# Patient Record
Sex: Female | Born: 1965
Health system: Southern US, Community
[De-identification: ages and names within clinical notes are randomized; demographics above are authoritative.]

## PROBLEM LIST (undated history)

## (undated) DIAGNOSIS — M1990 Unspecified osteoarthritis, unspecified site: Secondary | ICD-10-CM

## (undated) DIAGNOSIS — G510 Bell's palsy: Secondary | ICD-10-CM

## (undated) DIAGNOSIS — E669 Obesity, unspecified: Secondary | ICD-10-CM

## (undated) DIAGNOSIS — Z87442 Personal history of urinary calculi: Secondary | ICD-10-CM

## (undated) DIAGNOSIS — N189 Chronic kidney disease, unspecified: Secondary | ICD-10-CM

## (undated) DIAGNOSIS — K589 Irritable bowel syndrome without diarrhea: Secondary | ICD-10-CM

## (undated) DIAGNOSIS — D690 Allergic purpura: Secondary | ICD-10-CM

## (undated) DIAGNOSIS — Z8489 Family history of other specified conditions: Secondary | ICD-10-CM

## (undated) DIAGNOSIS — I10 Essential (primary) hypertension: Secondary | ICD-10-CM

## (undated) DIAGNOSIS — Z87440 Personal history of urinary (tract) infections: Secondary | ICD-10-CM

## (undated) DIAGNOSIS — G4730 Sleep apnea, unspecified: Secondary | ICD-10-CM

## (undated) HISTORY — DX: Personal history of urinary calculi: Z87.442

## (undated) HISTORY — DX: Essential (primary) hypertension: I10

## (undated) HISTORY — DX: Chronic kidney disease, unspecified: N18.9

## (undated) HISTORY — DX: Personal history of urinary (tract) infections: Z87.440

## (undated) HISTORY — DX: Obesity, unspecified: E66.9

## (undated) HISTORY — DX: Irritable bowel syndrome, unspecified: K58.9

## (undated) HISTORY — DX: Allergic purpura: D69.0

## (undated) HISTORY — PX: ACHILLES TENDON SURGERY: SHX542

## (undated) HISTORY — DX: Unspecified osteoarthritis, unspecified site: M19.90

## (undated) HISTORY — DX: Bell's palsy: G51.0

## (undated) HISTORY — PX: TONSILECTOMY, ADENOIDECTOMY, BILATERAL MYRINGOTOMY AND TUBES: SHX2538

## (undated) HISTORY — PX: KNEE SURGERY: SHX244

## (undated) HISTORY — PX: TONSILLECTOMY: SUR1361

---

## 1994-10-24 HISTORY — PX: TUBAL LIGATION: SHX77

## 1998-10-24 HISTORY — PX: NECK SURGERY: SHX720

## 1998-12-29 ENCOUNTER — Encounter: Payer: Self-pay | Admitting: Neurosurgery

## 1998-12-31 ENCOUNTER — Inpatient Hospital Stay (HOSPITAL_COMMUNITY): Admission: RE | Admit: 1998-12-31 | Discharge: 1999-01-01 | Payer: Self-pay | Admitting: Neurosurgery

## 1999-05-12 ENCOUNTER — Other Ambulatory Visit: Admission: RE | Admit: 1999-05-12 | Discharge: 1999-05-12 | Payer: Self-pay | Admitting: *Deleted

## 2001-08-28 ENCOUNTER — Other Ambulatory Visit: Admission: RE | Admit: 2001-08-28 | Discharge: 2001-08-28 | Payer: Self-pay | Admitting: Obstetrics and Gynecology

## 2001-10-24 HISTORY — PX: ABDOMINAL HYSTERECTOMY: SHX81

## 2001-11-14 ENCOUNTER — Encounter: Payer: Self-pay | Admitting: Internal Medicine

## 2001-11-14 ENCOUNTER — Emergency Department (HOSPITAL_COMMUNITY): Admission: EM | Admit: 2001-11-14 | Discharge: 2001-11-14 | Payer: Self-pay | Admitting: Internal Medicine

## 2001-12-10 ENCOUNTER — Emergency Department (HOSPITAL_COMMUNITY): Admission: EM | Admit: 2001-12-10 | Discharge: 2001-12-10 | Payer: Self-pay | Admitting: *Deleted

## 2002-03-07 ENCOUNTER — Observation Stay (HOSPITAL_COMMUNITY): Admission: RE | Admit: 2002-03-07 | Discharge: 2002-03-08 | Payer: Self-pay | Admitting: Obstetrics and Gynecology

## 2002-03-07 ENCOUNTER — Encounter (INDEPENDENT_AMBULATORY_CARE_PROVIDER_SITE_OTHER): Payer: Self-pay

## 2002-12-17 ENCOUNTER — Other Ambulatory Visit: Admission: RE | Admit: 2002-12-17 | Discharge: 2002-12-17 | Payer: Self-pay | Admitting: Obstetrics and Gynecology

## 2003-12-30 ENCOUNTER — Ambulatory Visit (HOSPITAL_COMMUNITY): Admission: RE | Admit: 2003-12-30 | Discharge: 2003-12-30 | Payer: Self-pay | Admitting: Family Medicine

## 2004-11-01 ENCOUNTER — Other Ambulatory Visit: Admission: RE | Admit: 2004-11-01 | Discharge: 2004-11-01 | Payer: Self-pay | Admitting: Obstetrics and Gynecology

## 2005-05-05 ENCOUNTER — Ambulatory Visit (HOSPITAL_COMMUNITY): Admission: RE | Admit: 2005-05-05 | Discharge: 2005-05-05 | Payer: Self-pay | Admitting: Family Medicine

## 2005-09-06 ENCOUNTER — Emergency Department (HOSPITAL_COMMUNITY): Admission: EM | Admit: 2005-09-06 | Discharge: 2005-09-06 | Payer: Self-pay | Admitting: Emergency Medicine

## 2006-09-15 ENCOUNTER — Ambulatory Visit (HOSPITAL_COMMUNITY): Admission: RE | Admit: 2006-09-15 | Discharge: 2006-09-15 | Payer: Self-pay | Admitting: Family Medicine

## 2006-12-09 ENCOUNTER — Emergency Department (HOSPITAL_COMMUNITY): Admission: EM | Admit: 2006-12-09 | Discharge: 2006-12-09 | Payer: Self-pay | Admitting: Emergency Medicine

## 2008-01-21 ENCOUNTER — Inpatient Hospital Stay (HOSPITAL_COMMUNITY): Admission: EM | Admit: 2008-01-21 | Discharge: 2008-01-22 | Payer: Self-pay | Admitting: Emergency Medicine

## 2008-01-21 ENCOUNTER — Ambulatory Visit: Payer: Self-pay | Admitting: Cardiology

## 2008-01-29 ENCOUNTER — Encounter: Payer: Self-pay | Admitting: Cardiology

## 2008-01-29 ENCOUNTER — Ambulatory Visit (HOSPITAL_COMMUNITY): Admission: RE | Admit: 2008-01-29 | Discharge: 2008-01-29 | Payer: Self-pay | Admitting: Cardiology

## 2008-02-12 ENCOUNTER — Ambulatory Visit: Payer: Self-pay | Admitting: Cardiology

## 2008-02-27 ENCOUNTER — Ambulatory Visit (HOSPITAL_COMMUNITY): Admission: RE | Admit: 2008-02-27 | Discharge: 2008-02-27 | Payer: Self-pay | Admitting: Cardiology

## 2008-05-08 ENCOUNTER — Emergency Department (HOSPITAL_COMMUNITY): Admission: EM | Admit: 2008-05-08 | Discharge: 2008-05-08 | Payer: Self-pay | Admitting: Emergency Medicine

## 2009-04-21 ENCOUNTER — Encounter: Admission: RE | Admit: 2009-04-21 | Discharge: 2009-04-21 | Payer: Self-pay | Admitting: Neurosurgery

## 2009-06-06 ENCOUNTER — Emergency Department (HOSPITAL_COMMUNITY): Admission: EM | Admit: 2009-06-06 | Discharge: 2009-06-06 | Payer: Self-pay | Admitting: Emergency Medicine

## 2009-08-23 ENCOUNTER — Emergency Department (HOSPITAL_COMMUNITY): Admission: EM | Admit: 2009-08-23 | Discharge: 2009-08-23 | Payer: Self-pay | Admitting: Emergency Medicine

## 2009-12-01 ENCOUNTER — Encounter: Admission: RE | Admit: 2009-12-01 | Discharge: 2009-12-01 | Payer: Self-pay | Admitting: Obstetrics and Gynecology

## 2010-05-25 ENCOUNTER — Ambulatory Visit (HOSPITAL_COMMUNITY): Admission: RE | Admit: 2010-05-25 | Discharge: 2010-05-25 | Payer: Self-pay | Admitting: Physician Assistant

## 2010-06-29 ENCOUNTER — Ambulatory Visit (HOSPITAL_COMMUNITY): Admission: RE | Admit: 2010-06-29 | Discharge: 2010-06-29 | Payer: Self-pay | Admitting: Family Medicine

## 2010-07-01 ENCOUNTER — Ambulatory Visit (HOSPITAL_COMMUNITY): Admission: RE | Admit: 2010-07-01 | Discharge: 2010-07-01 | Payer: Self-pay | Admitting: Family Medicine

## 2010-11-19 ENCOUNTER — Emergency Department (HOSPITAL_COMMUNITY)
Admission: EM | Admit: 2010-11-19 | Discharge: 2010-11-20 | Payer: Self-pay | Source: Home / Self Care | Admitting: Emergency Medicine

## 2010-11-19 LAB — CBC
HCT: 42 % (ref 36.0–46.0)
MCV: 88.6 fL (ref 78.0–100.0)
Platelets: 317 10*3/uL (ref 150–400)
RBC: 4.74 MIL/uL (ref 3.87–5.11)
WBC: 12.4 10*3/uL — ABNORMAL HIGH (ref 4.0–10.5)

## 2010-11-19 LAB — DIFFERENTIAL
Basophils Absolute: 0.1 10*3/uL (ref 0.0–0.1)
Lymphocytes Relative: 28 % (ref 12–46)
Lymphs Abs: 3.5 10*3/uL (ref 0.7–4.0)
Neutrophils Relative %: 62 % (ref 43–77)

## 2010-11-19 LAB — COMPREHENSIVE METABOLIC PANEL
BUN: 12 mg/dL (ref 6–23)
CO2: 26 mEq/L (ref 19–32)
Calcium: 9.2 mg/dL (ref 8.4–10.5)
Creatinine, Ser: 0.71 mg/dL (ref 0.4–1.2)
GFR calc non Af Amer: >60 mL/min (ref >60)
Glucose, Bld: 122 mg/dL — ABNORMAL HIGH (ref 70–99)

## 2010-11-19 LAB — URINALYSIS, ROUTINE W REFLEX MICROSCOPIC
Bilirubin Urine: NEGATIVE
Nitrite: NEGATIVE
Specific Gravity, Urine: 1.014 (ref 1.005–1.030)
pH: 6 (ref 5.0–8.0)

## 2010-11-19 LAB — PROTIME-INR: Prothrombin Time: 13 seconds (ref 11.6–15.2)

## 2010-11-19 LAB — URINE MICROSCOPIC-ADD ON

## 2010-11-26 ENCOUNTER — Observation Stay (HOSPITAL_COMMUNITY)
Admission: EM | Admit: 2010-11-26 | Discharge: 2010-11-28 | DRG: 813 | Disposition: A | Discharge status: Home / Self Care | Payer: Medicare HMO | Attending: Internal Medicine | Admitting: Internal Medicine

## 2010-11-26 ENCOUNTER — Emergency Department (HOSPITAL_COMMUNITY): Payer: Medicare HMO

## 2010-11-26 DIAGNOSIS — Z79899 Other long term (current) drug therapy: Secondary | ICD-10-CM | POA: Insufficient documentation

## 2010-11-26 DIAGNOSIS — R197 Diarrhea, unspecified: Secondary | ICD-10-CM | POA: Insufficient documentation

## 2010-11-26 DIAGNOSIS — Z87440 Personal history of urinary (tract) infections: Secondary | ICD-10-CM

## 2010-11-26 DIAGNOSIS — G2581 Restless legs syndrome: Secondary | ICD-10-CM | POA: Diagnosis present

## 2010-11-26 DIAGNOSIS — K921 Melena: Secondary | ICD-10-CM | POA: Diagnosis present

## 2010-11-26 DIAGNOSIS — D72829 Elevated white blood cell count, unspecified: Secondary | ICD-10-CM | POA: Diagnosis present

## 2010-11-26 DIAGNOSIS — E669 Obesity, unspecified: Secondary | ICD-10-CM | POA: Diagnosis present

## 2010-11-26 DIAGNOSIS — T380X5A Adverse effect of glucocorticoids and synthetic analogues, initial encounter: Secondary | ICD-10-CM | POA: Diagnosis present

## 2010-11-26 DIAGNOSIS — R109 Unspecified abdominal pain: Secondary | ICD-10-CM | POA: Insufficient documentation

## 2010-11-26 DIAGNOSIS — D690 Allergic purpura: Principal | ICD-10-CM | POA: Diagnosis present

## 2010-11-26 DIAGNOSIS — I10 Essential (primary) hypertension: Secondary | ICD-10-CM | POA: Diagnosis present

## 2010-11-26 DIAGNOSIS — Z87442 Personal history of urinary calculi: Secondary | ICD-10-CM

## 2010-11-26 DIAGNOSIS — Z880 Allergy status to penicillin: Secondary | ICD-10-CM

## 2010-11-26 DIAGNOSIS — M1990 Unspecified osteoarthritis, unspecified site: Secondary | ICD-10-CM | POA: Diagnosis present

## 2010-11-26 DIAGNOSIS — F411 Generalized anxiety disorder: Secondary | ICD-10-CM | POA: Diagnosis present

## 2010-11-26 LAB — DIFFERENTIAL
Basophils Absolute: 0 10*3/uL (ref 0.0–0.1)
Basophils Relative: 0 % (ref 0–1)
Lymphocytes Relative: 15 % (ref 12–46)
Neutro Abs: 15.6 10*3/uL — ABNORMAL HIGH (ref 1.7–7.7)
Neutrophils Relative %: 78 % — ABNORMAL HIGH (ref 43–77)

## 2010-11-26 LAB — POCT I-STAT, CHEM 8
Calcium, Ion: 1.13 mmol/L (ref 1.12–1.32)
Creatinine, Ser: 0.8 mg/dL (ref 0.4–1.2)
Glucose, Bld: 107 mg/dL — ABNORMAL HIGH (ref 70–99)
Hemoglobin: 16.3 g/dL — ABNORMAL HIGH (ref 12.0–15.0)
Sodium: 137 mEq/L (ref 135–145)
TCO2: 23 mmol/L (ref 0–100)

## 2010-11-26 LAB — HEPATIC FUNCTION PANEL
AST: 14 U/L (ref 0–37)
Albumin: 3.5 g/dL (ref 3.5–5.2)

## 2010-11-26 LAB — URINALYSIS, ROUTINE W REFLEX MICROSCOPIC
Leukocytes, UA: NEGATIVE
Protein, ur: 100 mg/dL — AB
Specific Gravity, Urine: 1.026 (ref 1.005–1.030)
Urine Glucose, Fasting: >1000 mg/dL — AB
Urobilinogen, UA: 0.2 mg/dL (ref 0.0–1.0)

## 2010-11-26 LAB — URINE MICROSCOPIC-ADD ON

## 2010-11-26 LAB — CBC
HCT: 44.1 % (ref 36.0–46.0)
Hemoglobin: 15.2 g/dL — ABNORMAL HIGH (ref 12.0–15.0)
RBC: 5.06 MIL/uL (ref 3.87–5.11)
WBC: 19.9 10*3/uL — ABNORMAL HIGH (ref 4.0–10.5)

## 2010-11-26 LAB — PROTIME-INR
INR: 0.99 (ref 0.00–1.49)
Prothrombin Time: 13.3 seconds (ref 11.6–15.2)

## 2010-11-26 LAB — APTT: aPTT: 29 seconds (ref 24–37)

## 2010-11-27 LAB — CBC
HCT: 44.7 % (ref 36.0–46.0)
Hemoglobin: 15.2 g/dL — ABNORMAL HIGH (ref 12.0–15.0)
MCH: 29.7 pg (ref 26.0–34.0)
MCHC: 34 g/dL (ref 30.0–36.0)
MCV: 87.5 fL (ref 78.0–100.0)
RDW: 13.9 % (ref 11.5–15.5)

## 2010-11-27 LAB — DIFFERENTIAL
Basophils Absolute: 0 10*3/uL (ref 0.0–0.1)
Eosinophils Relative: 0 % (ref 0–5)
Lymphocytes Relative: 10 % — ABNORMAL LOW (ref 12–46)
Monocytes Absolute: 0.6 10*3/uL (ref 0.1–1.0)
Monocytes Relative: 2 % — ABNORMAL LOW (ref 3–12)
Neutro Abs: 20.9 10*3/uL — ABNORMAL HIGH (ref 1.7–7.7)

## 2010-11-27 LAB — COMPREHENSIVE METABOLIC PANEL
ALT: 24 U/L (ref 0–35)
BUN: 16 mg/dL (ref 6–23)
CO2: 22 mEq/L (ref 19–32)
Calcium: 9.2 mg/dL (ref 8.4–10.5)
Creatinine, Ser: 0.77 mg/dL (ref 0.4–1.2)
GFR calc non Af Amer: >60 mL/min (ref >60)
Glucose, Bld: 153 mg/dL — ABNORMAL HIGH (ref 70–99)
Total Protein: 7.2 g/dL (ref 6.0–8.3)

## 2010-11-27 LAB — C-REACTIVE PROTEIN: CRP: 1.1 mg/dL — ABNORMAL HIGH (ref <0.6)

## 2010-11-27 LAB — GLUCOSE, CAPILLARY
Glucose-Capillary: 166 mg/dL — ABNORMAL HIGH (ref 70–99)
Glucose-Capillary: 97 mg/dL (ref 70–99)
Glucose-Capillary: 205 mg/dL — ABNORMAL HIGH (ref 70–99)

## 2010-11-27 LAB — PHOSPHORUS: Phosphorus: 3.6 mg/dL (ref 2.3–4.6)

## 2010-11-27 LAB — TSH: TSH: 1.738 u[IU]/mL (ref 0.350–4.500)

## 2010-11-28 LAB — URINE CULTURE: Colony Count: 35000

## 2010-11-28 LAB — GLUCOSE, CAPILLARY: Glucose-Capillary: 213 mg/dL — ABNORMAL HIGH (ref 70–99)

## 2010-12-01 LAB — CRYOGLOBULIN

## 2010-12-05 NOTE — H&P (Addendum)
Victoria Williams, Victoria Williams                ACCOUNT NO.:  [id number]  MEDICAL RECORD NO.:  [id number]           PATIENT TYPE:  I  LOCATION:  1305                         FACILITY:  [facility]  PHYSICIAN:  Massie Maroon, MD        DATE OF BIRTH:  27-Aug-1966  DATE OF ADMISSION:  11/26/2010 DATE OF DISCHARGE:                             HISTORY & PHYSICAL   PRIMARY CARE PHYSICIAN:  Corrie Mckusick, MD, Family Practice.  NEPHROLOGIST:  Duke Salvia. Eliott Nine, MD  DERMATOLOGIST:  Leticia Clas, MD, in [place].  CHIEF COMPLAINT:  Rash on my legs and arms.  HISTORY OF PRESENT ILLNESS:  The patient is a 45 year old female who states "I was advised to come to the ER because my dermatologist and my family doctor decided I need to be further evaluated because I have the skin problems."  The patient states that her symptoms started about 4 weeks ago.  The patient states "I started off with cold symptoms with a stuffy nose, then about a week and half later my stomach started aching, I started having cramps, noticed within the next 2 weeks I have started developing the skin rash on my lower extremities.  I did go see Dr. Phillips Odor about a week and half ago and at that point Dr. Phillips Odor started me on steroids and referred me to dermatologist, Dr. Margo Aye, in [place].  I saw Dr. Margo Aye on Tuesday, February 1st, and Dr. Margo Aye did a biopsy on me, kept me on steroids, and sent me home.  At that time, I noticed that I had some swelling in my extremities.  After being on the steroids for about approximately 2 weeks, my swelling in the extremities seems to improve.  The dermatologist also did a biopsy twice and told me that I had diagnosis of Henoch-Schonlein purpura. I gave Z-PAK and mostly for a 5-day course.  On Wednesday, the patient states that she went to see her nephrologist, Dr. Eliott Nine, because she has a history of pyelonephritis and she always has blood and protein in her urine. The patient states that she also  had symptoms of fever, chills, light headache, she had nasal stuffy nose.  She also notes she has some nasal bleeding when she blow her nose, however, that has resolved.  She also states that she has some bright red blood per rectum when she wipes, however, she states she has also been on [facility] for approximately a couple of months.  The patient states "I think my symptoms were improving, however, after speaking with my doctors and they felt it was best for me to come to the hospital because they felt as though that I could be better worked up here at the hospital.  The patient is alert and oriented x3.  Currently, denies any chest pain, any shortness of breath, any dizziness, any urinary burning, any frequency. The patient also denies any heat, cold, intolerance to self.  The patient also reports that she has a history of osteoarthritis and she was having joint swelling and joint pain and that is why she was on MOBIC.  PAST MEDICAL  HISTORY:   1. Hypertension. 2. Kidney stones x6. 3. Wrist injury. 4. Anxiety. 5. Pyelonephritis since age 45. 6. Obesity. 7. Osteoarthritis. 8. Restless legs syndrome. 9. Incontinence. 10.Urinary tract infection.  SURGICAL HISTORY:  Back surgery, C-section, hysterectomy, right knee arthroscopy, tonsillectomy, and tubal ligation.  ALLERGIES: 1. SULFA, KEFLEX, SEPTRA unknown causes. 2. PENICILLIN causes hives. 3. LEVAQUIN causes swelling and shortness of breath.  CURRENT MEDICATIONS: 1. Azithromycin 250 mg, the patient has 1 dose left remaining. 2. Prednisone 20 mg 3 tablets daily. 3. Meloxicam 15 mg 1 tablet evert morning. 4. Vesicare 10 mg 1 tablet q.a.m. 5. Mirapex 0.25 mg at bedtime. 6. HCTZ 25 mg daily.  SOCIAL HISTORY:  The patient lives in [place].  She is married, with 3 children.  She denies any alcohol, drug, or tobacco abuse.  FAMILY HISTORY:  Mother has had 3 hip replacements, stroke.  Father died at age 72 of heart attack.  She  has 1 living sibling.  CODE STATUS:  Full code.  REVIEW OF SYSTEMS:  All systems have been reviewed and are negative, otherwise stated HPI.  PHYSICAL EXAMINATION:  VITAL SIGNS:  Temperature is 98.5, respirations 20, pulse 95, blood pressure 119/79, current weight 233.74 pounds, currently room air at 95%. GENERAL:  Very pleasant, 45 year old, obese female lying in bed, in no acute distress, answering questions appropriately. HEENT:  Head is atraumatic.  PERRLA.  Moist mucous membranes. NECK:  Supple.  No JVD.  No thyromegaly. LUNGS:  Lungs are clear to auscultation bilaterally.  No wheezing or rhonchi. HEART:  Regular rate and rhythm.  No heaves, no thrills noted. ABDOMEN:  Soft, obese, positive bowel sounds.  No guarding.  No tenderness noted.  The patient does have some petechiae noted in the lower quad regions. SKIN:  Warm and dry.  The patient does have some petechiae noted on the bilateral lower extremities and on the left upper extremity. EXTREMITIES:  Movement x4.  No edema noted. NEUROLOGIC/PSYCHIATRIC:  Alert and oriented x3.  Cranial nerves appear to be intact.  Muscle strength 3/5 throughout.  LABORATORY DATA:  Abdomen:  Acute abdomen. 1. No acute cardiopulmonary disease.  No acute intra-abdominal     findings. 2. No significant change from prior.  Urine microscopic is negative.     Urinalysis, moderate amount of blood, protein is 100, nitrites     negative, leukocytes negative.  Hepatic function is unremarkable.     PTT is 29, PT is 13.3, INR is 0.99.  WBC is 19.9, hemoglobin 15.2,     hematocrit 44.1, platelet is 443.  Urine pregnancy is negative.     Sodium is 137, potassium 4.1, chloride is 103, glucose 107, BUN 15,     creatinine 0.8.  ASSESSMENT AND PLAN: 1. A 45 year old female presenting with Henoch-Schonlein purpura     versus hypersensitivity vasculitis along with leukocytosis.  The     patient will be admitted to [facility] [place] [facility].  The patient's      leukocytosis is probably related to her steroid use.  Clinically,     the patient is asymptomatic and vital signs are stable, however, we     will continue to monitor via labs.  It is also known the patient     did have recurrent biopsies by dermatologist for the diagnosis of     Henoch-Schonlein purpura.  Therefore, we would check ESR,, CRP,     cryoglobulinemia, hepatitis panel, RA panel, c-ANCA, p-ANCA.  The     patient will continue  on her current regimen of steroids. The     patient will be hydrated with normal saline at 75 mL an hour. 2. Hematochezia.  We will heme check stools.  The patient's hemoglobin     is currently stable at 15.2.  We will continue to monitor via labs. 3. Hypertension.  The patient is currently stable on current regimen,     therefore we will continue her blood pressure medicines. 4. Restless legs syndrome.  The patient is currently on Mirapex.  We     will continue current regimen. 5. Obesity.  The patient's current weight is 233.7 pounds.     Prophylactically, the patient will be placed on SCDs. 6. The patient is a full code. 7. I spoke with Dr. Pearson Grippe regarding history physical, assessment,     and plan.  Dr. Pearson Grippe has also examined the patient and these     are his above findings.  The above interventions are embarked upon     and we will let the hospital course to determine further action.    ______________________________ Anice Paganini, NP   ______________________________ Massie Maroon, MD    IB/MEDQ  D:  11/27/2010  T:  11/27/2010  Job:  [id number]  Electronically Signed by Anice Paganini  on 12/02/2010 11:18:20 PM Electronically Signed by Pearson Grippe MD on 12/15/2010 01:30:13 PM

## 2010-12-07 NOTE — Discharge Summary (Signed)
Victoria Williams, APPLE                ACCOUNT NO.:  [id number]  MEDICAL RECORD NO.:  [id number]           PATIENT TYPE:  I  LOCATION:  1305                         [facility]AC[facility]  [facility]  PHYSICIAN:  Erick Blinks, MD     DATE OF BIRTH:  [birth year]  DATE OF ADMISSION:  11/26/2010 DATE OF DISCHARGE:  11/28/2010                              DISCHARGE SUMMARY   PRIMARY CARE PHYSICIAN:  Victoria Williams, M.D.  DERMATOLOGIST:  Victoria Williams, M.D.  NEPHROLOGIST:  Victoria Williams, M.D.  RHEUMATOLOGIST:  Victoria Williams, M.D.  DISCHARGE DIAGNOSES: 1. Recently diagnosed Henoch-Schonlein purpura. 2. Skin rash secondary to Henoch-Schonlein purpura. 3. Abdominal pain secondary to Henoch-Schonlein purpura, improved. 4. Mild hematochezia with heme-negative stools and stable hemoglobin,     resolved. 5. Hypertension. 6. Restless legs syndrome. 7. Obesity. 8. Anxiety. 9. History of kidney stones. 10.Steroid-induced leukocytosis.  DISCHARGE MEDICATIONS: 1. Hydrochlorothiazide 25 mg 1 tablet p.o. daily. 2. VESIcare 10 mg 1 tablet p.o. q.a.m. 3. Prednisone 60 mg p.o. daily. 4. Mirapex 0.25 mg 3 tablets p.o. at bedtime.  ADMISSION HISTORY:  This is a 45 year old female who was recently diagnosed with Henoch-Schonlein purpura with skin biopsy performed by her dermatologist.  She was having lower extremity rash which was continuing to get worse.  She was also having abdominal cramping and inability to tolerate anything orally and was referred to the emergency room by her primary care physician for further management.  She also reported some slight hematochezia which was noticed on wiping but that has been intermittent.  She also noticed some swelling in her lower extremities which was associated with the rash.  She has recently been on and off of high-dose steroids which were being tapered off.  She had noted as the steroids were coming down, her symptoms were getting worse. Due to the  constellation of these symptoms, the patient was referred for admission.  HOSPITAL COURSE:  Rash.  The patient has a known diagnosis of Henoch- Schonlein purpura, and her rash is consistent with this.  In fact, all of her symptoms that led to her presentation are consistent with this diagnosis.  A complete workup including ESR, CRP, cryoglobulin, hepatitis panel, RA panel, C-ANCA AND P-ANCA were sent here in the hospital.  The majority of these tests are currently pending.  The patient reports that she has seen Dr. Dierdre Forth in the past from rheumatology.  At this time, her abdominal pain has resolved which is again likely secondary to Henoch-Schonlein purpura.  Her rash has improved as has the swelling in her legs.  She does not have any significant joint pain.  She is able to tolerate p.o. and keep herself hydrated.  At this point, I do not see any further benefit of inpatient hospital stay.  She is recommended to continue on the high-dose steroids as prednisone 60 mg daily until she follows up with Dr. Dierdre Forth later this week.  She was also set to see Dr. Eliott Williams in her office as well for further followup.  The remainder of her medical problems have been stable, and the patient is ready to be discharged  home.  CONDITION AT DISCHARGE:  Improved.  DIAGNOSTIC IMAGING:  Acute abdominal series done on November 26, 2010, shows no active cardiopulmonary disease.  No acute intra-abdominal finding.  No significant change from prior.  CONSULTATIONS:  None.  PROCEDURES:  None.  DISCHARGE INSTRUCTIONS:  The patient should continue on a heart-healthy diet, conduct her activity as tolerated.  She is scheduled to see Dr. Eliott Williams on Thursday.  She will schedule an appointment with Dr. Dierdre Forth for later this week, and she should see Dr. Phillips Odor in the next 1-2 weeks.  Plan was discussed with the patient who was also in agreement.  TIME SPENT:  Time spent on this discharge is 40  minutes.     Erick Blinks, MD     JM/MEDQ  D:  11/28/2010  T:  11/28/2010  Job:  [id number]  cc:   Victoria Williams, M.D. Fax: [phone]  Mertha Finders., M.D. Fax: [phone]  Victoria Salvia Eliott Williams, M.D. Fax: [phone]  Victoria Deeds, MD Fax: [phone]  Electronically Signed by Erick Blinks  on 12/07/2010 09:41:32 PM

## 2010-12-26 ENCOUNTER — Emergency Department (HOSPITAL_COMMUNITY)
Admission: EM | Admit: 2010-12-26 | Discharge: 2010-12-26 | Disposition: A | Discharge status: Home / Self Care | Payer: Medicare HMO | Attending: Emergency Medicine | Admitting: Emergency Medicine

## 2010-12-26 ENCOUNTER — Emergency Department (HOSPITAL_COMMUNITY): Payer: Medicare HMO

## 2010-12-26 DIAGNOSIS — I10 Essential (primary) hypertension: Secondary | ICD-10-CM | POA: Insufficient documentation

## 2010-12-26 DIAGNOSIS — I498 Other specified cardiac arrhythmias: Secondary | ICD-10-CM | POA: Insufficient documentation

## 2010-12-26 DIAGNOSIS — R1013 Epigastric pain: Secondary | ICD-10-CM | POA: Insufficient documentation

## 2010-12-26 DIAGNOSIS — Z79899 Other long term (current) drug therapy: Secondary | ICD-10-CM | POA: Insufficient documentation

## 2010-12-26 LAB — COMPREHENSIVE METABOLIC PANEL
ALT: 26 U/L (ref 0–35)
AST: 22 U/L (ref 0–37)
Albumin: 3.6 g/dL (ref 3.5–5.2)
Alkaline Phosphatase: 61 U/L (ref 39–117)
CO2: 20 mEq/L (ref 19–32)
Chloride: 99 mEq/L (ref 96–112)
GFR calc Af Amer: >60 mL/min (ref >60)
GFR calc non Af Amer: >60 mL/min (ref >60)
Potassium: 4.8 mEq/L (ref 3.5–5.1)
Sodium: 133 mEq/L — ABNORMAL LOW (ref 135–145)
Total Bilirubin: 0.7 mg/dL (ref 0.3–1.2)

## 2010-12-26 LAB — DIFFERENTIAL
Basophils Relative: 0 % (ref 0–1)
Eosinophils Absolute: 0 10*3/uL (ref 0.0–0.7)
Eosinophils Relative: 0 % (ref 0–5)
Monocytes Relative: 3 % (ref 3–12)
Neutrophils Relative %: 87 % — ABNORMAL HIGH (ref 43–77)

## 2010-12-26 LAB — CBC
MCH: 28.8 pg (ref 26.0–34.0)
Platelets: 437 10*3/uL — ABNORMAL HIGH (ref 150–400)
RBC: 5.32 MIL/uL — ABNORMAL HIGH (ref 3.87–5.11)
RDW: 15.1 % (ref 11.5–15.5)

## 2010-12-26 LAB — URINALYSIS, ROUTINE W REFLEX MICROSCOPIC
Bilirubin Urine: NEGATIVE
Glucose, UA: 500 mg/dL — AB
Protein, ur: 100 mg/dL — AB
Urobilinogen, UA: 0.2 mg/dL (ref 0.0–1.0)

## 2010-12-26 LAB — URINE MICROSCOPIC-ADD ON

## 2010-12-26 LAB — POCT CARDIAC MARKERS: Troponin i, poc: <0.05 ng/mL (ref 0.00–0.09)

## 2010-12-26 LAB — GLUCOSE, CAPILLARY: Glucose-Capillary: 235 mg/dL — ABNORMAL HIGH (ref 70–99)

## 2010-12-26 LAB — APTT: aPTT: 29 seconds (ref 24–37)

## 2011-01-18 ENCOUNTER — Encounter: Payer: Self-pay | Admitting: Gastroenterology

## 2011-01-25 NOTE — Letter (Signed)
Summary: New Patient letter  [facility]  [address] [place], [place] [address]   Phone: [phone]  Fax: [phone]       01/18/2011 MRN: [id number]  Victoria Williams [address] [place], [place]  [address]  Dear Victoria Williams,  Welcome to the Gastroenterology Division at [facility].    You are scheduled to see Dr.  Arlyce Dice on 02-10-11 at 8:30am on the 3rd floor at [facility], [address]  We ask that you try to arrive at our office 15 minutes prior to your appointment time to allow for check-in.  We would like you to complete the enclosed self-administered evaluation form prior to your visit and bring it with you on the day of your appointment.  We will review it with you.  Also, please bring a complete list of all your medications or, if you prefer, bring the medication bottles and we will list them.  Please bring your insurance card so that we may make a copy of it.  If your insurance requires a referral to see a specialist, please bring your referral form from your primary care physician.  Co-payments are due at the time of your visit and may be paid by cash, check or credit card.     Your office visit will consist of a consult with your physician (includes a physical exam), any laboratory testing he/she may order, scheduling of any necessary diagnostic testing (e.g. x-ray, ultrasound, CT-scan), and scheduling of a procedure (e.g. Endoscopy, Colonoscopy) if required.  Please allow enough time on your schedule to allow for any/all of these possibilities.    If you cannot keep your appointment, please call [phone] to cancel or reschedule prior to your appointment date.  This allows [place] the opportunity to schedule an appointment for another patient in need of care.  If you do not cancel or reschedule by 5 p.m. the business day prior to your appointment date, you will be charged a $50.00 late cancellation/no-show fee.    Thank you for choosing [facility] for your medical needs.  We appreciate the opportunity to care for you.  Please visit [place] at our website  to learn more about our practice.                     Sincerely,                                                             The Gastroenterology Division

## 2011-02-04 ENCOUNTER — Other Ambulatory Visit: Payer: Self-pay | Admitting: Nephrology

## 2011-02-04 ENCOUNTER — Ambulatory Visit
Admission: RE | Admit: 2011-02-04 | Discharge: 2011-02-04 | Disposition: A | Discharge status: Home / Self Care | Payer: Medicare HMO | Source: Ambulatory Visit | Attending: Nephrology | Admitting: Nephrology

## 2011-02-04 DIAGNOSIS — R52 Pain, unspecified: Secondary | ICD-10-CM

## 2011-02-10 ENCOUNTER — Encounter: Payer: Self-pay | Admitting: Gastroenterology

## 2011-02-10 ENCOUNTER — Ambulatory Visit (INDEPENDENT_AMBULATORY_CARE_PROVIDER_SITE_OTHER): Payer: Medicare HMO | Admitting: Gastroenterology

## 2011-02-10 DIAGNOSIS — D690 Allergic purpura: Secondary | ICD-10-CM

## 2011-02-10 DIAGNOSIS — R109 Unspecified abdominal pain: Secondary | ICD-10-CM

## 2011-02-10 DIAGNOSIS — E119 Type 2 diabetes mellitus without complications: Secondary | ICD-10-CM

## 2011-02-10 DIAGNOSIS — R1013 Epigastric pain: Secondary | ICD-10-CM

## 2011-02-10 NOTE — Assessment & Plan Note (Signed)
Upper abdominal pain with history of melena raises the concern for active peptic ulcer disease. Risk factors include prednisone use. Her persistent leukocytosis suggest an active inflammatory component. Prior CT scan was negative for other intra-abdominal pathology.  Reformations. #1 continue omeprazole. #2 upper endoscopy  Risks, alternatives, and complications of the procedure, including bleeding, perforation, and possible need for surgery, were explained to the patient.  Patient's questions were answered.

## 2011-02-10 NOTE — Progress Notes (Signed)
History of Present Illness:  Victoria Williams is a 45 year old white female with H-S purpura , referred at the request of Dr. Phillips Odor and Eliott Nine for evaluation of abdominal pain. She was diagnosed with H-S purpura approximately 4 months ago, and was placed on steroids. For the last 6 weeks she has been complaining of subxiphoid sharp pain. Pain may at times radiate to the back. It is spontaneous and worsened with eating. It is sometimes accompanied by nausea. Approximately 2 weeks ago she had an episode of frank tarry stools. She was placed on omeprazole. CT scan in early March was unrevealing except for a renal calculus. Lab work has been pertinent for white counts as high as 20,000. Hemoglobin and LFTs have been normal. She is on no gastric irritants including nonsteroidals. She has been taking prednisone for several months.  Colonoscopy approximately 5 years ago apparently was negative.  The patient is a non-insulin-dependent diabetic.        Review of Systems: She has occasional joint pains. She complains primarily of fatigue. Pertinent positive and negative review of systems were noted in the above HPI section. All other review of systems were otherwise negative.    Current Medications, Allergies, Past Medical History, Past Surgical History, Family History and Social History were reviewed in [employer] electronic medical record  Vital signs were reviewed in today's medical record. Physical Exam: General: Well developed , well nourished, no acute distress Head: Normocephalic and atraumatic Eyes:  sclerae anicteric, EOMI Ears: Normal auditory acuity Mouth: No deformity or lesions Lungs: Clear throughout to auscultation Heart: Regular rate and rhythm; no murmurs, rubs or bruits Abdomen: Soft and non distended. No masses, hepatosplenomegaly or hernias noted. Normal Bowel sounds; there is minimal tenderness in the subxiphoid area Rectal:deferred Musculoskeletal: Symmetrical with no gross  deformities  Pulses:  Normal pulses noted Extremities: No clubbing, cyanosis, edema or deformities noted Neurological: Alert oriented x 4, grossly nonfocal Psychological:  Alert and cooperative. Normal mood and affect

## 2011-02-10 NOTE — Patient Instructions (Addendum)
Upper GI Endoscopy Upper GI endoscopy means using a flexible scope to look at the esophagus, stomach and upper small bowel. This is done to make a diagnosis in people with heartburn, abdominal pain, or abnormal bleeding. Sometimes an endoscope is needed to remove foreign bodies or food that become stuck in the esophagus; it can also be used to take biopsy samples. For the best results, do not eat or drink for 8 hours before having your upper endoscopy.  To perform the endoscopy, you will probably be sedated and your throat will be numbed with a special spray. The endoscope is then slowly passed down your throat (this will not interfere with your breathing). An endoscopy exam takes 15-30 minutes to complete and there is no real pain. Patients rarely remember much about the procedure. The results of the test may take several days if a biopsy or other test is taken.  You may have a sore throat after an endoscopy exam. Serious complications are very rare. Stick to liquids and soft foods until your pain is better. You should not drive a car or operate any dangerous equipment for at least 24 hours after being sedated. SEEK IMMEDIATE MEDICAL CARE IF:  You have severe throat pain.   You have shortness of breath.   You have bleeding problems.   You have a fever.   You have difficulty recovering from your sedation.  Document Released: 11/17/2004 Document Re-Released: 01/04/2010 [facility] Patient Information 2011 [place], [place].  Your EGD is scheduled on 03/08/2011 at 4pm Cc.John Golging,MD Cc: Camille Bal, M.D.

## 2011-02-11 ENCOUNTER — Encounter: Payer: Self-pay | Admitting: Gastroenterology

## 2011-02-14 ENCOUNTER — Encounter: Payer: Self-pay | Admitting: Gastroenterology

## 2011-02-27 ENCOUNTER — Emergency Department (HOSPITAL_COMMUNITY)
Admission: EM | Admit: 2011-02-27 | Discharge: 2011-02-27 | Disposition: A | Discharge status: Home / Self Care | Payer: Medicare HMO | Attending: Emergency Medicine | Admitting: Emergency Medicine

## 2011-02-27 DIAGNOSIS — Z8711 Personal history of peptic ulcer disease: Secondary | ICD-10-CM | POA: Insufficient documentation

## 2011-02-27 DIAGNOSIS — Z79899 Other long term (current) drug therapy: Secondary | ICD-10-CM | POA: Insufficient documentation

## 2011-02-27 DIAGNOSIS — I10 Essential (primary) hypertension: Secondary | ICD-10-CM | POA: Insufficient documentation

## 2011-02-27 DIAGNOSIS — R1013 Epigastric pain: Secondary | ICD-10-CM | POA: Insufficient documentation

## 2011-02-27 DIAGNOSIS — N289 Disorder of kidney and ureter, unspecified: Secondary | ICD-10-CM | POA: Insufficient documentation

## 2011-03-08 ENCOUNTER — Encounter: Payer: Self-pay | Admitting: Gastroenterology

## 2011-03-08 ENCOUNTER — Ambulatory Visit (AMBULATORY_SURGERY_CENTER): Payer: Medicare HMO | Admitting: Gastroenterology

## 2011-03-08 DIAGNOSIS — K208 Other esophagitis without bleeding: Secondary | ICD-10-CM

## 2011-03-08 DIAGNOSIS — R079 Chest pain, unspecified: Secondary | ICD-10-CM

## 2011-03-08 LAB — GLUCOSE, CAPILLARY: Glucose-Capillary: 128 mg/dL — ABNORMAL HIGH (ref 70–99)

## 2011-03-08 MED ORDER — DEXLANSOPRAZOLE 60 MG PO CPDR: 60.0000 mg | DELAYED_RELEASE_CAPSULE | Freq: Two times a day (BID) | ORAL | Status: DC

## 2011-03-08 MED ORDER — HYOSCYAMINE SULFATE 0.125 MG SL SUBL: 0.2500 mg | SUBLINGUAL_TABLET | SUBLINGUAL | Status: DC | PRN

## 2011-03-08 MED ORDER — SODIUM CHLORIDE 0.9 % IV SOLN: 500.0000 mL | INTRAVENOUS | Status: DC

## 2011-03-08 NOTE — Procedures (Signed)
Victoria Williams, KOTZ                ACCOUNT NO.:  [id number]   MEDICAL RECORD NO.:  [id number]          PATIENT TYPE:  OUT   LOCATION:  RAD                           FACILITY:  [facility]   PHYSICIAN:  Gerrit Friends. Dietrich Pates, MD, FACCDATE OF BIRTH:  Dec 18, 1965   DATE OF PROCEDURE:  DATE OF DISCHARGE:  01/29/2008                                ECHOCARDIOGRAM   REFERRING PHYSICIAN:  Patrica Duel, MD   CLINICAL DATA:  A 45 year old woman with chest pain.   1. Treadmill exercise performed to a workload of 10.1 METS with a      heart rate of 176, 99% of age-predicted maximum.  Exercise      discontinued due to dyspnea; no chest pain reported.  2. Blood pressure increased from a resting value of 125/75 to 170/70      during exercise, a normal response.  3. Resting EKG:  Normal sinus rhythm; prominent, but nondiagnostic      inferior Q waves - possible prior inferior myocardial infarction;      slightly delayed R wave progression; nonspecific T-wave      abnormality.  4. Stress EKG:  No significant change.  5. Resting echocardiogram:  Normal left atrial size; normal right      ventricular size and function; normal mitral valve; normal left      ventricular size and function; no LVH.  6. Stress echocardiogram:  The 4-chamber view was technically      inadequate; other images suggested increased contractility in all      myocardial segments.   IMPRESSION:  Negative stress echocardiogram revealing adequate exercise  tolerance, no symptoms to suggest myocardial ischemia, no exercise-  induced EKG abnormalities, and no echocardiographic evidence for  ischemia or infarction.  Other findings are as noted.      Gerrit Friends. Dietrich Pates, MD, [facility]  Electronically Signed     RMR/MEDQ  D:  01/30/2008  T:  01/31/2008  Job:  [id number]   cc:   Patrica Duel, M.D.  Fax: [phone]

## 2011-03-08 NOTE — Group Therapy Note (Signed)
Victoria Williams, Victoria Williams                ACCOUNT NO.:  [id number]   MEDICAL RECORD NO.:  [id number]          PATIENT TYPE:  INP   LOCATION:  A216                          FACILITY:  [facility]   PHYSICIAN:  Gardiner Barefoot, MD    DATE OF BIRTH:  1966-06-24   DATE OF PROCEDURE:  DATE OF DISCHARGE:                                 PROGRESS NOTE   SUBJECTIVE:  Patient has not had any further episodes of chest pain or  pressure.   SUBJECTIVE:  VITAL SIGNS:  Temperature is 98, pulse 96, respirations 18,  blood pressure 117/74 and O2 sats 96% on room air.  GENERAL:  The patient is awake, alert and oriented x3, appears in no  acute distress.  CARDIOVASCULAR:  Regular rate and rhythm.  No murmurs, rubs or gallops.  LUNGS:  Clear to auscultation bilaterally.  ABDOMEN:  Soft, nontender, nondistended, positive bowel sounds.  No  hepatosplenomegaly.  EXTREMITIES:  No cyanosis, clubbing or edema.   LABORATORY DATA:  CK-MB 0.6 and troponin I 0.02,  B met notable for a  potassium 3.3 and CBC with no significant abnormalities.   ASSESSMENT/PLAN:  1. Chest pain.  The patient will be seen in consultation by cardiology      and appreciate their input.  The patient is n.p.o. for anticipation      of a stress test. However, if none is done we will advance her      diet.  2. Abnormal chest x-ray.  The patient again has an abnormality in the      right base of her chest x-ray of unclear etiology.  She will need      follow-up of that as she is asymptomatic this time.      Gardiner Barefoot, MD  Electronically Signed     RWC/MEDQ  D:  01/22/2008  T:  01/22/2008  Job:  [id number]

## 2011-03-08 NOTE — H&P (Signed)
[place], [place]                ACCOUNT NO.:  [id number]   MEDICAL RECORD NO.:  [id number]          PATIENT TYPE:  INP   LOCATION:  A216                          FACILITY:  [facility]   PHYSICIAN:  Gardiner Barefoot, MD    DATE OF BIRTH:  1965/12/03   DATE OF ADMISSION:  01/21/2008  DATE OF DISCHARGE:  LH                              HISTORY & PHYSICAL   PRIMARY CARE PHYSICIAN:  Patrica Duel, M.D.   CHIEF COMPLAINTS:  Chest pain.   HISTORY OF PRESENT ILLNESS:  This is a 45 year old female with a history  of hypertension, not on therapy, who presents here with chest pain and  pressure that started at 2:00 p.m. the day of admission.  The patient  went to her primary care physician and then was sent to the emergency  room where her pain was relieved with sublingual nitroglycerin x3.  The  patient also reports she had a similar episode about 2 weeks ago that  resolved on its own and was of much shorter duration.  The patient does  report some shortness of breath associated with it and some nausea but  denies any diaphoresis or leg swelling.  The patient denies any cough,  fever or any other associated symptoms.   PAST MEDICAL HISTORY:  1. Hypertension.  2. Recurrent urinary infections with urinary retention.  3. Status post bladder tacking.  4. History of back surgery.  5. History of C-section.  6. History of hysterectomy.  7. History of knee surgery.  8. History of tonsillectomy.  9. History of tubal ligation   MEDICATIONS:  The patient does not take any medications.  However, she  has been prescribed hydrochlorothiazide, Mirapex and Valtrex.   ALLERGIES:  Include SULFA, KEFLEX, BENADRYL, PENICILLIN.   SOCIAL HISTORY:  Denies any alcohol, tobacco or drugs.   FAMILY HISTORY:  MI in her father at 32 and died of an MI in his 31s.   REVIEW OF SYSTEMS:  Negative except as per the History of Present  Illness.   PHYSICAL EXAMINATION:  VITAL SIGNS:  Temperature is 97.7, pulse 83,  respirations 19, blood pressure 127/70, and O2 saturation 98%.  GENERAL:  The patient is awake, alert and oriented x3 and appears in no  acute distress.  CARDIOVASCULAR:  Regular rate and rhythm with no murmurs, rubs or  gallops.  LUNGS:  Clear to auscultation bilaterally, no crackles appreciated.  ABDOMEN:  Soft, nontender, nondistended.  Positive bowel sounds.  No  hepatosplenomegaly.  EXTREMITIES:  No cyanosis, clubbing, edema.   LABORATORY:  EKG with normal sinus rhythm per report.   BNP is <30. D-dimer 0.22. Sodium 133, potassium 3.5, chloride 100,  bicarb 25, glucose 107, BUN 15, creatinine 0.84. WBC 11.4, hemoglobin  13.8, and platelets are 389.  CK-MB is less than 1, and troponin is less  than 0.05.   Chest x-ray shows airspace disease over the right hemidiaphragm which  could represent infection, atypical edema, or aspiration.   ASSESSMENT/PLAN:  1. Chest pain.  Will rule out for myocardial infarction and have      cardiology see  in the morning for question of a stress test.  Will      continue with sublingual nitroglycerin and morphine as needed for      pain. Will start the patient on aspirin and hold blood pressure      medicines at this time in anticipation of a stress test and will be      n.p.o. after midnight.  2. Abnormal chest x-ray.  Unclear of the process; however, this may      represent a viral infection, although the patient denies any      symptoms of cough or anything.  It could be the atypical edema or      aspiration.  We will need followup x-ray and potentially a CT for      further evaluation at some point in the near future.  Will defer at      this time and follow the patient closely. If she develops any      fever, coughing or other respiratory symptoms, will start her on      therapy for pneumonia.      Gardiner Barefoot, MD  Electronically Signed     RWC/MEDQ  D:  01/21/2008  T:  01/21/2008  Job:  [id number]   cc:   Patrica Duel, M.D.  Fax:  [phone]

## 2011-03-08 NOTE — Discharge Summary (Signed)
[place], KLAR                ACCOUNT NO.:  [id number]   MEDICAL RECORD NO.:  [id number]          PATIENT TYPE:  INP   LOCATION:  A216                          FACILITY:  [facility]   PHYSICIAN:  Osvaldo Shipper, MD     DATE OF BIRTH:  April 15, 1966   DATE OF ADMISSION:  01/21/2008  DATE OF DISCHARGE:  03/31/2009LH                               DISCHARGE SUMMARY   PRIMARY MEDICAL DOCTOR:  Patrica Duel, M.D.   DISCHARGE DIAGNOSES:  1. Chest pain.  Ruled out for acute coronary syndrome, requiring      outpatient stress test.  2. Abnormal chest x-ray requiring followup.  3. History of hypertension.   Please review H&P dictated by Dr. Luciana Axe for details regarding the  patient's presenting illness.   BRIEF HOSPITAL COURSE:  This is a 45 year old Caucasian female who has a  history of hypertension who presented complaining of chest pain and went  to her PMD's office and then to the emergency room, and the pain was  apparently relieved with nitroglycerin.  The patient had early  repolarization changes in her EKG in lead I and aVL.  The patient was  subsequently admitted to the hospital for further evaluation.  The  patient has ruled out for acute coronary syndrome.  She was a little bit  hypokalemic this morning, and that was replaced as well.  She was seen  by Dr. Dietrich Pates, who has recommended outpatient stress test,  which will  be arranged this coming Thursday.  The patient says that she still has  nonspecific chest pain but not as bad as it was.  Dr. Dietrich Pates feels  that this could have been GI related so he recommends PPI, which we will  be happy to prescribe.   For her abnormal chest x-ray, I will write her a prescription for a PA  and lateral chest film in 4 weeks, and ,if the abnormality still  persists, she may need to have a CAT scan.   DISCHARGE MEDICATIONS:  Prilosec 20 mg daily for 4 weeks.  Valtrex  orally, hydrochlorothiazide orally, Mirapex orally; doses are unknown.   FOLLOWUP:  Followup stress test on this coming Thursday, with Dr.  Dietrich Pates in 2 weeks, with PMD in 3 to 4 weeks, and chest x-ray in 4  weeks.   DIET:  Heart-healthy diet.   PHYSICAL ACTIVITY:  No restrictions.   The patient told that if her symptoms get worse, if she notices new  symptoms, shortness of breath, she needs to seek attention immediately.   Total discharge time:      Osvaldo Shipper, MD  Electronically Signed     GK/MEDQ  D:  01/22/2008  T:  01/22/2008  Job:  [id number]   cc:   Patrica Duel, M.D.  Fax: [phone]   Gerrit Friends. Dietrich Pates, MD, [facility]  [address]  [place], [place] [address]

## 2011-03-08 NOTE — Consult Note (Signed)
[place], CHAMPAGNE                ACCOUNT NO.:  [id number]   MEDICAL RECORD NO.:  [id number]          PATIENT TYPE:  INP   LOCATION:  A216                          FACILITY:  [facility]   PHYSICIAN:  Victoria Friends. Dietrich Pates, MD, FACCDATE OF BIRTH:  09-May-1966   DATE OF CONSULTATION:  01/22/2008  DATE OF DISCHARGE:                                 CONSULTATION   REASON FOR CONSULTATION:  Chest pain.   HISTORY OF PRESENT ILLNESS:  [place] is a 45 year old female  patient with no previously known history of coronary disease, but with a  history of hypertension that is currently untreated with medication who  presented to her primary care physician's office yesterday with  complaints of chest pain.  She has had chest heaviness off and on the  last two weeks.  It usually comes on at rest.  It lasts for about an  hour typically.  She does note shortness of breath associated with it as  well as some mild nausea.  She denies any history of diaphoresis,  syncope or near syncope associated with it.  Yesterday the pain occurred  around 1:00 p.m.  This time that was constant and lasted longer than an  hour.  She described as an 8/10 on a 1/10 scale.  She eventually went to  the primary care physician's office where she was given nitroglycerin.  This seemed to help her symptoms and she was referred to the emergency  room.  She received three more nitroglycerin in the ER and this resolved  her chest pain.  She currently complains of some soreness in her chest.  She says her neck feels heavy.  Of note, she did have some palpitations  with her chest discomfort.   PAST MEDICAL HISTORY:  1. Hypertension.      a.     The patient took herself off medication some time ago.  2. Questionable history of hyperlipidemia.  3. History of renal insufficiency.      a.     She is followed by Dr. Eliott [place] in [place].      b.     She has chronic peripheral edema from this and is on HCTZ       daily to control her  edema.      c.     She notes a history of microscopic hematuria.      d.     She also notes a history of pyelonephritis occurring before       her renal issues started.  4. She has a history of urinary incontinence.  5. Nephrolithiasis.  6. Status post total abdominal hysterectomy.  7. Status post C-section.  8. Status post bilateral tubal ligation.  9. Status post C-spine surgery.  10.Status post right knee arthroscopy.  11.Status post tonsillectomy.  12.Restless leg syndrome.   MEDICATIONS PRIOR TO ADMISSION:  HCTZ 12.5 mg daily.   ALLERGIES:  BENADRYL, PENICILLIN, KEFLEX, SULFA AND SEPTRA.   SOCIAL HISTORY:  The patient lives in [place] with her husband.  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  She has  three children.  She  denies tobacco, alcohol abuse or drug abuse.   FAMILY HISTORY:  Significant for coronary artery disease.  Her father  died at age 76 of a myocardial infarction.  He did have a myocardial  infarction first at age 9.  He had bypass surgery.  He also had a  history of abdominal aortic aneurysm.  Her mother is still alive with  leukemia at age 69.  Her brother has a history of throat cancer and does  have an irregular heartbeat.   REVIEW OF SYSTEMS:  Please see HPI.  She denies any fevers, chills, sore  throat, rash, orthopnea, PND, cough, wheezing, dysuria, hematuria.  She  has had some recent constipation as well as diarrhea as well as bright  blood per rectum and dark stools.  She has seen gastroenterology in  [place].  She is to have a colonoscopy in late April 2009.  She  denies dysphagia or odynophagia.  She denies skin changes.  Rest of the  review of systems are negative.   PHYSICAL EXAMINATION:  GENERAL APPEARANCE:  She is a well-nourished,  well-developed female.  VITAL SIGNS:  Blood pressure 106/52, pulse 73, respirations 20,  temperature 97.4, oxygen saturation 97% on room air, weight 104.6 kg.  HEENT:  Normal.  NECK:  Without  JVD.  LYMPH:  Without lymphadenopathy.  ENDOCRINE:  Without thyromegaly.  CARDIAC:  Normal S1, S2.  Regular rate and rhythm without murmur.  LUNGS:  Clear to auscultation bilaterally.  ABDOMEN:  Soft, nontender with normoactive bowel sounds.  No  organomegaly.  EXTREMITIES:  Without clubbing, cyanosis or edema.  MUSCULOSKELETAL:  Without joint deformity.  NEUROLOGIC:  She is alert and oriented x3.  Cranial nerves II-XII are  grossly intact.  VASCULAR:  No carotid artery bruits noted bilaterally.  Dorsalis pedis  and posterior tibialis pulses are 2+ bilaterally.   LABORATORY DATA:  Chest x-ray reveals airspace disease over the right  hemidiaphragm - question infection verses atypical edema versus  aspiration.  Follow-up films recommended.  EKG unavailable for review at  this time.  Laboratories:  White count 9900, hemoglobin 13.2, hematocrit  38.1, platelet count 343,000.  Sodium 135, potassium 3.3, BUN 13,  creatinine 0.74, glucose 115.  D-dimer 0.22.  BNP less than 30.  Point  of care markers negative x1.  CK #1 55, MB 0.6, troponin-I 0.02.  CK #2  60, CK-MB 0.6, troponin-I 0.02.   IMPRESSION:  1. Chest pain.  2. History of hypertension  3. History of renal insufficiency.      a.     Followed by nephrology in [place].  4. Recent history of bright red blood per rectum.      a.     Colonoscopy pending late April 2009.  5. Family history of coronary artery disease.  6. Hypokalemia.  7. Abnormal chest x-ray.   PLAN:  The patient presents with atypical chest pain symptoms.  Her  symptoms were relieved by nitroglycerin.  She has ruled out for  myocardial infarction thus far by enzymes.  Her EKG was apparently  normal per records in the ER.  We are trying to obtain that EKG  presently.  We will also try to obtain the EKG from [place]  office as well as a follow-up EKG today.  The patient's symptoms may be  explained by a gastrointestinal etiology.  We will add empiric  proton  pump inhibitor therapy.  It may be possible that she could proceed with  an outpatient stress echocardiogram.  We will await further review of  her EKGs first and further recommendations will follow.  Follow-up of  her abnormal chest x-ray will be per her primary service.  Her potassium  has been repleted by her primary service this morning.   Thank you very much for the consultation.  We will be glad to follow the  patient throughout the remainder of this admission.      Tereso Newcomer, PA-C      Victoria Friends. Dietrich Pates, MD, [facility]  Electronically Signed    SW/MEDQ  D:  01/22/2008  T:  01/22/2008  Job:  [id number]   cc:   Gardiner Barefoot, MD   Patrica Duel, M.D.  Fax: [phone]

## 2011-03-08 NOTE — Letter (Signed)
February 12, 2008    Patrica Duel, M.D.  [address]  [place], [place] [address]   RE:  [place], [place]  MRN:  [id number]  /  DOB:  [birth year]   Dear Loraine Leriche:   Ms. Rieger returns to the office after recent hospital admission for  chest discomfort.  Myocardial infarction was ruled out.  She had some  prominent Q waves on her EKG, but these did not apparently reflect  myocardial scarring, as left ventricular systolic function was normal on  echocardiography.  She returned as an outpatient for a stress  echocardiogram which was negative.  She did experience some recurrent  chest discomfort the day of her stress test, after exercise, and 2 days  thereafter.  Since then, she has felt fine.  She describes this as chest  heaviness without dyspnea, diaphoresis, nor nausea that passes  spontaneously.  She denies any anxiety or depression.  She has had no  history of GI disease but is scheduled for a colonoscopy within the next  few weeks for hematochezia.  She has joined [employer] and is  trying to lose weight.  Of note, there was a potassium level of 3.5 on  admission to hospital.  She also had an abnormal chest x-ray with  atelectasis or infiltrate at the right base, and a repeat study is  suggested.  She has taken diuretics in the past but has not required  them recently.   PHYSICAL EXAMINATION:  GENERAL:  Pleasant overweight woman in no acute  distress.  VITAL SIGNS:  The weight is 231 with a BMI of 40.  Blood pressure  105/80, heart rate 70 and regular, respirations 14 and unlabored.  NECK:  No jugular venous distention.  LUNGS:  Clear.  CARDIAC:  Normal first and second heart sounds.  ABDOMEN:  Soft and nontender.  EXTREMITIES:  No edema.   IMPRESSION:  Ms. Somera is doing generally well.  We discussed the  importance of continued efforts to lose weight.  I doubt that she has  any active cardiovascular issues.  She will inform her  gastroenterologist of her  admission for chest discomfort.  I suggested  antacid should symptoms recur.  We will obtain a followup chemistry  profile and chest x-ray.  If results are good, please let me know at any  time that I can assist in the care of this nice woman.    Sincerely,      Gerrit Friends. Dietrich Pates, MD, [facility]  Electronically Signed    RMR/MedQ  DD: 02/12/2008  DT: 02/12/2008  Job #: [id number]

## 2011-03-09 ENCOUNTER — Telehealth: Payer: Self-pay

## 2011-03-09 ENCOUNTER — Other Ambulatory Visit: Payer: Self-pay | Admitting: Gastroenterology

## 2011-03-09 NOTE — Telephone Encounter (Signed)

## 2011-03-09 NOTE — Telephone Encounter (Signed)
Pt scheduled for HIDA scan at [facility] 03/31/11 arrival time 7:45am, scan appt for 8am. Pt to be NPO after midnight. Appt scheduled with Dr. Arlyce Dice for 04/18/11@10 :15am. Pt aware of appt dates and times.

## 2011-03-11 NOTE — Op Note (Signed)
[facility]  Patient:    [place], [place] Visit Number: [id number] MRN: [id number]          Service Type: DSU Location: [id number] Attending Physician:  Frederich Balding Dictated by:   Juluis Mire, M.D. Admit Date:  03/07/2002                             Operative Report  PREOPERATIVE DIAGNOSIS:       Abnormal uterine bleeding.  POSTOPERATIVE DIAGNOSES:      1. Abnormal uterine bleeding.                               2. Pelvic adhesions.  OPERATIVE PROCEDURES:         1. Laparoscopy.                               2. Lysis of adhesions.                               3. Subsequent laparoscopically assisted vaginal                                  hysterectomy.  SURGEON:                      Juluis Mire, M.D.  ASSISTANT:                    Marcelle Overlie, M.D.  ANESTHESIA:                   General endotracheal.  ESTIMATED BLOOD LOSS:         300-400 cc.  PACKS AND DRAINS:             None.  INTRAOPERATIVE BLOOD REPLACED:               None.  COMPLICATIONS:                None.  INDICATIONS:                  Noted in the history and physical.  DESCRIPTION OF PROCEDURE:     The patient was taken to the OR and placed in the supine position.  After a satisfactory level of general endotracheal anesthesia was obtained, the patient was placed in the dorsal supine position using the Allen stirrups.  The abdomen, perineum and vagina were prepped out with Betadine.  The bladder was emptied with in-and-out catheterization.  A Hulka tenaculum was put in place and secured.  The patient was then draped out as a sterile field.  A subumbilical incision was made with a knife.  A Veress needle was introduced to the abdominal cavity.  The abdomen was insufflated with approximately 4 L of carbon dioxide.  The operating laparoscope was introduced.  There was no evidence of injury to adjacent organs.  A 5 mm trocar was put in place in the  suprapubic area under direct visualization. The uterus was of normal size and shape.  She had a previous bilateral tubal ligation noted.  The little bands were still noted on the tubes.  The ovaries were  otherwise unremarkable.  She had marked adhesions to the anterior part of the uterus involving the bladder flap area, probably from the prior cesarean section.  The appendix was noted to be normal.  The upper abdomen, including the liver and tip of the gallbladder, were completely normal.  At this point in time, we used the Plasmakinetic tripolar.  We first went to the right side of the uterus.  The right round ligament was cauterized and incised.  The right tube and mesosalpinx were cauterized and incised.  The right utero-ovarian pedicle was cauterized and incised.  We could easily follow the course of the ureter.  This was well down below our area of surgery.  We then went to the left adnexa.  Similarly, the left round ligament was cauterized and incised.  The left tube and mesosalpinx were cauterized and incised.  The left utero-ovarian pedicle was cauterized and incised.  we then went to the bladder flap area.  She had a dense band of tissue connecting to the anterior uterus.  We started to the right.  We could see the reflection of the peritoneum.  This was not near the bladder.  We again used the tripolar and took this down using cautery and incision.  Eventually, the dense band o the very front of the uterus was taken down.  With this, we had fairly good mobilization of the uterus.  The decision was to go vaginally.  At this point in time, the abdomen was desufflated of its carbon dioxide.  The laparoscope was removed.  The patients legs were repositioned.  A weighted speculum was placed in the vaginal vault.  The cervix was grasped with a Christella Hartigan tenaculum.  The cul-de-sac was entered sharply.  Both uterosacral ligaments were clamped, cut and suture ligated with 0 [place].  The  reflection of the vaginal mucosa anteriorly was incised using the Bovie.  The paracervical tissue was clamped, cut and suture ligated with 0 Vicryl.  We continued the sharp dissection of the bladder.  The parametrial tissue was clamped, cut and suture ligated with 0 Vicryl.  We were unable to really identify the vesicouterine space.  We continued sharp dissection.  Actually, we probably went a little deep into the uterus.  Eventually, we continued separating the parametrium from the sides of the uterus using the clamp, cut and tie technique with suture ligatures of 0 Vicryl.  We then flipped the uterus and were able to break through the vesicouterine reflection of the peritoneum.  The remaining pedicles were clamped and cut and the uterus was passed off the operative field.  The held pedicles were secured with a free tie of 0 Vicryl.  At this point in time, the vaginal mucosa was reapproximated in the midline with interrupted figure-of-eight of 0 Vicryl.  The uterosacral ligament were brought together similarly and ligated together.  At this point in time, the vaginal mucosa was approximated.  We had good hemostasis.  A Foley was placed to straight drain with retrieval of an adequate amount of clear urine.  A sponge on a sponge stick was placed in the vaginal vault.  The weighted speculum was then removed.  The patients legs were repositioned. The abdomen was reinsufflated with carbon dioxide.  The laparoscope was reintroduced.  Visualization revealed some oozing from the left uterosacral ligament.  This was brought under control with the tripolar.  The utero-ovarian pedicles were stable.  No active bleeding was noted at this point in time.  We thoroughly irrigated  the pelvis and irrigation was removed. The abdomen was desufflated of its carbon dioxide.  All trocars were removed. The subumbilical fascia was closed with interrupted subcuticular of 4-0 Vicryl.  The suprapubic incision was  closed with interrupted sutures of 3-0 Vicryl.  The sponge on the sponge stick was removed from the vaginal vault.  The legs were taken down.  The patient, once extubated, was transferred to the recovery room in good condition.  Sponge, needle and instrument counts were reported as correct by the circulating nurse x2.  Urine output remained clear and adequate. Dictated by:   Juluis Mire, M.D. Attending Physician:  Frederich Balding DD:  03/07/02 TD:  03/07/02 Job: [id number]

## 2011-03-11 NOTE — Discharge Summary (Signed)
[facility]  Patient:    Victoria Williams, Victoria Williams Visit Number: [id number] MRN: [id number]          Service Type: DSU Location: 9300 [id number] Attending Physician:  Frederich Balding Dictated by:   Juluis Mire, M.D. Admit Date:  03/07/2002 Discharge Date: 03/08/2002                             Discharge Summary  ADMISSION DIAGNOSIS:          Abnormal uterine bleeding, possible adenomyosis.  DISCHARGE DIAGNOSIS:          Abnormal uterine bleeding, possible adenomyosis. Plus pelvic adhesions, pathology pending.  PROCEDURE:                    Diagnostic laparoscopy with lysis of adhesions and laparoscopically-assisted vaginal hysterectomy.  HISTORY OF PRESENT ILLNESS:   For complete history and physical, please see dictated note.  HOSPITAL COURSE:              The patient underwent above noted surgery.  She did have extensive pelvic adhesions from the bladder to the anterior abdominal wall.  These were taken down through the laparoscope.  She had a successful laparoscopically-assisted vaginal hysterectomy.  Ovaries were unremarkable and left in place.  No other pelvic pathology was noted.  Postoperatively, she did well.  On the first postoperative day hemoglobin was 10.3.  She was tolerating her diet, ambulating without difficulty, and actually was passing flatus and having normal bladder function.  She had no active vaginal bleeding.  All incisions were intact.  She was discharged home at her request that day.  COMPLICATIONS:                None were encountered during her stay in the hospital.  The patient was discharged home in stable condition.  DISPOSITION:                  The patient is discharged home on Tylox as needed for pain, iron sulfate supplementation.  She is to avoid vaginal entrance, heavy lifting, or driving a car.  She is to watch for signs of infection, nausea and vomiting, increasing abdominal pain, or active  vaginal bleeding.  She will follow up in the office in one week. Dictated by:   Juluis Mire, M.D. Attending Physician:  Frederich Balding DD:  03/08/02 TD:  03/11/02 Job: [id number]

## 2011-03-11 NOTE — H&P (Signed)
[facility]  Patient:    Victoria Williams, Victoria Williams Visit Number: [id number] MRN: [id number]          Service Type: DSU Location: 9300 [id number] Attending Physician:  Frederich Balding Dictated by:   Juluis Mire, M.D. Admit Date:  03/07/2002                           History and Physical  REASON FOR ADMISSION:         The patient is a 45 year old gravida 3 para 3 married white female who presents for laparoscopically-assisted vaginal hysterectomy.  HISTORY OF PRESENT ILLNESS:   The patient has had problems with increasing menstrual flow.  She had anywhere from two to seven days of flow.  She describes most days of being heavy with clots and increasing pain.  She is using over-the-counter management for the discomfort without response. Subsequent ultrasound evaluation was performed in our office with a saline infusion ultrasound.  Sonohysterogram was completely unremarkable.  There was no evidence of polyps or fibroids.  In view of the patients continued heavy flow and discomfort associated with her cycles, various options have been discussed.  She was placed on birth control pills without any significant response.  Due to continued bleeding pattern the patient now presents for laparoscopically-assisted vaginal hysterectomy.  Alternatives such as other hormonal management or endometrial ablation have been discussed.  It is of note the patient has had a prior bilateral tubal ligation.  ALLERGIES:                    PENICILLIN, SULFA, and BENADRYL.  MEDICATIONS:                  Over-the-counter nonsteroidal anti-inflammatories.  PAST MEDICAL HISTORY:         Usual childhood diseases without significant sequelae.  She does have a history of pyelonephritis as well as a history of kidney stones.  History of cervical dysplasia treated with laser conization in 1989.  Follow-up Pap smears have been normal.  PAST SURGICAL HISTORY:        She had a previous  cesarean section and bilateral tubal ligation in 1996, a previous tonsillectomy.  She has also had previous knee surgery and back surgery.  OBSTETRICAL HISTORY:          She has had one vaginal delivery in 1987, subsequent cesarean section for twins in 1996.  FAMILY HISTORY:               Mother has a history of a prior hysterectomy done for cervical dysplasia.  Father with history of skin cancer.  Her father did have a history of myocardial infarction.  Mother also with a history of kidney stones.  SOCIAL HISTORY:               No tobacco or alcohol use.  REVIEW OF SYSTEMS:            Noncontributory.  PHYSICAL EXAMINATION:  VITAL SIGNS:                  The patient is afebrile with stable vital signs.  HEENT:                        The patient normocephalic.  Pupils equal, round, and reactive to light and accommodation.  Extraocular movements were intact. Sclerae and conjunctivae were clear.  Oropharynx clear.  NECK:                         Without thyromegaly.  BREASTS:                      No discrete masses.  LUNGS:                        Clear.  CARDIOVASCULAR:               Regular rhythm and rate without murmurs or gallops.  ABDOMEN:                      Benign.  No masses, organomegaly, or tenderness.  PELVIC:                       Normal external genitalia, vaginal mucosa is clear.  Cervix unremarkable.  Uterus normal size, shape, and contour.  Adnexa free of masses or tenderness.  EXTREMITIES:                  Trace edema.  NEUROLOGIC:                   Grossly within normal limits.  IMPRESSION:                   Abnormal uterine bleeding uncontrolled with conservative management, possible uterine adenomyosis.  PLAN:                         The patient to undergo laparoscopically-assisted vaginal hysterectomy.  The risks of surgery have been discussed including the risk of anesthesia; the risk of infection; the risk of hemorrhage that could require a  transfusion with the risk of AIDS or hepatitis; the risk of injury to adjacent organs including bladder, bowel, ureters that could require further exploratory surgery; the risk of deep vein thrombosis and pulmonary embolus.  The patient professed and understanding of the indications and risks. Dictated by:   Juluis Mire, M.D. Attending Physician:  Frederich Balding DD:  03/07/02 TD:  03/07/02 Job: [id number]

## 2011-03-14 ENCOUNTER — Encounter: Payer: Self-pay | Admitting: Gastroenterology

## 2011-03-31 ENCOUNTER — Encounter (HOSPITAL_COMMUNITY): Payer: Self-pay

## 2011-03-31 ENCOUNTER — Encounter (HOSPITAL_COMMUNITY)
Admission: RE | Admit: 2011-03-31 | Discharge: 2011-03-31 | Disposition: A | Discharge status: Home / Self Care | Payer: Medicare HMO | Source: Ambulatory Visit | Attending: Gastroenterology | Admitting: Gastroenterology

## 2011-03-31 DIAGNOSIS — R109 Unspecified abdominal pain: Secondary | ICD-10-CM | POA: Insufficient documentation

## 2011-03-31 MED ORDER — TECHNETIUM TC 99M MEBROFENIN IV KIT
5.5000 | PACK | Freq: Once | INTRAVENOUS | Status: AC | PRN
  Administered 2011-03-31: 6 via INTRAVENOUS

## 2011-03-31 MED ORDER — SINCALIDE 5 MCG IJ SOLR: 0.0200 ug/kg | Freq: Once | INTRAMUSCULAR | Status: DC

## 2011-04-01 ENCOUNTER — Telehealth: Payer: Self-pay

## 2011-04-01 NOTE — Telephone Encounter (Signed)
Message copied by Michele Mcalpine on Fri Apr 01, 2011 11:44 AM ------      Message from: Melvia Heaps MD D      Created: Thu Mar 31, 2011 10:41 AM       Needs f/u OV next few weeks

## 2011-04-01 NOTE — Telephone Encounter (Signed)
Pt scheduled to see Dr. Arlyce Dice 04/18/11@10 :15am. Letter sent to pt. With appt date and time.

## 2011-04-18 ENCOUNTER — Ambulatory Visit (INDEPENDENT_AMBULATORY_CARE_PROVIDER_SITE_OTHER): Payer: Medicare HMO | Admitting: Gastroenterology

## 2011-04-18 ENCOUNTER — Encounter: Payer: Self-pay | Admitting: Gastroenterology

## 2011-04-18 VITALS — BP 124/62 | HR 92

## 2011-04-18 DIAGNOSIS — R1013 Epigastric pain: Secondary | ICD-10-CM

## 2011-04-18 NOTE — Assessment & Plan Note (Signed)
Pain most likely related to GERD and erosive esophagitis-resolved  Recommendations #1 complete eight-week course of dexilant and try to wean medicine

## 2011-04-18 NOTE — Patient Instructions (Signed)
Call back as needed 

## 2011-04-18 NOTE — Progress Notes (Signed)
History of Present Illness:  Victoria Williams has returned following upper endoscopy for evaluation of upper abdominal pain. Erosive esophagitis was seen. On a regimen of dexilant symptoms have entirely resolved. She currently has no GI complaints. HIDA scan demonstrated a decrease in gallbladder ejection fraction but CCK injection did not induce abdominal pain.   Review of Systems: Pertinent positive and negative review of systems were noted in the above HPI section. All other review of systems were otherwise negative.    Current Medications, Allergies, Past Medical History, Past Surgical History, Family History and Social History were reviewed in [employer] electronic medical record  Vital signs were reviewed in today's medical record. Physical Exam: General: Well developed , well nourished, no acute distress

## 2011-06-30 ENCOUNTER — Ambulatory Visit (HOSPITAL_COMMUNITY)
Admission: RE | Admit: 2011-06-30 | Discharge: 2011-06-30 | Disposition: A | Discharge status: Home / Self Care | Payer: Managed Care, Other (non HMO) | Source: Ambulatory Visit | Attending: Neurosurgery | Admitting: Neurosurgery

## 2011-06-30 DIAGNOSIS — M549 Dorsalgia, unspecified: Secondary | ICD-10-CM | POA: Insufficient documentation

## 2011-06-30 DIAGNOSIS — M79609 Pain in unspecified limb: Secondary | ICD-10-CM

## 2011-07-18 LAB — DIFFERENTIAL
Basophils Absolute: 0.1
Basophils Absolute: 0.1
Basophils Relative: 1
Basophils Relative: 1
Eosinophils Absolute: 0.2
Eosinophils Absolute: 0.2
Eosinophils Relative: 2
Eosinophils Relative: 2
Lymphocytes Relative: 29
Lymphocytes Relative: 34
Lymphs Abs: 3.3
Lymphs Abs: 3.3
Monocytes Absolute: 0.9
Monocytes Absolute: 1
Monocytes Relative: 10
Monocytes Relative: 8
Neutro Abs: 5.3
Neutro Abs: 6.9
Neutrophils Relative %: 54
Neutrophils Relative %: 61

## 2011-07-18 LAB — BASIC METABOLIC PANEL
BUN: 13
BUN: 15
CO2: 25
CO2: 28
Calcium: 9
Calcium: 9
Chloride: 100
Chloride: 101
Creatinine, Ser: 0.74
Creatinine, Ser: 0.84
GFR calc Af Amer: >60
GFR calc Af Amer: >60
GFR calc non Af Amer: >60
GFR calc non Af Amer: >60
Glucose, Bld: 107 — ABNORMAL HIGH
Glucose, Bld: 115 — ABNORMAL HIGH
Potassium: 3.3 — ABNORMAL LOW
Potassium: 3.5
Sodium: 133 — ABNORMAL LOW
Sodium: 135

## 2011-07-18 LAB — CBC
HCT: 38.1
HCT: 39.5
Hemoglobin: 13.2
Hemoglobin: 13.8
MCHC: 34.6
MCHC: 34.8
MCV: 85.4
MCV: 86
Platelets: 343
Platelets: 389
RBC: 4.43
RBC: 4.62
RDW: 13.4
RDW: 13.9
WBC: 11.4 — ABNORMAL HIGH
WBC: 9.9

## 2011-07-18 LAB — POCT CARDIAC MARKERS
CKMB, poc: <1 — ABNORMAL LOW
Myoglobin, poc: 36.5
Operator id: 217071
Troponin i, poc: <0.05

## 2011-07-18 LAB — LIPID PANEL
Cholesterol: 178
HDL: 25 — ABNORMAL LOW
LDL Cholesterol: 119 — ABNORMAL HIGH
Total CHOL/HDL Ratio: 7.1
Triglycerides: 171 — ABNORMAL HIGH
VLDL: 34

## 2011-07-18 LAB — CARDIAC PANEL(CRET KIN+CKTOT+MB+TROPI)
CK, MB: 0.6
CK, MB: 0.6
Relative Index: INVALID
Relative Index: INVALID
Total CK: 55
Total CK: 60
Troponin I: 0.02
Troponin I: 0.02

## 2011-07-18 LAB — D-DIMER, QUANTITATIVE: D-Dimer, Quant: 0.22

## 2011-07-18 LAB — B-NATRIURETIC PEPTIDE (CONVERTED LAB): Pro B Natriuretic peptide (BNP): <30

## 2011-07-22 LAB — WOUND CULTURE

## 2011-07-27 ENCOUNTER — Emergency Department (HOSPITAL_COMMUNITY)
Admission: EM | Admit: 2011-07-27 | Discharge: 2011-07-27 | Disposition: A | Discharge status: Home / Self Care | Payer: Managed Care, Other (non HMO) | Attending: Emergency Medicine | Admitting: Emergency Medicine

## 2011-07-27 DIAGNOSIS — R079 Chest pain, unspecified: Secondary | ICD-10-CM | POA: Insufficient documentation

## 2011-07-27 DIAGNOSIS — I10 Essential (primary) hypertension: Secondary | ICD-10-CM | POA: Insufficient documentation

## 2011-07-27 DIAGNOSIS — K209 Esophagitis, unspecified without bleeding: Secondary | ICD-10-CM | POA: Insufficient documentation

## 2011-07-27 DIAGNOSIS — Z79899 Other long term (current) drug therapy: Secondary | ICD-10-CM | POA: Insufficient documentation

## 2011-11-16 ENCOUNTER — Other Ambulatory Visit: Payer: Self-pay | Admitting: Obstetrics and Gynecology

## 2011-11-16 DIAGNOSIS — R928 Other abnormal and inconclusive findings on diagnostic imaging of breast: Secondary | ICD-10-CM

## 2011-11-23 ENCOUNTER — Ambulatory Visit
Admission: RE | Admit: 2011-11-23 | Discharge: 2011-11-23 | Disposition: A | Discharge status: Home / Self Care | Payer: Managed Care, Other (non HMO) | Source: Ambulatory Visit | Attending: Obstetrics and Gynecology | Admitting: Obstetrics and Gynecology

## 2011-11-23 DIAGNOSIS — R928 Other abnormal and inconclusive findings on diagnostic imaging of breast: Secondary | ICD-10-CM

## 2012-01-07 ENCOUNTER — Emergency Department (HOSPITAL_COMMUNITY)
Admission: EM | Admit: 2012-01-07 | Discharge: 2012-01-07 | Disposition: A | Discharge status: Home / Self Care | Payer: Managed Care, Other (non HMO) | Attending: Emergency Medicine | Admitting: Emergency Medicine

## 2012-01-07 ENCOUNTER — Emergency Department (HOSPITAL_COMMUNITY): Payer: Managed Care, Other (non HMO)

## 2012-01-07 ENCOUNTER — Encounter (HOSPITAL_COMMUNITY): Payer: Self-pay

## 2012-01-07 DIAGNOSIS — M546 Pain in thoracic spine: Secondary | ICD-10-CM | POA: Insufficient documentation

## 2012-01-07 DIAGNOSIS — S0990XA Unspecified injury of head, initial encounter: Secondary | ICD-10-CM | POA: Insufficient documentation

## 2012-01-07 DIAGNOSIS — E119 Type 2 diabetes mellitus without complications: Secondary | ICD-10-CM | POA: Insufficient documentation

## 2012-01-07 DIAGNOSIS — Z79899 Other long term (current) drug therapy: Secondary | ICD-10-CM | POA: Insufficient documentation

## 2012-01-07 DIAGNOSIS — M542 Cervicalgia: Secondary | ICD-10-CM | POA: Insufficient documentation

## 2012-01-07 DIAGNOSIS — N189 Chronic kidney disease, unspecified: Secondary | ICD-10-CM | POA: Insufficient documentation

## 2012-01-07 DIAGNOSIS — I129 Hypertensive chronic kidney disease with stage 1 through stage 4 chronic kidney disease, or unspecified chronic kidney disease: Secondary | ICD-10-CM | POA: Insufficient documentation

## 2012-01-07 DIAGNOSIS — K589 Irritable bowel syndrome without diarrhea: Secondary | ICD-10-CM | POA: Insufficient documentation

## 2012-01-07 DIAGNOSIS — R51 Headache: Secondary | ICD-10-CM | POA: Insufficient documentation

## 2012-01-07 DIAGNOSIS — M549 Dorsalgia, unspecified: Secondary | ICD-10-CM | POA: Insufficient documentation

## 2012-01-07 DIAGNOSIS — T148XXA Other injury of unspecified body region, initial encounter: Secondary | ICD-10-CM

## 2012-01-07 DIAGNOSIS — W19XXXA Unspecified fall, initial encounter: Secondary | ICD-10-CM

## 2012-01-07 DIAGNOSIS — W108XXA Fall (on) (from) other stairs and steps, initial encounter: Secondary | ICD-10-CM | POA: Insufficient documentation

## 2012-01-07 DIAGNOSIS — M129 Arthropathy, unspecified: Secondary | ICD-10-CM | POA: Insufficient documentation

## 2012-01-07 MED ORDER — METHOCARBAMOL 500 MG PO TABS: 1000.0000 mg | ORAL_TABLET | Freq: Four times a day (QID) | ORAL | Status: AC | PRN

## 2012-01-07 MED ORDER — HYDROCODONE-ACETAMINOPHEN 5-325 MG PO TABS: ORAL_TABLET | ORAL | Status: AC

## 2012-01-07 NOTE — ED Notes (Signed)
Pt was going up steps and fell backward striking her head and back. No loc

## 2012-01-07 NOTE — ED Notes (Signed)
Pt a/ox4. Resp even and unlabored. NAD at this time. D/C instructions reviewed with pt. Pt verbalized understanding. Pt to lobby via w/c with husband to transport home.

## 2012-01-07 NOTE — ED Provider Notes (Signed)
History     CSN: [id number]  Arrival date & time 01/07/12  1455   First MD Initiated Contact with Patient 01/07/12 1518      Chief Complaint  Patient presents with  . Fall    HPI Pt was seen at 1525.  Per pt, c/o sudden onset and resolution of one episode of slip and fall backwards down several steps PTA.  Pt states she hit the back of her head, neck, and back on the ground.  Denies prodromal symptoms before fall, no LOC, no AMS, no focal motor weakness, no tingling/numbness in extremities, no abd pain, no CP/SOB.    Past Medical History  Diagnosis Date  . Henoch-Schonlein purpura   . Hypertension   . IBS (irritable bowel syndrome)   . History of kidney stones   . Obesity   . Arthritis   . Diabetes mellitus   . History of UTI   . Chronic kidney disease   . Esophagitis 2012    EGD    Past Surgical History  Procedure Date  . Abdominal hysterectomy   . Tubal ligation   . Cesarean section   . Knee surgery   . Tonsilectomy, adenoidectomy, bilateral myringotomy and tubes   . Neck surgery     Family History  Problem Relation Age of Onset  . Colon cancer Neg Hx   . Leukemia Mother   . Diabetes Mother   . Esophageal cancer Brother   . Diabetes Maternal Aunt   . Diabetes Maternal Uncle   . Prostate cancer Maternal Grandmother   . Diabetes Maternal Grandfather     History  Substance Use Topics  . Smoking status: Never Smoker   . Smokeless tobacco: Never Used  . Alcohol Use: 0.6 oz/week    1 Glasses of wine per week     2 a month     Review of Systems ROS: Statement: All systems negative except as marked or noted in the HPI; Constitutional: Negative for fever and chills. ; ; Eyes: Negative for eye pain, redness and discharge. ; ; ENMT: Negative for ear pain, hoarseness, nasal congestion, sinus pressure and sore throat. ; ; Cardiovascular: Negative for chest pain, palpitations, diaphoresis, dyspnea and peripheral edema. ; ; Respiratory: Negative for cough, wheezing  and stridor. ; ; Gastrointestinal: Negative for nausea, vomiting, diarrhea, abdominal pain, blood in stool, hematemesis, jaundice and rectal bleeding. . ; ; Genitourinary: Negative for dysuria, flank pain and hematuria. ; ; Musculoskeletal: +back pain and neck pain. Negative for swelling and trauma.; ; Skin: Negative for pruritus, rash, abrasions, blisters, bruising and skin lesion.; ; Neuro: Negative for headache, lightheadedness and neck stiffness. Negative for weakness, altered level of consciousness , altered mental status, extremity weakness, paresthesias, involuntary movement, seizure and syncope.     Allergies  Septra; Sulfa antibiotics; Ciprocin-fluocin-procin; Keflex; Levaquin; and Penicillins  Home Medications   Current Outpatient Rx  Name Route Sig Dispense Refill  . ACETAMINOPHEN 325 MG PO TABS Oral Take 325 mg by mouth. As needed     . LAXATIVE PO Oral Take by mouth. As needed     . CYCLOBENZAPRINE HCL 10 MG PO TABS Oral Take 10 mg by mouth 2 (two) times daily as needed.      [name] DEXILANT 60 MG PO CPDR  Two times a day before biggest meals     . HYDROCHLOROTHIAZIDE 25 MG PO TABS Oral Take 25 mg by mouth daily.      [name] HYDROCODONE-ACETAMINOPHEN 10-325 MG PO TABS      .  HYOSCYAMINE SULFATE 0.125 MG SL SUBL  As needed     . LINAGLIPTIN 5 MG PO TABS Oral Take by mouth 1 dose over 24 hours.      [name] LISINOPRIL 5 MG PO TABS Oral Take 5 mg by mouth daily.     [name] PRAMIPEXOLE DIHYDROCHLORIDE 0.25 MG PO TABS Oral Take 0.25 mg by mouth. Three at bedtime    . SOLIFENACIN SUCCINATE 10 MG PO TABS Oral Take 5 mg by mouth daily.      . TRIAMTERENE-HCTZ 75-50 MG PO TABS        BP 124/69  Pulse 73  Temp(Src) 98.4 F (36.9 C) (Oral)  Resp 20  Ht 5\' 3"  (1.6 m)  Wt 236 lb (107.049 kg)  BMI 41.81 kg/m2  SpO2 96%  Physical Exam 1530: Physical examination: Vital signs and O2 SAT: Reviewed; Constitutional: Well developed, Well nourished, Well hydrated, In no acute distress; Head and Face:  Normocephalic, Atraumatic; Eyes: EOMI, PERRL, No scleral icterus; ENMT: Mouth and pharynx normal, Mucous membranes moist; Neck: Immobilized in C-collar, Trachea midline; Spine: Immobilized on spineboard, No midline CS, TS, LS tenderness, +TTP bilat cervical, thoracic, lumbar paraspinal muscles..; Cardiovascular: Regular rate and rhythm, No murmur, rub, or gallop; Respiratory: Breath sounds clear & equal bilaterally, No rales, rhonchi, wheezes, or rub, Normal respiratory effort/excursion; Chest: Nontender, No deformity, Movement normal, No crepitus, No abrasions or ecchymosis.; Abdomen: Soft, Nontender, Nondistended, Normal bowel sounds, No abrasions or ecchymosis.; Genitourinary: No CVA tenderness; Extremities: No deformity, Full range of motion with exception of cast on left lower leg, Neurovascularly intact, Pulses normal, No tenderness, No edema, Pelvis stable; Neuro: AA&Ox3, Normal speech, GCS 15.  Major CN grossly intact.  No gross focal motor or sensory deficits in extremities.; Skin: Color normal, Warm, Dry   ED Course  Procedures    MDM  MDM Reviewed: nursing note and vitals Interpretation: x-ray and CT scan    Dg Chest 2 View 01/07/2012  *RADIOLOGY REPORT*  Clinical Data: Fall, back pain  CHEST - 2 VIEW  Comparison: Chest radiograph 02/27/2008  Findings: Stable enlarged heart silhouette.  No effusion, infiltrate, pneumothorax. Degenerative osteophytosis of the thoracic spine.  Anterior cervical fusion.  IMPRESSION: No acute cardiopulmonary process.  Original Report Authenticated By: Genevive Bi, M.D.   Dg Lumbar Spine Complete 01/07/2012  *RADIOLOGY REPORT*  Clinical Data: Back pain, fall  LUMBAR SPINE - COMPLETE 4+ VIEW  Comparison: Lumbar spine 07/04/2011  Findings: There is normal alignment of the lumbar vertebral bodies. There is mild endplate spurring of the vertebral bodies.  No subluxation.  No loss of to body height or disc height.  No pars fracture.  IMPRESSION: No traumatic  injury to the lumbar spine.  Mild disc osteophytic disease.  Original Report Authenticated By: Genevive Bi, M.D.   Ct Head Wo Contrast 01/07/2012  *RADIOLOGY REPORT*  Clinical Data:  Posterior neck and head pain after falling down stairs.  CT HEAD WITHOUT CONTRAST CT CERVICAL SPINE WITHOUT CONTRAST  Technique:  Multidetector CT imaging of the head and cervical spine was performed following the standard protocol without intravenous contrast.  Multiplanar CT image reconstructions of the cervical spine were also generated.  Comparison:  Cervical spine radiographs 02/19/2010.  CT HEAD  Findings: No acute cortical infarct, hemorrhage, mass lesion is present.  The ventricles are normal size.  No significant extra- axial fluid collection is present.  The paranasal sinuses and mastoid air cells are clear.  IMPRESSION: Negative CT of the head.  CT CERVICAL SPINE  Findings: The cervical spine is imaged from the skull base through the midbody of T2.  The vertebral body heights and alignment are maintained.  The patient is status post anterior fusion at C6-7. The fusion is mature.  Hardware is intact.  No acute fracture or traumatic subluxation is present.  IMPRESSION:  1.  No acute fracture or traumatic subluxation. 2.  Stable postsurgical changes at the C6-7.  Original Report Authenticated By: Jamesetta Orleans. MATTERN, M.D.   Ct Cervical Spine Wo Contrast 01/07/2012  *RADIOLOGY REPORT*  Clinical Data:  Posterior neck and head pain after falling down stairs.  CT HEAD WITHOUT CONTRAST CT CERVICAL SPINE WITHOUT CONTRAST  Technique:  Multidetector CT imaging of the head and cervical spine was performed following the standard protocol without intravenous contrast.  Multiplanar CT image reconstructions of the cervical spine were also generated.  Comparison:  Cervical spine radiographs 02/19/2010.  CT HEAD  Findings: No acute cortical infarct, hemorrhage, mass lesion is present.  The ventricles are normal size.  No significant  extra- axial fluid collection is present.  The paranasal sinuses and mastoid air cells are clear.  IMPRESSION: Negative CT of the head.  CT CERVICAL SPINE  Findings: The cervical spine is imaged from the skull base through the midbody of T2.  The vertebral body heights and alignment are maintained.  The patient is status post anterior fusion at C6-7. The fusion is mature.  Hardware is intact.  No acute fracture or traumatic subluxation is present.  IMPRESSION:  1.  No acute fracture or traumatic subluxation. 2.  Stable postsurgical changes at the C6-7.  Original Report Authenticated By: Jamesetta Orleans. MATTERN, M.D.    4:53 PM:  Denies pain in LLE, cast appears intact.  Wants to go home now.  Dx testing d/w pt.  Questions answered.  Verb understanding, agreeable to d/c home with outpt f/u.           Laray Anger, DO 01/08/12 1540

## 2012-01-07 NOTE — Discharge Instructions (Signed)
 RESOURCE GUIDE  Dental Problems  Patients with Medicaid: [facility] [phone] [address]                                           [phone] [address] Phone:  [phone]                                                  Phone:  [phone]  If unable to pay or uninsured, contact:  [facility] or [facility] to become qualified for the adult dental clinic.  Chronic Pain Problems Contact [facility]  [phone] Patients need to be referred by their primary care doctor.  Insufficient Money for Medicine Contact [employer]:  call "211" or [facility] [phone])313-0281.  No Primary Care Doctor Call [facility]  5066492073 Other agencies that provide inexpensive medical care    [facility]  [phone]    [facility]  4342330186    [facility]  ([phone]    [facility]  [phone]    [facility]  [phone]    [facility]  [phone]  Psychological Services [facility]  [phone] [facility]  [phone] [facility]   [phone] (emergency services [phone])  Substance Abuse Resources [facility]  ([phone] 652-0174 [facility] [phone])556-1351 [facility] 479-197-7422 Floydene Flock [phone] Residential & Outpatient Substance Abuse Program  (226)640-4466  Abuse/Neglect [facility] Child Abuse Hotline (551)624-1088 [facility] Child Abuse Hotline 509-687-6730 (After Hours)  Emergency Shelter [facility] (631) 846-9983  Maternity Homes [facility] [place] [facility] (416) 037-3553 [facility] 209-183-4990  MRSA Hotline #:   613-360-0516    [facility] Resources  [facility]     [employer]                          [facility] Dept. [address] [place]                       [address]      [address]  Blondell Reveal Phone:  [phone]                                   Phone:  [phone]                 Phone:  [phone]  [facility] Phone:  ([phone]  [facility] Child Abuse Hotline 619-028-8005 430-708-9425 (After Hours)   Take the prescriptions as directed.  Apply moist heat or ice to the area(s) of discomfort, for 15 minutes at a time, several times per day for the next few days.  Do not fall asleep on a heating or ice pack.  Call your regular medical doctor on Monday to schedule a follow up appointment this week.  Return to the Emergency Department immediately if worsening.

## 2012-04-03 ENCOUNTER — Encounter (HOSPITAL_COMMUNITY): Payer: Self-pay | Admitting: *Deleted

## 2012-04-03 DIAGNOSIS — N201 Calculus of ureter: Secondary | ICD-10-CM | POA: Insufficient documentation

## 2012-04-03 DIAGNOSIS — E119 Type 2 diabetes mellitus without complications: Secondary | ICD-10-CM | POA: Insufficient documentation

## 2012-04-03 DIAGNOSIS — I10 Essential (primary) hypertension: Secondary | ICD-10-CM | POA: Insufficient documentation

## 2012-04-03 DIAGNOSIS — R109 Unspecified abdominal pain: Secondary | ICD-10-CM | POA: Insufficient documentation

## 2012-04-03 NOTE — ED Notes (Signed)
Pain rt flank diarrhea, n/v  Hx of kidney stones.

## 2012-04-04 ENCOUNTER — Emergency Department (HOSPITAL_COMMUNITY)
Admission: EM | Admit: 2012-04-04 | Discharge: 2012-04-04 | Disposition: A | Discharge status: Home / Self Care | Payer: Managed Care, Other (non HMO) | Attending: Emergency Medicine | Admitting: Emergency Medicine

## 2012-04-04 ENCOUNTER — Emergency Department (HOSPITAL_COMMUNITY): Payer: Managed Care, Other (non HMO)

## 2012-04-04 DIAGNOSIS — N200 Calculus of kidney: Secondary | ICD-10-CM

## 2012-04-04 LAB — URINALYSIS, ROUTINE W REFLEX MICROSCOPIC
Glucose, UA: 500 mg/dL — AB
Ketones, ur: NEGATIVE mg/dL
Leukocytes, UA: NEGATIVE
pH: 6 (ref 5.0–8.0)

## 2012-04-04 LAB — URINE MICROSCOPIC-ADD ON

## 2012-04-04 LAB — BASIC METABOLIC PANEL
CO2: 22 mEq/L (ref 19–32)
Chloride: 104 mEq/L (ref 96–112)
Glucose, Bld: 168 mg/dL — ABNORMAL HIGH (ref 70–99)
Sodium: 138 mEq/L (ref 135–145)

## 2012-04-04 MED ORDER — KETOROLAC TROMETHAMINE 30 MG/ML IJ SOLN
30.0000 mg | Freq: Once | INTRAMUSCULAR | Status: AC
  Administered 2012-04-04: 30 mg via INTRAVENOUS
  Filled 2012-04-04: qty 1

## 2012-04-04 MED ORDER — PROMETHAZINE HCL 25 MG PO TABS: 12.5000 mg | ORAL_TABLET | Freq: Four times a day (QID) | ORAL | Status: DC | PRN

## 2012-04-04 MED ORDER — HYDROMORPHONE HCL PF 1 MG/ML IJ SOLN
0.5000 mg | Freq: Once | INTRAMUSCULAR | Status: AC
  Administered 2012-04-04: 0.5 mg via INTRAVENOUS
  Filled 2012-04-04: qty 1

## 2012-04-04 MED ORDER — SODIUM CHLORIDE 0.9 % IV SOLN: Freq: Once | INTRAVENOUS | Status: DC

## 2012-04-04 MED ORDER — SODIUM CHLORIDE 0.9 % IV SOLN
Freq: Once | INTRAVENOUS | Status: AC
  Administered 2012-04-04: 03:00:00 via INTRAVENOUS

## 2012-04-04 MED ORDER — HYDROMORPHONE HCL PF 1 MG/ML IJ SOLN
1.0000 mg | Freq: Once | INTRAMUSCULAR | Status: AC
  Administered 2012-04-04: 1 mg via INTRAVENOUS
  Filled 2012-04-04: qty 1

## 2012-04-04 MED ORDER — SODIUM CHLORIDE 0.9 % IV BOLUS (SEPSIS)
1000.0000 mL | Freq: Once | INTRAVENOUS | Status: AC
  Administered 2012-04-04: 1000 mL via INTRAVENOUS

## 2012-04-04 MED ORDER — HYDROCODONE-ACETAMINOPHEN 5-325 MG PO TABS: 1.0000 | ORAL_TABLET | ORAL | Status: AC | PRN

## 2012-04-04 MED ORDER — ONDANSETRON HCL 4 MG/2ML IJ SOLN
4.0000 mg | Freq: Once | INTRAMUSCULAR | Status: AC
  Administered 2012-04-04: 4 mg via INTRAVENOUS
  Filled 2012-04-04: qty 2

## 2012-04-04 NOTE — Discharge Instructions (Signed)
You have a 3 mm stone ready to go into the bladder. You will pass this on your own. Drink lots of fluids. Use the pain and nausea medicine as needed. Follow up with your doctor.   Kidney Stones Kidney stones (ureteral lithiasis) are solid masses that form inside your kidneys. The intense pain is caused by the stone moving through the kidney, ureter, bladder, and urethra (urinary tract). When the stone moves, the ureter starts to spasm around the stone. The stone is usually passed in the urine.  HOME CARE  Drink enough fluids to keep your pee (urine) clear or pale yellow. This helps to get the stone out.   Strain all pee through the provided strainer. Do not pee without peeing through the strainer, not even once. If you pee the stone out, catch it. The stone may be as small as a grain of salt. Take this to your doctor.   Only take medicine as told by your doctor.   Follow up with your doctor as told.   Get follow-up X-rays as told by your doctor.  GET HELP RIGHT AWAY IF:   Your pain does not get better with medicine.   You have a fever.   Your pain increases and gets worse over 18 hours.   You have new belly (abdominal) pain.   You feel faint or pass out.  MAKE SURE YOU:   Understand these instructions.   Will watch your condition.   Will get help right away if you are not doing well or get worse.  Document Released: 03/28/2008 Document Revised: 09/29/2011 Document Reviewed: 08/07/2009 [facility] Patient Information 2012 [place], [place].

## 2012-04-04 NOTE — ED Notes (Signed)
Patient brought back to room 10 at 0045 during downtime.

## 2012-04-05 NOTE — ED Provider Notes (Signed)
History     CSN: [id number]  Arrival date & time 04/03/12  2317   First MD Initiated Contact with Patient 04/04/12 0149      Chief Complaint  Patient presents with  . Flank Pain    (Consider location/radiation/quality/duration/timing/severity/associated sxs/prior treatment) HPI Victoria Williams is a 46 y.o. female  With a h/o kidney stones who presents to the Emergency Department complaining of right flank pain with radiation to the right lower abdomen that began yesterday and became much worse tonight. Pain is sharp and similar to previous kidney stones. It has been associated with nausea and vomiting. She denies fever, chills, chest pain.  PCP Dr. Phillips Odor Past Medical History  Diagnosis Date  . Henoch-Schonlein purpura   . Hypertension   . IBS (irritable bowel syndrome)   . History of kidney stones   . Obesity   . Arthritis   . Diabetes mellitus   . History of UTI   . Chronic kidney disease   . Esophagitis 2012    EGD    Past Surgical History  Procedure Date  . Abdominal hysterectomy   . Tubal ligation   . Cesarean section   . Knee surgery   . Tonsilectomy, adenoidectomy, bilateral myringotomy and tubes   . Neck surgery   . Tonsillectomy   . Achilles tendon surgery     Family History  Problem Relation Age of Onset  . Colon cancer Neg Hx   . Leukemia Mother   . Diabetes Mother   . Esophageal cancer Brother   . Diabetes Maternal Aunt   . Diabetes Maternal Uncle   . Prostate cancer Maternal Grandmother   . Diabetes Maternal Grandfather     History  Substance Use Topics  . Smoking status: Never Smoker   . Smokeless tobacco: Never Used  . Alcohol Use: 0.6 oz/week    1 Glasses of wine per week     2 a month     OB History    Grav Para Term Preterm Abortions TAB SAB Ect Mult Living                  Review of Systems  Constitutional: Negative for fever.       10 Systems reviewed and are negative for acute change except as noted in the HPI.  HENT:  Negative for congestion.   Eyes: Negative for discharge and redness.  Respiratory: Negative for cough and shortness of breath.   Cardiovascular: Negative for chest pain.  Gastrointestinal: Positive for nausea, vomiting and abdominal pain. Negative for diarrhea.  Genitourinary: Positive for flank pain.  Musculoskeletal: Negative for back pain.  Skin: Negative for rash.  Neurological: Negative for syncope, numbness and headaches.  Psychiatric/Behavioral:       No behavior change.    Allergies  Septra; Sulfa antibiotics; Cephalexin; Ciprocin-fluocin-procin; Levofloxacin; and Penicillins  Home Medications   Current Outpatient Rx  Name Route Sig Dispense Refill  . HYDROCHLOROTHIAZIDE 25 MG PO TABS Oral Take 25 mg by mouth daily.      [name] PRAMIPEXOLE DIHYDROCHLORIDE 0.25 MG PO TABS Oral Take 0.25 mg by mouth. Three at bedtime    . SOLIFENACIN SUCCINATE 10 MG PO TABS Oral Take 5 mg by mouth daily.      . ACETAMINOPHEN 325 MG PO TABS Oral Take 325 mg by mouth. As needed     . LAXATIVE PO Oral Take by mouth. As needed     . DEXILANT 60 MG PO CPDR  Two times a  day before biggest meals    . DEXLANSOPRAZOLE 60 MG PO CPDR Oral Take 1 capsule (60 mg total) by mouth 2 (two) times daily before a meal. 60 capsule 2  . HYDROCODONE-ACETAMINOPHEN 5-325 MG PO TABS Oral Take 1 tablet by mouth every 4 (four) hours as needed for pain. 15 tablet 0  . PROMETHAZINE HCL 25 MG PO TABS Oral Take 0.5 tablets (12.5 mg total) by mouth every 6 (six) hours as needed for nausea. 10 tablet 0    BP 126/70  Pulse 78  Temp 97.5 F (36.4 C) (Oral)  Resp 18  Ht 5' 3.5" (1.613 m)  Wt 240 lb (108.863 kg)  BMI 41.85 kg/m2  SpO2 98%  Physical Exam  Nursing note and vitals reviewed. Constitutional: She is oriented to person, place, and time. She appears well-developed and well-nourished.       Awake, alert, nontoxic appearance.  HENT:  Head: Normocephalic and atraumatic.  Eyes: Pupils are equal, round, and reactive to  light. Right eye exhibits no discharge. Left eye exhibits no discharge.  Neck: Neck supple.  Cardiovascular: Normal rate and regular rhythm.   Pulmonary/Chest: Effort normal and breath sounds normal. She exhibits no tenderness.  Abdominal: Soft. Bowel sounds are normal. There is no tenderness. There is no rebound.  Genitourinary:       Right cva tenderness to percussion  Musculoskeletal: She exhibits no tenderness.       Baseline ROM, no obvious new focal weakness.  Neurological: She is alert and oriented to person, place, and time.       Mental status and motor strength appears baseline for patient and situation.  Skin: Skin is warm and dry. No rash noted.  Psychiatric: She has a normal mood and affect.    ED Course  Procedures (including critical care time)  Results for orders placed during the hospital encounter of 04/04/12  URINALYSIS, ROUTINE W REFLEX MICROSCOPIC      Component Value Range   Color, Urine YELLOW  YELLOW   APPearance CLEAR  CLEAR   Specific Gravity, Urine 1.025  1.005 - 1.030   pH 6.0  5.0 - 8.0   Glucose, UA 500 (*) NEGATIVE mg/dL   Hgb urine dipstick LARGE (*) NEGATIVE   Bilirubin Urine NEGATIVE  NEGATIVE   Ketones, ur NEGATIVE  NEGATIVE mg/dL   Protein, ur TRACE (*) NEGATIVE mg/dL   Urobilinogen, UA 0.2  0.0 - 1.0 mg/dL   Nitrite NEGATIVE  NEGATIVE   Leukocytes, UA NEGATIVE  NEGATIVE  BASIC METABOLIC PANEL      Component Value Range   Sodium 138  135 - 145 mEq/L   Potassium 3.6  3.5 - 5.1 mEq/L   Chloride 104  96 - 112 mEq/L   CO2 22  19 - 32 mEq/L   Glucose, Bld 168 (*) 70 - 99 mg/dL   BUN 15  6 - 23 mg/dL   Creatinine, Ser 1.61  0.50 - 1.10 mg/dL   Calcium 9.5  8.4 - 09.6 mg/dL   GFR calc non Af Amer >90  >90 mL/min   GFR calc Af Amer >90  >90 mL/min  URINE MICROSCOPIC-ADD ON      Component Value Range   Squamous Epithelial / LPF RARE  RARE   WBC, UA 0-2  <3 WBC/hpf   RBC / HPF TOO NUMEROUS TO COUNT  <3 RBC/hpf   Bacteria, UA RARE  RARE    Urine-Other MUCOUS PRESENT     Ct Abdomen Pelvis Wo Contrast  04/04/2012  *RADIOLOGY REPORT*  Clinical Data: Right flank pain  CT ABDOMEN AND PELVIS WITHOUT CONTRAST  Technique:  Multidetector CT imaging of the abdomen and pelvis was performed following the standard protocol without intravenous contrast.  Comparison: 02/04/2011  Findings: Heart size upper normal to mildly enlarged.  Mild linear opacity at the bases may reflect scarring or atelectasis.  Organ abnormality/lesion detection is limited in the absence of intravenous contrast. Within this limitation, unremarkable liver, biliary system, spleen, pancreas, adrenal glands, left kidney.  Duplicated collecting system on the right. Ureters fuse proximal to the level of a 3 mm right UVJ stone. There is proximal mild hydroureteronephrosis.  No bowel obstruction.  No CT evidence for colitis.  Appendix not identified.  No right lower quadrant inflammation.  Tiny periumbilical fat containing hernia.  Decompressed bladder.  Absent uterus.  No adnexal mass.  There is scattered atherosclerotic calcification of the aorta and its branches. No aneurysmal dilatation.  Multilevel degenerative changes of the imaged spine. No acute or aggressive appearing osseous lesion.  IMPRESSION: Duplicated right collecting system with hydroureteronephrosis to the level of a 3 mm right UVJ stone.  Original Report Authenticated By: Waneta Martins, M.D.     1. Kidney stone       MDM  Patient with h/o kidney stones here with right flank radiating to right lower abdomen. Given IVF, antiinflammatory, analgesic and antiemetic with relief of the flank pain. CT abdomen and pelvis with a 3 mm right UVJ stone. Dx testing d/w pt.  Questions answered.  Verb understanding, agreeable to d/c home with outpt f/u. Pt feels improved after observation and/or treatment in ED.Pt stable in ED with no significant deterioration in condition.The patient appears reasonably screened and/or stabilized  for discharge and I doubt any other medical condition or other [facility] requiring further screening, evaluation, or treatment in the ED at this time prior to discharge.  MDM Reviewed: nursing note and vitals Interpretation: CT scan and labs            [facility]. Colon Branch, MD 04/05/12 1241

## 2012-04-26 ENCOUNTER — Encounter (HOSPITAL_COMMUNITY): Payer: Self-pay | Admitting: *Deleted

## 2012-04-26 ENCOUNTER — Emergency Department (HOSPITAL_COMMUNITY)
Admission: EM | Admit: 2012-04-26 | Discharge: 2012-04-26 | Disposition: A | Discharge status: Home / Self Care | Payer: Managed Care, Other (non HMO) | Attending: Emergency Medicine | Admitting: Emergency Medicine

## 2012-04-26 DIAGNOSIS — E119 Type 2 diabetes mellitus without complications: Secondary | ICD-10-CM | POA: Insufficient documentation

## 2012-04-26 DIAGNOSIS — R197 Diarrhea, unspecified: Secondary | ICD-10-CM | POA: Insufficient documentation

## 2012-04-26 DIAGNOSIS — M129 Arthropathy, unspecified: Secondary | ICD-10-CM | POA: Insufficient documentation

## 2012-04-26 DIAGNOSIS — N189 Chronic kidney disease, unspecified: Secondary | ICD-10-CM | POA: Insufficient documentation

## 2012-04-26 DIAGNOSIS — I129 Hypertensive chronic kidney disease with stage 1 through stage 4 chronic kidney disease, or unspecified chronic kidney disease: Secondary | ICD-10-CM | POA: Insufficient documentation

## 2012-04-26 DIAGNOSIS — R079 Chest pain, unspecified: Secondary | ICD-10-CM | POA: Insufficient documentation

## 2012-04-26 DIAGNOSIS — R51 Headache: Secondary | ICD-10-CM

## 2012-04-26 LAB — CBC WITH DIFFERENTIAL/PLATELET
Basophils Absolute: 0 10*3/uL (ref 0.0–0.1)
Basophils Relative: 0 % (ref 0–1)
Lymphocytes Relative: 26 % (ref 12–46)
MCHC: 33 g/dL (ref 30.0–36.0)
Monocytes Absolute: 0.8 10*3/uL (ref 0.1–1.0)
Neutro Abs: 7.7 10*3/uL (ref 1.7–7.7)
Neutrophils Relative %: 66 % (ref 43–77)
Platelets: 351 10*3/uL (ref 150–400)
RDW: 13.8 % (ref 11.5–15.5)
WBC: 11.7 10*3/uL — ABNORMAL HIGH (ref 4.0–10.5)

## 2012-04-26 LAB — URINALYSIS, ROUTINE W REFLEX MICROSCOPIC
Ketones, ur: NEGATIVE mg/dL
Leukocytes, UA: NEGATIVE
Nitrite: NEGATIVE
pH: 6 (ref 5.0–8.0)

## 2012-04-26 LAB — BASIC METABOLIC PANEL
Chloride: 103 mEq/L (ref 96–112)
Creatinine, Ser: 0.64 mg/dL (ref 0.50–1.10)
GFR calc Af Amer: >90 mL/min (ref >90)
Potassium: 3.5 mEq/L (ref 3.5–5.1)
Sodium: 138 mEq/L (ref 135–145)

## 2012-04-26 LAB — URINE MICROSCOPIC-ADD ON

## 2012-04-26 LAB — TROPONIN I: Troponin I: <0.3 ng/mL (ref <0.30)

## 2012-04-26 MED ORDER — KETOROLAC TROMETHAMINE 30 MG/ML IJ SOLN
30.0000 mg | Freq: Once | INTRAMUSCULAR | Status: AC
  Administered 2012-04-26: 30 mg via INTRAVENOUS
  Filled 2012-04-26: qty 1

## 2012-04-26 MED ORDER — SODIUM CHLORIDE 0.9 % IV BOLUS (SEPSIS): 1000.0000 mL | Freq: Once | INTRAVENOUS | Status: DC

## 2012-04-26 MED ORDER — DIPHENHYDRAMINE HCL 50 MG/ML IJ SOLN
25.0000 mg | Freq: Once | INTRAMUSCULAR | Status: AC
  Administered 2012-04-26: 25 mg via INTRAVENOUS
  Filled 2012-04-26: qty 1

## 2012-04-26 MED ORDER — METOCLOPRAMIDE HCL 10 MG PO TABS: 10.0000 mg | ORAL_TABLET | Freq: Four times a day (QID) | ORAL | Status: DC | PRN

## 2012-04-26 MED ORDER — LOPERAMIDE HCL 2 MG PO CAPS
4.0000 mg | ORAL_CAPSULE | Freq: Once | ORAL | Status: AC
  Administered 2012-04-26: 4 mg via ORAL
  Filled 2012-04-26: qty 2

## 2012-04-26 MED ORDER — METOCLOPRAMIDE HCL 5 MG/ML IJ SOLN
10.0000 mg | Freq: Once | INTRAMUSCULAR | Status: AC
  Administered 2012-04-26: 10 mg via INTRAVENOUS
  Filled 2012-04-26: qty 2

## 2012-04-26 NOTE — ED Notes (Signed)
Discharge instructions reviewed.

## 2012-04-26 NOTE — ED Provider Notes (Signed)
History     CSN: [id number]  Arrival date & time 04/26/12  1655   First MD Initiated Contact with Patient 04/26/12 1723      Chief Complaint  Patient presents with  . Headache  . Numbness    (Consider location/radiation/quality/duration/timing/severity/associated sxs/prior treatment) Patient is a 46 y.o. female presenting with headaches. The history is provided by the patient.  Headache   She started getting sick yesterday afternoon with headache, chest pain, nausea, diarrhea, numbness in her left arm, and aching in her left arm. Pain was severe and rated at 10/10, but has improved today and is only 6/10. His pain waxes and wanes. Her arm feels worse when she swings her arm but nothing seems to make it any better. She did take a dose of aspirin last night because she was worried she was having a heart attack. She denies dyspnea, fever, chills, sweats. She has not taken anything to help her diarrhea. She has had 4 watery bowel movements a day and still feels like she is going to have more diarrhea.  Past Medical History  Diagnosis Date  . Henoch-Schonlein purpura   . Hypertension   . IBS (irritable bowel syndrome)   . History of kidney stones   . Obesity   . Arthritis   . Diabetes mellitus   . History of UTI   . Chronic kidney disease   . Esophagitis 2012    EGD    Past Surgical History  Procedure Date  . Abdominal hysterectomy   . Tubal ligation   . Cesarean section   . Knee surgery   . Tonsilectomy, adenoidectomy, bilateral myringotomy and tubes   . Neck surgery   . Tonsillectomy   . Achilles tendon surgery     Family History  Problem Relation Age of Onset  . Colon cancer Neg Hx   . Leukemia Mother   . Diabetes Mother   . Esophageal cancer Brother   . Diabetes Maternal Aunt   . Diabetes Maternal Uncle   . Prostate cancer Maternal Grandmother   . Diabetes Maternal Grandfather     History  Substance Use Topics  . Smoking status: Never Smoker   . Smokeless  tobacco: Never Used  . Alcohol Use: 0.6 oz/week    1 Glasses of wine per week     2 a month     OB History    Grav Para Term Preterm Abortions TAB SAB Ect Mult Living                  Review of Systems  Neurological: Positive for headaches.  All other systems reviewed and are negative.    Allergies  Septra; Sulfa antibiotics; Cephalexin; Ciprocin-fluocin-procin; Levofloxacin; and Penicillins  Home Medications   Current Outpatient Rx  Name Route Sig Dispense Refill  . ACETAMINOPHEN 325 MG PO TABS Oral Take 325 mg by mouth. As needed     . LAXATIVE PO Oral Take by mouth. As needed     . DEXILANT 60 MG PO CPDR  Two times a day before biggest meals    . DEXLANSOPRAZOLE 60 MG PO CPDR Oral Take 1 capsule (60 mg total) by mouth 2 (two) times daily before a meal. 60 capsule 2  . HYDROCHLOROTHIAZIDE 25 MG PO TABS Oral Take 25 mg by mouth daily.      [name] PRAMIPEXOLE DIHYDROCHLORIDE 0.25 MG PO TABS Oral Take 0.25 mg by mouth. Three at bedtime    . PROMETHAZINE HCL 25 MG PO TABS  Oral Take 0.5 tablets (12.5 mg total) by mouth every 6 (six) hours as needed for nausea. 10 tablet 0  . SOLIFENACIN SUCCINATE 10 MG PO TABS Oral Take 5 mg by mouth daily.        BP 147/82  Pulse 88  Temp 98.3 F (36.8 C) (Oral)  Resp 20  Ht 5\' 4"  (1.626 m)  Wt 142 lb (64.411 kg)  BMI 24.37 kg/m2  SpO2 99%  Physical Exam  Nursing note and vitals reviewed.  46 year old female who is resting comfortably, in no acute distress. Vital signs are significant for mild hypertension with blood pressure 147/82. Oxygen saturation is 99% which is normal. Head is normocephalic and atraumatic. PERRLA, EOMI. Oropharynx is clear. Neck is nontender and supple. Neck has mild tenderness which he relates to having had previous neck surgery. Back is nontender. Lungs are clear without rales, wheezes, rhonchi. Heart has regular rate and rhythm without murmur. His no chest wall tenderness. Abdomen is soft, flat, nontender without  masses or hepatosplenomegaly. Trace edema, no cyanosis. Full range of motion is present. Pulses are strong and capillary refill is prompt. Skin is warm and dry without rash. Neurologic: Mental status is normal, cranial nerves are intact. Careful motor and sensory exam is completely normal. Deep tendon reflexes are symmetric.  ED Course  Procedures (including critical care time)  Results for orders placed during the hospital encounter of 04/26/12  CBC WITH DIFFERENTIAL      Component Value Range   WBC 11.7 (*) 4.0 - 10.5 K/uL   RBC 4.85  3.87 - 5.11 MIL/uL   Hemoglobin 13.8  12.0 - 15.0 g/dL   HCT 16.1  09.6 - 04.5 %   MCV 86.2  78.0 - 100.0 fL   MCH 28.5  26.0 - 34.0 pg   MCHC 33.0  30.0 - 36.0 g/dL   RDW 40.9  81.1 - 91.4 %   Platelets 351  150 - 400 K/uL   Neutrophils Relative 66  43 - 77 %   Neutro Abs 7.7  1.7 - 7.7 K/uL   Lymphocytes Relative 26  12 - 46 %   Lymphs Abs 3.1  0.7 - 4.0 K/uL   Monocytes Relative 6  3 - 12 %   Monocytes Absolute 0.8  0.1 - 1.0 K/uL   Eosinophils Relative 1  0 - 5 %   Eosinophils Absolute 0.2  0.0 - 0.7 K/uL   Basophils Relative 0  0 - 1 %   Basophils Absolute 0.0  0.0 - 0.1 K/uL  BASIC METABOLIC PANEL      Component Value Range   Sodium 138  135 - 145 mEq/L   Potassium 3.5  3.5 - 5.1 mEq/L   Chloride 103  96 - 112 mEq/L   CO2 24  19 - 32 mEq/L   Glucose, Bld 136 (*) 70 - 99 mg/dL   BUN 13  6 - 23 mg/dL   Creatinine, Ser 7.82  0.50 - 1.10 mg/dL   Calcium 9.9  8.4 - 95.6 mg/dL   GFR calc non Af Amer >90  >90 mL/min   GFR calc Af Amer >90  >90 mL/min  URINALYSIS, ROUTINE W REFLEX MICROSCOPIC      Component Value Range   Color, Urine YELLOW  YELLOW   APPearance CLEAR  CLEAR   Specific Gravity, Urine 1.025  1.005 - 1.030   pH 6.0  5.0 - 8.0   Glucose, UA 100 (*) NEGATIVE mg/dL   Hgb urine dipstick  MODERATE (*) NEGATIVE   Bilirubin Urine NEGATIVE  NEGATIVE   Ketones, ur NEGATIVE  NEGATIVE mg/dL   Protein, ur NEGATIVE  NEGATIVE mg/dL    Urobilinogen, UA 0.2  0.0 - 1.0 mg/dL   Nitrite NEGATIVE  NEGATIVE   Leukocytes, UA NEGATIVE  NEGATIVE  TROPONIN I      Component Value Range   Troponin I <0.30  <0.30 ng/mL  URINE MICROSCOPIC-ADD ON      Component Value Range   WBC, UA 3-6  <3 WBC/hpf   RBC / HPF 7-10  <3 RBC/hpf   Ct Abdomen Pelvis Wo Contrast  04/04/2012  *RADIOLOGY REPORT*  Clinical Data: Right flank pain  CT ABDOMEN AND PELVIS WITHOUT CONTRAST  Technique:  Multidetector CT imaging of the abdomen and pelvis was performed following the standard protocol without intravenous contrast.  Comparison: 02/04/2011  Findings: Heart size upper normal to mildly enlarged.  Mild linear opacity at the bases may reflect scarring or atelectasis.  Organ abnormality/lesion detection is limited in the absence of intravenous contrast. Within this limitation, unremarkable liver, biliary system, spleen, pancreas, adrenal glands, left kidney.  Duplicated collecting system on the right. Ureters fuse proximal to the level of a 3 mm right UVJ stone. There is proximal mild hydroureteronephrosis.  No bowel obstruction.  No CT evidence for colitis.  Appendix not identified.  No right lower quadrant inflammation.  Tiny periumbilical fat containing hernia.  Decompressed bladder.  Absent uterus.  No adnexal mass.  There is scattered atherosclerotic calcification of the aorta and its branches. No aneurysmal dilatation.  Multilevel degenerative changes of the imaged spine. No acute or aggressive appearing osseous lesion.  IMPRESSION: Duplicated right collecting system with hydroureteronephrosis to the level of a 3 mm right UVJ stone.  Original Report Authenticated By: Waneta Martins, M.D.     ECG shows normal sinus rhythm with a rate of 95, no ectopy. Normal axis. Normal P wave. Normal QRS. Normal intervals. Normal ST and T waves. Impression: normal ECG. When compared with ECG of 07/27/2011, no significant changes are seen.   1. Diarrhea   2. Headache     3. Chest pain       MDM  Multiple complaints which started yesterday and a pattern is most consistent with a viral syndrome. I see no evidence of cardiac pathology at this point but cardiac markers will be checked. She will be given IV hydration, oral loperamide to treat diarrhea, IV metoclopramide, Benadryl, ketorolac to treat headache and muscular symptoms and she will be reevaluated following this.  She feels much better after IV fluids, Benadryl, metoclopramide, and ketorolac. Symptoms seem most likely due to a viral syndrome and should be treated symptomatically. She's a vice use Imodium AD as needed for diarrhea, acetaminophen or ibuprofen as needed for pain or fever, and is given a prescription for metoclopramide.   Dione Booze, MD 04/26/12 2018

## 2012-04-26 NOTE — ED Notes (Signed)
Pt states that she started having a headache at 2pm yesterday, left arm started hurting between 5-6 pm yesterday, nausea and diarrhea started after that, then pt began to experience left arm pain and chest pain, pt states that she did take one aspirin 81mg  yesterday, today left arm still tingling, still continues to have diarrhea, denies any headache or chest pain today. Denies any problems with her legs,

## 2012-05-14 ENCOUNTER — Other Ambulatory Visit: Payer: Self-pay | Admitting: Obstetrics and Gynecology

## 2012-05-14 DIAGNOSIS — N63 Unspecified lump in unspecified breast: Secondary | ICD-10-CM

## 2012-05-17 ENCOUNTER — Ambulatory Visit
Admission: RE | Admit: 2012-05-17 | Discharge: 2012-05-17 | Disposition: A | Discharge status: Home / Self Care | Payer: Managed Care, Other (non HMO) | Source: Ambulatory Visit | Attending: Obstetrics and Gynecology | Admitting: Obstetrics and Gynecology

## 2012-05-17 DIAGNOSIS — N63 Unspecified lump in unspecified breast: Secondary | ICD-10-CM

## 2012-11-05 ENCOUNTER — Other Ambulatory Visit: Payer: Self-pay | Admitting: Obstetrics and Gynecology

## 2012-11-05 DIAGNOSIS — N63 Unspecified lump in unspecified breast: Secondary | ICD-10-CM

## 2012-11-09 IMAGING — CR DG ABDOMEN ACUTE W/ 1V CHEST
4 series · 4 of 4 positions shown · non-contrast
Comparison: CT abdomen and pelvis 05/25/2010.  Chest x-ray
02/27/2008.

CLINICAL DATA: Abdomen pain for 21 days with diarrhea.

ACUTE ABDOMEN SERIES (ABDOMEN 2 VIEW & CHEST 1 VIEW)

[w chest pa]
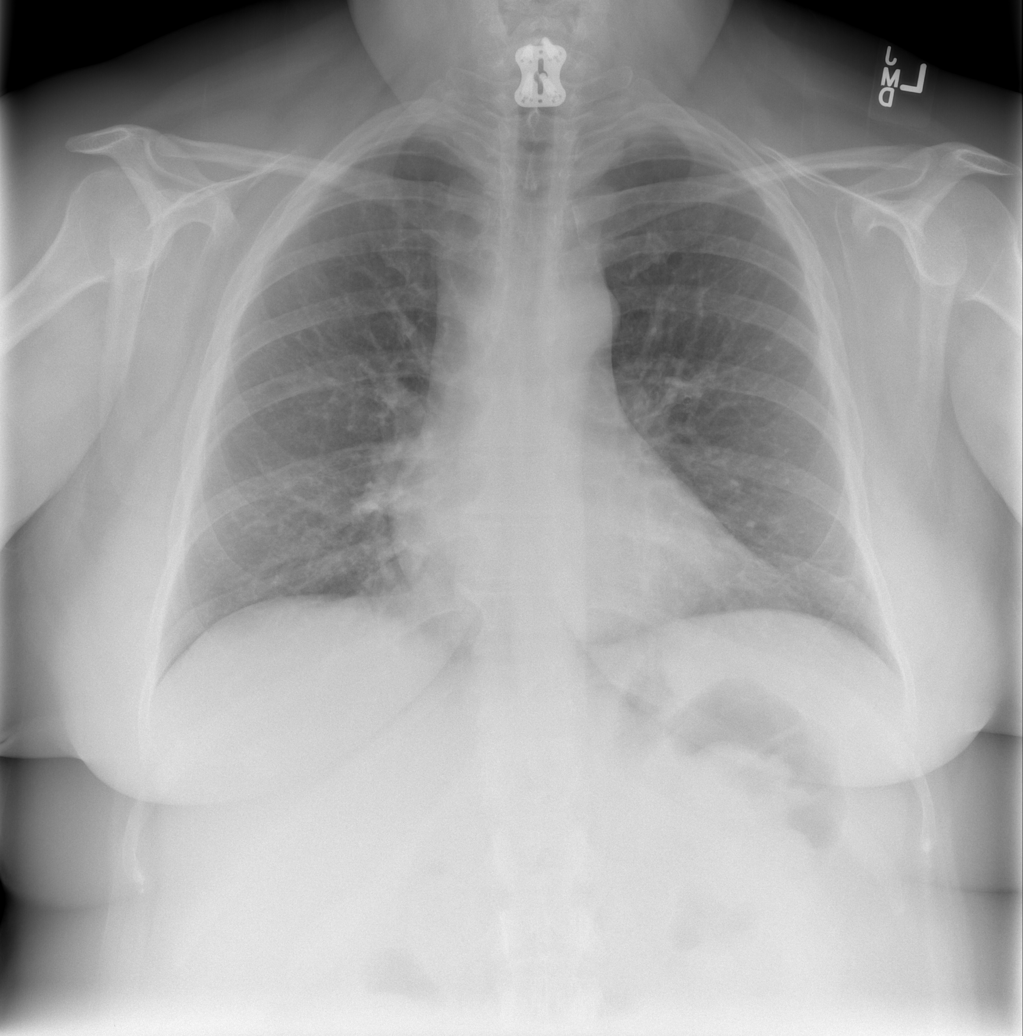

[w abdomen upright *]
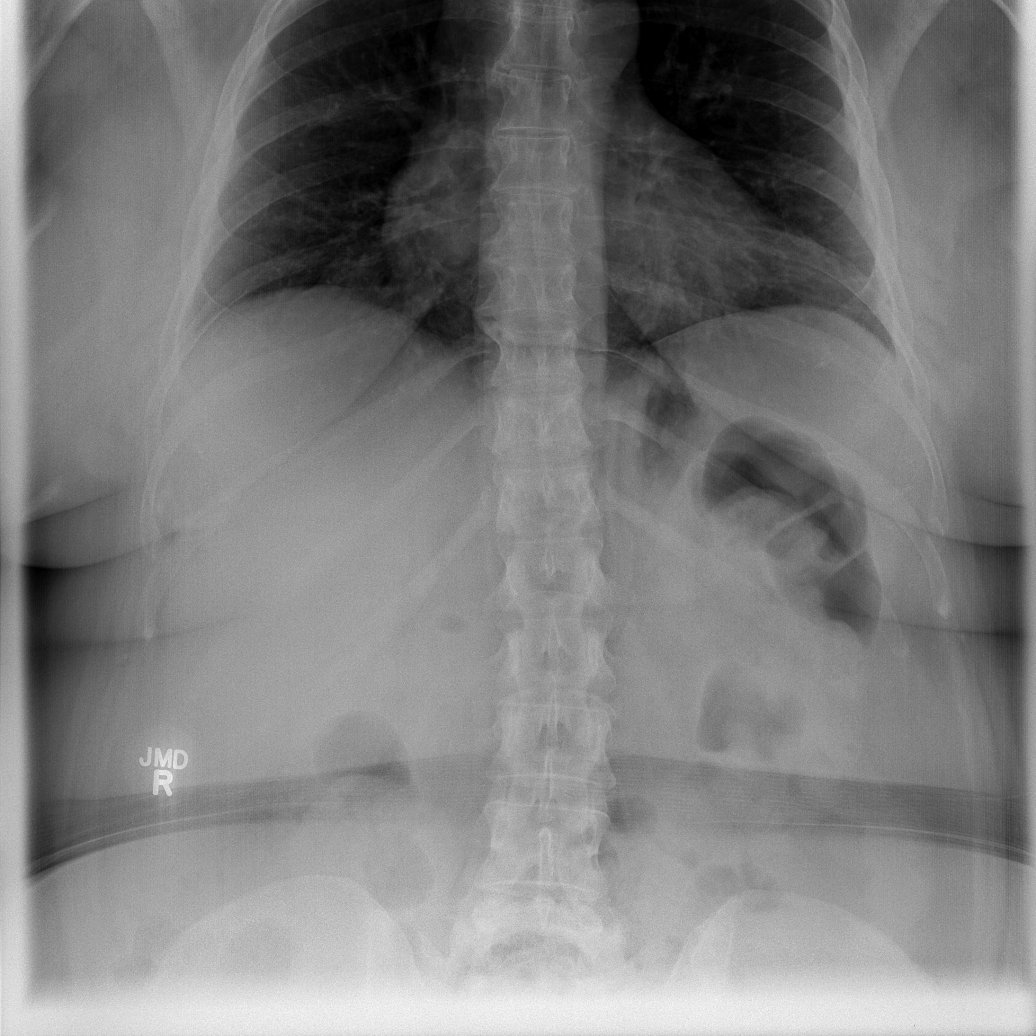

[t abdomen supine (1 of 2)]
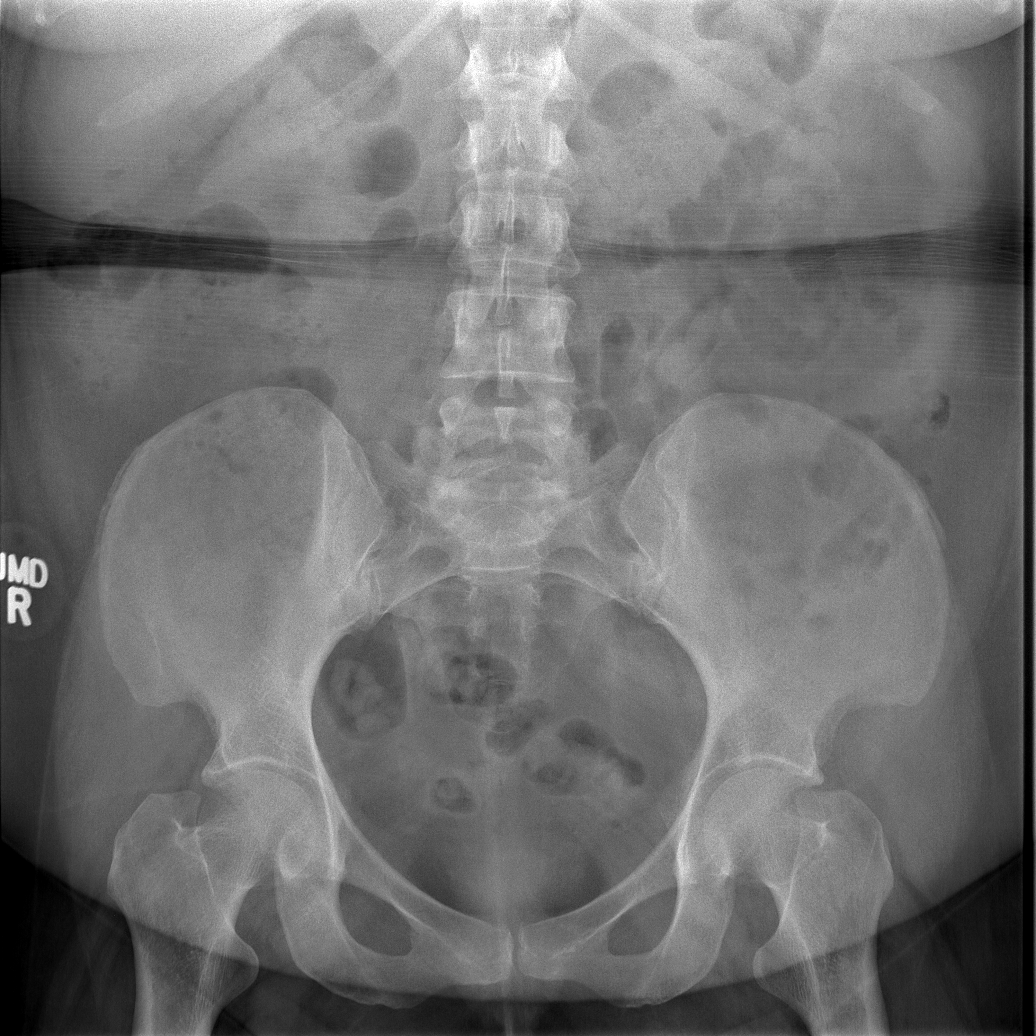

[t abdomen supine (2 of 2)]
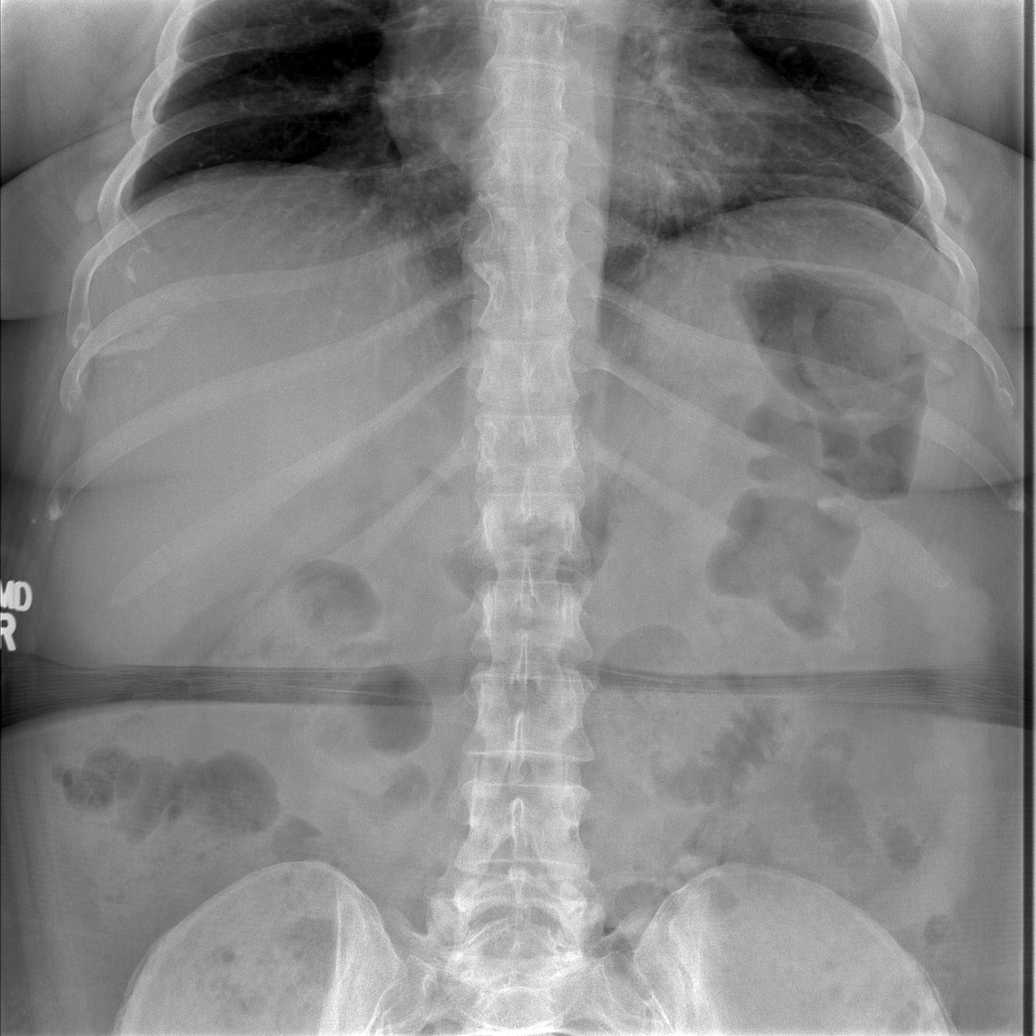

[4 of 4 positions shown; findings below may reference images not displayed]

FINDINGS: Normal cardiac and mediastinal silhouette.  Clear lung
fields.  No bony abnormality.  Previous cervical fusion.

Normal intestinal gas pattern.  No obstruction or free air.  No
visible abnormal calcifications.  Normal osseous structures.
IMPRESSION: No active cardiopulmonary disease.  No acute intra-abdominal
findings.  No significant change from priors.

## 2012-11-27 ENCOUNTER — Ambulatory Visit
Admission: RE | Admit: 2012-11-27 | Discharge: 2012-11-27 | Disposition: A | Discharge status: Home / Self Care | Payer: BC Managed Care – PPO | Source: Ambulatory Visit | Attending: Obstetrics and Gynecology | Admitting: Obstetrics and Gynecology

## 2012-11-27 DIAGNOSIS — N63 Unspecified lump in unspecified breast: Secondary | ICD-10-CM

## 2013-05-02 ENCOUNTER — Emergency Department (HOSPITAL_COMMUNITY)
Admission: EM | Admit: 2013-05-02 | Discharge: 2013-05-02 | Disposition: A | Discharge status: Home / Self Care | Payer: BC Managed Care – PPO | Attending: Emergency Medicine | Admitting: Emergency Medicine

## 2013-05-02 ENCOUNTER — Other Ambulatory Visit: Payer: Self-pay

## 2013-05-02 ENCOUNTER — Encounter (HOSPITAL_COMMUNITY): Payer: Self-pay | Admitting: Emergency Medicine

## 2013-05-02 ENCOUNTER — Emergency Department (HOSPITAL_COMMUNITY): Payer: BC Managed Care – PPO

## 2013-05-02 DIAGNOSIS — K219 Gastro-esophageal reflux disease without esophagitis: Secondary | ICD-10-CM | POA: Insufficient documentation

## 2013-05-02 DIAGNOSIS — R110 Nausea: Secondary | ICD-10-CM | POA: Insufficient documentation

## 2013-05-02 DIAGNOSIS — N189 Chronic kidney disease, unspecified: Secondary | ICD-10-CM | POA: Insufficient documentation

## 2013-05-02 DIAGNOSIS — R109 Unspecified abdominal pain: Secondary | ICD-10-CM

## 2013-05-02 DIAGNOSIS — E669 Obesity, unspecified: Secondary | ICD-10-CM | POA: Insufficient documentation

## 2013-05-02 DIAGNOSIS — Z87442 Personal history of urinary calculi: Secondary | ICD-10-CM | POA: Insufficient documentation

## 2013-05-02 DIAGNOSIS — I129 Hypertensive chronic kidney disease with stage 1 through stage 4 chronic kidney disease, or unspecified chronic kidney disease: Secondary | ICD-10-CM | POA: Insufficient documentation

## 2013-05-02 DIAGNOSIS — R1013 Epigastric pain: Secondary | ICD-10-CM | POA: Insufficient documentation

## 2013-05-02 DIAGNOSIS — Z9851 Tubal ligation status: Secondary | ICD-10-CM | POA: Insufficient documentation

## 2013-05-02 DIAGNOSIS — Z862 Personal history of diseases of the blood and blood-forming organs and certain disorders involving the immune mechanism: Secondary | ICD-10-CM | POA: Insufficient documentation

## 2013-05-02 DIAGNOSIS — Z9889 Other specified postprocedural states: Secondary | ICD-10-CM | POA: Insufficient documentation

## 2013-05-02 DIAGNOSIS — R079 Chest pain, unspecified: Secondary | ICD-10-CM | POA: Insufficient documentation

## 2013-05-02 DIAGNOSIS — Z90710 Acquired absence of both cervix and uterus: Secondary | ICD-10-CM | POA: Insufficient documentation

## 2013-05-02 DIAGNOSIS — Z8739 Personal history of other diseases of the musculoskeletal system and connective tissue: Secondary | ICD-10-CM | POA: Insufficient documentation

## 2013-05-02 DIAGNOSIS — Z8719 Personal history of other diseases of the digestive system: Secondary | ICD-10-CM | POA: Insufficient documentation

## 2013-05-02 DIAGNOSIS — Z87440 Personal history of urinary (tract) infections: Secondary | ICD-10-CM | POA: Insufficient documentation

## 2013-05-02 DIAGNOSIS — Z880 Allergy status to penicillin: Secondary | ICD-10-CM | POA: Insufficient documentation

## 2013-05-02 DIAGNOSIS — E119 Type 2 diabetes mellitus without complications: Secondary | ICD-10-CM | POA: Insufficient documentation

## 2013-05-02 LAB — CBC WITH DIFFERENTIAL/PLATELET
Basophils Absolute: 0 10*3/uL (ref 0.0–0.1)
Eosinophils Relative: 6 % — ABNORMAL HIGH (ref 0–5)
Lymphocytes Relative: 34 % (ref 12–46)
MCV: 88 fL (ref 78.0–100.0)
Neutrophils Relative %: 53 % (ref 43–77)
Platelets: 357 10*3/uL (ref 150–400)
RDW: 13.5 % (ref 11.5–15.5)
WBC: 12.3 10*3/uL — ABNORMAL HIGH (ref 4.0–10.5)

## 2013-05-02 LAB — COMPREHENSIVE METABOLIC PANEL
ALT: 14 U/L (ref 0–35)
AST: 14 U/L (ref 0–37)
CO2: 28 mEq/L (ref 19–32)
Calcium: 9.4 mg/dL (ref 8.4–10.5)
GFR calc non Af Amer: >90 mL/min (ref >90)
Sodium: 139 mEq/L (ref 135–145)
Total Protein: 7.4 g/dL (ref 6.0–8.3)

## 2013-05-02 LAB — URINALYSIS W MICROSCOPIC + REFLEX CULTURE
Glucose, UA: NEGATIVE mg/dL
Specific Gravity, Urine: 1.025 (ref 1.005–1.030)
Urobilinogen, UA: 0.2 mg/dL (ref 0.0–1.0)
pH: 6.5 (ref 5.0–8.0)

## 2013-05-02 MED ORDER — ONDANSETRON 8 MG PO TBDP
8.0000 mg | ORAL_TABLET | Freq: Once | ORAL | Status: AC
  Administered 2013-05-02: 8 mg via ORAL
  Filled 2013-05-02: qty 1

## 2013-05-02 MED ORDER — FAMOTIDINE 20 MG PO TABS
40.0000 mg | ORAL_TABLET | Freq: Once | ORAL | Status: AC
  Administered 2013-05-02: 40 mg via ORAL
  Filled 2013-05-02: qty 2

## 2013-05-02 MED ORDER — GI COCKTAIL ~~LOC~~
30.0000 mL | Freq: Once | ORAL | Status: AC
  Administered 2013-05-02: 30 mL via ORAL
  Filled 2013-05-02: qty 30

## 2013-05-02 MED ORDER — ONDANSETRON HCL 4 MG PO TABS: 4.0000 mg | ORAL_TABLET | Freq: Three times a day (TID) | ORAL | Status: DC | PRN

## 2013-05-02 NOTE — ED Provider Notes (Signed)
History    CSN: [id number] Arrival date & time 05/02/13  1816  First MD Initiated Contact with Patient 05/02/13 1949     Chief Complaint  Patient presents with  . Chest Pain  . Abdominal Pain  . Gastrophageal Reflux    HPI Pt was seen at 2005.   Per pt, c/o gradual onset and persistence of constant upper abd "pain" for the past 2 to 3 days.  Has been associated with nausea.  Describes the abd pain as "burning" and "like my reflux pain," which radiates up into her chest.  States the pain began after "I've been eating a lot of spearmint candies" this past week, which is not per her usual. States she stopped taking her PPI "a while ago" because she was "feeling better." Denies vomiting/diarrhea, no fevers, no back pain, no rash, no CP/palpitations, no SOB/cough, no black or blood in stools.       Past Medical History  Diagnosis Date  . Henoch-Schonlein purpura   . Hypertension   . IBS (irritable bowel syndrome)   . History of kidney stones   . Obesity   . Arthritis   . Diabetes mellitus   . History of UTI   . Chronic kidney disease   . Esophagitis 2012    EGD   Past Surgical History  Procedure Laterality Date  . Abdominal hysterectomy    . Tubal ligation    . Cesarean section    . Knee surgery    . Tonsilectomy, adenoidectomy, bilateral myringotomy and tubes    . Neck surgery    . Tonsillectomy    . Achilles tendon surgery     Family History  Problem Relation Age of Onset  . Colon cancer Neg Hx   . Leukemia Mother   . Diabetes Mother   . Esophageal cancer Brother   . Diabetes Maternal Aunt   . Diabetes Maternal Uncle   . Prostate cancer Maternal Grandmother   . Diabetes Maternal Grandfather    History  Substance Use Topics  . Smoking status: Never Smoker   . Smokeless tobacco: Never Used  . Alcohol Use: 0.6 oz/week    1 Glasses of wine per week     Comment: 2 a month     Review of Systems ROS: Statement: All systems negative except as marked or noted in  the HPI; Constitutional: Negative for fever and chills. ; ; Eyes: Negative for eye pain, redness and discharge. ; ; ENMT: Negative for ear pain, hoarseness, nasal congestion, sinus pressure and sore throat. ; ; Cardiovascular: +CP. Negative for palpitations, diaphoresis, dyspnea and peripheral edema. ; ; Respiratory: Negative for cough, wheezing and stridor. ; ; Gastrointestinal: +abd pain, nausea. Negative for vomiting, diarrhea, blood in stool, hematemesis, jaundice and rectal bleeding. . ; ; Genitourinary: Negative for dysuria, flank pain and hematuria. ; ; Musculoskeletal: Negative for back pain and neck pain. Negative for swelling and trauma.; ; Skin: Negative for pruritus, rash, abrasions, blisters, bruising and skin lesion.; ; Neuro: Negative for headache, lightheadedness and neck stiffness. Negative for weakness, altered level of consciousness , altered mental status, extremity weakness, paresthesias, involuntary movement, seizure and syncope.       Allergies  Septra; Sulfa antibiotics; Cephalexin; Ciprocin-fluocin-procin; Bee venom; Levofloxacin; and Penicillins  Home Medications  No current outpatient prescriptions on file. BP 117/58  Pulse 77  Temp(Src) 97.4 F (36.3 C) (Oral)  Resp 18  SpO2 96% Physical Exam 2010: Physical examination:  Nursing notes reviewed; Vital signs and  O2 SAT reviewed;  Constitutional: Well developed, Well nourished, Well hydrated, In no acute distress; Head:  Normocephalic, atraumatic; Eyes: EOMI, PERRL, No scleral icterus; ENMT: Mouth and pharynx normal, Mucous membranes moist; Neck: Supple, Full range of motion, No lymphadenopathy; Cardiovascular: Regular rate and rhythm, No murmur, rub, or gallop; Respiratory: Breath sounds clear & equal bilaterally, No rales, rhonchi, wheezes.  Speaking full sentences with ease, Normal respiratory effort/excursion; Chest: Nontender, Movement normal; Abdomen: Soft, +mid-epigastric area tender to palp. No rebound or guarding.  Nondistended, Normal bowel sounds; Genitourinary: No CVA tenderness; Extremities: Pulses normal, No tenderness, No edema, No calf edema or asymmetry.; Neuro: AA&Ox3, Major CN grossly intact.  Speech clear. No gross focal motor or sensory deficits in extremities.; Skin: Color normal, Warm, Dry.   ED Course  Procedures     MDM  MDM Reviewed: previous chart, nursing note and vitals Reviewed previous: labs Interpretation: labs, ECG and x-ray    Date: 05/02/2013  Rate: 81  Rhythm: normal sinus rhythm  QRS Axis: normal  Intervals: normal  ST/T Wave abnormalities: normal  Conduction Disutrbances:none  Narrative Interpretation:   Old EKG Reviewed: none available.  Results for orders placed during the hospital encounter of 05/02/13  URINALYSIS W MICROSCOPIC + REFLEX CULTURE      Result Value Range   Color, Urine YELLOW  YELLOW   APPearance CLEAR  CLEAR   Specific Gravity, Urine 1.025  1.005 - 1.030   pH 6.5  5.0 - 8.0   Glucose, UA NEGATIVE  NEGATIVE mg/dL   Hgb urine dipstick MODERATE (*) NEGATIVE   Bilirubin Urine NEGATIVE  NEGATIVE   Ketones, ur NEGATIVE  NEGATIVE mg/dL   Protein, ur 30 (*) NEGATIVE mg/dL   Urobilinogen, UA 0.2  0.0 - 1.0 mg/dL   Nitrite NEGATIVE  NEGATIVE   Leukocytes, UA NEGATIVE  NEGATIVE   WBC, UA 0-2  <3 WBC/hpf   RBC / HPF 11-20  <3 RBC/hpf   Bacteria, UA FEW (*) RARE   Squamous Epithelial / LPF FEW (*) RARE  CBC WITH DIFFERENTIAL      Result Value Range   WBC 12.3 (*) 4.0 - 10.5 K/uL   RBC 4.90  3.87 - 5.11 MIL/uL   Hemoglobin 14.2  12.0 - 15.0 g/dL   HCT 16.1  09.6 - 04.5 %   MCV 88.0  78.0 - 100.0 fL   MCH 29.0  26.0 - 34.0 pg   MCHC 32.9  30.0 - 36.0 g/dL   RDW 40.9  81.1 - 91.4 %   Platelets 357  150 - 400 K/uL   Neutrophils Relative % 53  43 - 77 %   Neutro Abs 6.5  1.7 - 7.7 K/uL   Lymphocytes Relative 34  12 - 46 %   Lymphs Abs 4.2 (*) 0.7 - 4.0 K/uL   Monocytes Relative 7  3 - 12 %   Monocytes Absolute 0.8  0.1 - 1.0 K/uL    Eosinophils Relative 6 (*) 0 - 5 %   Eosinophils Absolute 0.8 (*) 0.0 - 0.7 K/uL   Basophils Relative 0  0 - 1 %   Basophils Absolute 0.0  0.0 - 0.1 K/uL  COMPREHENSIVE METABOLIC PANEL      Result Value Range   Sodium 139  135 - 145 mEq/L   Potassium 3.7  3.5 - 5.1 mEq/L   Chloride 104  96 - 112 mEq/L   CO2 28  19 - 32 mEq/L   Glucose, Bld 118 (*) 70 - 99  mg/dL   BUN 12  6 - 23 mg/dL   Creatinine, Ser 4.09  0.50 - 1.10 mg/dL   Calcium 9.4  8.4 - 81.1 mg/dL   Total Protein 7.4  6.0 - 8.3 g/dL   Albumin 3.5  3.5 - 5.2 g/dL   AST 14  0 - 37 U/L   ALT 14  0 - 35 U/L   Alkaline Phosphatase 101  39 - 117 U/L   Total Bilirubin 0.1 (*) 0.3 - 1.2 mg/dL   GFR calc non Af Amer >90  >90 mL/min   GFR calc Af Amer >90  >90 mL/min  LIPASE, BLOOD      Result Value Range   Lipase 25  11 - 59 U/L  TROPONIN I      Result Value Range   Troponin I <0.30  <0.30 ng/mL   Dg Abd Acute W/chest 05/02/2013   *RADIOLOGY REPORT*  Clinical Data: Chest and abdominal pain.  ACUTE ABDOMEN SERIES (ABDOMEN 2 VIEW & CHEST 1 VIEW)  Comparison: 01/07/2012 chest radiograph and 04/04/2012 CT  Findings: The cardiomediastinal silhouette is unremarkable. Mild peribronchial thickening is again noted. There is no evidence of airspace disease, pleural effusion, mass or pneumothorax.  The bowel gas pattern is unremarkable. There is no evidence of bowel obstruction or pneumoperitoneum. No suspicious calcifications are identified. No acute bony abnormalities are noted.  IMPRESSION: No evidence of acute abnormality.   Original Report Authenticated By: Harmon Pier, M.D.     2245:  Feels better after meds and wants to go home now. Doubt PE as cause for symptoms with low risk Wells. Doubt ACS as cause for symptoms with normal troponin and EKG after 2 days of constant symptoms. Dx and testing d/w pt.  Questions answered.  Verb understanding, agreeable to d/c home with outpt f/u.     Laray Anger, DO 05/03/13 1536

## 2013-05-02 NOTE — ED Notes (Signed)
Pt c/o chest pain from acid reflux with burning and nausea.

## 2013-05-02 NOTE — ED Notes (Signed)
Discharge instructions given and reviewed with patient.  Prescription given for Zofran; effects and use explained.  Patient verbalized understanding to follow up with her PMD and GI MD.  Patient ambulatory; discharged home in good condition.

## 2013-09-30 ENCOUNTER — Encounter: Payer: Self-pay | Admitting: Nurse Practitioner

## 2013-09-30 ENCOUNTER — Ambulatory Visit (INDEPENDENT_AMBULATORY_CARE_PROVIDER_SITE_OTHER): Payer: BC Managed Care – PPO | Admitting: Nurse Practitioner

## 2013-09-30 VITALS — BP 112/78 | HR 83 | Ht 63.0 in | Wt 233.0 lb

## 2013-09-30 DIAGNOSIS — R140 Abdominal distension (gaseous): Secondary | ICD-10-CM | POA: Insufficient documentation

## 2013-09-30 DIAGNOSIS — R141 Gas pain: Secondary | ICD-10-CM

## 2013-09-30 DIAGNOSIS — R109 Unspecified abdominal pain: Secondary | ICD-10-CM

## 2013-09-30 DIAGNOSIS — R634 Abnormal weight loss: Secondary | ICD-10-CM | POA: Insufficient documentation

## 2013-09-30 MED ORDER — OMEPRAZOLE 40 MG PO CPDR
40.0000 mg | DELAYED_RELEASE_CAPSULE | Freq: Every day | ORAL | Status: DC
Start: 2013-09-30 — End: 2014-01-26

## 2013-09-30 NOTE — Progress Notes (Signed)
     History of Present Illness:   Patient is a 47 year old female known to Dr. Arlyce Dice from a previous upper endoscopy May 2012, done for evaluation of chest pain. Findings included mild esophagitis.  Patient is here today for evaluation of a one-month history of abdominal cramps, bloating and gas. Symptoms are unrelated to meals. She has no associated bowel habit changes. No blood in stool. She does describe some associated nausea. These episodes occur several times a week, lasts for a variable amount of time. Belching helps. Patient saw primary care physician last week for described symptoms. She was given a course of Cipro and referred here . Labs from that visit dated 09/25/13 include a unremarkable CBC. Her thyroid-stimulating hormone was mildly elevated at 5.1. Hemoglobin A1c mildly elevated 6.4%. Lliver function studies were normal. Renal function was normal.   Current Medications, Allergies, Past Medical History, Past Surgical History, Family History and Social History were reviewed in [employer] record.  Studies:  HIDA June 2012  IMPRESSION:  There is demonstration of patency of the common bile duct and the cystic duct. There is no evidence of cholecystitis.  During CCK infusion the gallbladder contracted 17.6%. This is abnormally low. 30% or greater is normal range.  During CCK infusion the patient reported no recreation of symptoms. The patient reported experiencing no symptoms   Physical Exam: General: Pleasant, well developed , white female in no acute distress Head: Normocephalic and atraumatic Eyes:  sclerae anicteric, conjunctiva pink  Ears: Normal auditory acuity Lungs: Clear throughout to auscultation Heart: Regular rate and rhythm Abdomen: Soft, non distended, mild left lower quadrant and left mid abdominal tenderness . No masses, no hepatomegaly. Normal bowel sounds Musculoskeletal: Symmetrical with no gross deformities  Extremities: No edema    Neurological: Alert oriented x 4, grossly nonfocal Psychological:  Alert and cooperative. Normal mood and affect    Assessment and Recommendations:  38. 47 year old female with a two-month history of intermittent abdominal cramps, mainly left mid abdomen and associated with nausea and bloating but no bowel changes. Symptoms not related to food though belching helps. With exception of mild hypothyroidism, recent labs unremarkable. She does describe some features of early satiety and a 10 pound weight loss over the last several weeks. Etiology of symptoms not clear. Interestingly she did improve following a recent course of Cipro. At this point will try a daily PPI and have her return to clinic in approximately 3 weeks. Depending on clinical course she may need further workup. Of note, her gallbladder emptying study was abnormal (17%) on HIDA 2 years ago.  2. Hx of Henoch-Schonlein purpura treated with steroids.  Doubt vasculitis as cause for #1.

## 2013-09-30 NOTE — Patient Instructions (Signed)
We have sent the following medications to your pharmacy for you to pick up at your convenience:  Omeprazole  Please follow up with Willette Cluster in about 3 weeks.

## 2013-10-01 NOTE — Progress Notes (Signed)
Reviewed and agree with management. Davontay Watlington D. Bailley Guilford, M.D., FACG  

## 2013-10-31 ENCOUNTER — Other Ambulatory Visit: Payer: Self-pay

## 2013-10-31 DIAGNOSIS — Z1231 Encounter for screening mammogram for malignant neoplasm of breast: Secondary | ICD-10-CM

## 2013-11-19 ENCOUNTER — Ambulatory Visit
Admission: RE | Admit: 2013-11-19 | Discharge: 2013-11-19 | Disposition: A | Discharge status: Home / Self Care | Payer: BC Managed Care – PPO | Source: Ambulatory Visit

## 2013-11-19 DIAGNOSIS — Z1231 Encounter for screening mammogram for malignant neoplasm of breast: Secondary | ICD-10-CM

## 2014-01-26 ENCOUNTER — Encounter (HOSPITAL_COMMUNITY): Payer: Self-pay | Admitting: Emergency Medicine

## 2014-01-26 ENCOUNTER — Emergency Department (HOSPITAL_COMMUNITY): Payer: BC Managed Care – PPO

## 2014-01-26 ENCOUNTER — Observation Stay (HOSPITAL_COMMUNITY)
Admission: EM | Admit: 2014-01-26 | Discharge: 2014-01-27 | Disposition: A | Discharge status: Home / Self Care | Payer: BC Managed Care – PPO | Attending: Family Medicine | Admitting: Family Medicine

## 2014-01-26 DIAGNOSIS — I129 Hypertensive chronic kidney disease with stage 1 through stage 4 chronic kidney disease, or unspecified chronic kidney disease: Secondary | ICD-10-CM | POA: Insufficient documentation

## 2014-01-26 DIAGNOSIS — D690 Allergic purpura: Secondary | ICD-10-CM

## 2014-01-26 DIAGNOSIS — Z981 Arthrodesis status: Secondary | ICD-10-CM | POA: Insufficient documentation

## 2014-01-26 DIAGNOSIS — R634 Abnormal weight loss: Secondary | ICD-10-CM

## 2014-01-26 DIAGNOSIS — R519 Headache, unspecified: Secondary | ICD-10-CM

## 2014-01-26 DIAGNOSIS — K589 Irritable bowel syndrome without diarrhea: Secondary | ICD-10-CM | POA: Insufficient documentation

## 2014-01-26 DIAGNOSIS — N189 Chronic kidney disease, unspecified: Secondary | ICD-10-CM | POA: Insufficient documentation

## 2014-01-26 DIAGNOSIS — R140 Abdominal distension (gaseous): Secondary | ICD-10-CM

## 2014-01-26 DIAGNOSIS — R29810 Facial weakness: Secondary | ICD-10-CM

## 2014-01-26 DIAGNOSIS — R51 Headache: Secondary | ICD-10-CM

## 2014-01-26 DIAGNOSIS — G510 Bell's palsy: Principal | ICD-10-CM

## 2014-01-26 DIAGNOSIS — M129 Arthropathy, unspecified: Secondary | ICD-10-CM | POA: Insufficient documentation

## 2014-01-26 DIAGNOSIS — E669 Obesity, unspecified: Secondary | ICD-10-CM | POA: Insufficient documentation

## 2014-01-26 DIAGNOSIS — R200 Anesthesia of skin: Secondary | ICD-10-CM

## 2014-01-26 DIAGNOSIS — R1013 Epigastric pain: Secondary | ICD-10-CM

## 2014-01-26 DIAGNOSIS — E119 Type 2 diabetes mellitus without complications: Secondary | ICD-10-CM | POA: Insufficient documentation

## 2014-01-26 DIAGNOSIS — R109 Unspecified abdominal pain: Secondary | ICD-10-CM

## 2014-01-26 LAB — I-STAT CHEM 8, ED
BUN: 7 mg/dL (ref 6–23)
CALCIUM ION: 1.17 mmol/L (ref 1.12–1.23)
CREATININE: 0.6 mg/dL (ref 0.50–1.10)
Chloride: 105 mEq/L (ref 96–112)
GLUCOSE: 127 mg/dL — AB (ref 70–99)
HEMATOCRIT: 43 % (ref 36.0–46.0)
HEMOGLOBIN: 14.6 g/dL (ref 12.0–15.0)
POTASSIUM: 3.6 meq/L — AB (ref 3.7–5.3)
Sodium: 142 mEq/L (ref 137–147)
TCO2: 22 mmol/L (ref 0–100)

## 2014-01-26 LAB — COMPREHENSIVE METABOLIC PANEL
ALT: 10 U/L (ref 0–35)
AST: 13 U/L (ref 0–37)
Albumin: 3.3 g/dL — ABNORMAL LOW (ref 3.5–5.2)
Alkaline Phosphatase: 84 U/L (ref 39–117)
BILIRUBIN TOTAL: 0.3 mg/dL (ref 0.3–1.2)
BUN: 9 mg/dL (ref 6–23)
CALCIUM: 8.8 mg/dL (ref 8.4–10.5)
CHLORIDE: 103 meq/L (ref 96–112)
CO2: 23 meq/L (ref 19–32)
Creatinine, Ser: 0.56 mg/dL (ref 0.50–1.10)
GFR calc non Af Amer: >90 mL/min (ref >90)
Glucose, Bld: 133 mg/dL — ABNORMAL HIGH (ref 70–99)
Potassium: 3.9 mEq/L (ref 3.7–5.3)
Sodium: 139 mEq/L (ref 137–147)
Total Protein: 7.1 g/dL (ref 6.0–8.3)

## 2014-01-26 LAB — RAPID URINE DRUG SCREEN, HOSP PERFORMED
AMPHETAMINES: NOT DETECTED
Barbiturates: NOT DETECTED
Benzodiazepines: NOT DETECTED
Cocaine: NOT DETECTED
Opiates: NOT DETECTED
Tetrahydrocannabinol: NOT DETECTED

## 2014-01-26 LAB — DIFFERENTIAL
BASOS ABS: 0 10*3/uL (ref 0.0–0.1)
BASOS PCT: 0 % (ref 0–1)
EOS PCT: 1 % (ref 0–5)
Eosinophils Absolute: 0 10*3/uL (ref 0.0–0.7)
Lymphocytes Relative: 29 % (ref 12–46)
Lymphs Abs: 2.3 10*3/uL (ref 0.7–4.0)
MONO ABS: 0.6 10*3/uL (ref 0.1–1.0)
MONOS PCT: 7 % (ref 3–12)
NEUTROS ABS: 5.1 10*3/uL (ref 1.7–7.7)
Neutrophils Relative %: 64 % (ref 43–77)

## 2014-01-26 LAB — URINALYSIS, ROUTINE W REFLEX MICROSCOPIC
Bilirubin Urine: NEGATIVE
Glucose, UA: NEGATIVE mg/dL
Ketones, ur: NEGATIVE mg/dL
LEUKOCYTES UA: NEGATIVE
Nitrite: NEGATIVE
PROTEIN: NEGATIVE mg/dL
SPECIFIC GRAVITY, URINE: 1.01 (ref 1.005–1.030)
UROBILINOGEN UA: 0.2 mg/dL (ref 0.0–1.0)
pH: 7 (ref 5.0–8.0)

## 2014-01-26 LAB — ETHANOL: Alcohol, Ethyl (B): <11 mg/dL (ref 0–11)

## 2014-01-26 LAB — PROTIME-INR
INR: 1.04 (ref 0.00–1.49)
Prothrombin Time: 13.4 seconds (ref 11.6–15.2)

## 2014-01-26 LAB — I-STAT TROPONIN, ED: Troponin i, poc: 0 ng/mL (ref 0.00–0.08)

## 2014-01-26 LAB — CBC
HEMATOCRIT: 40.7 % (ref 36.0–46.0)
HEMOGLOBIN: 13.4 g/dL (ref 12.0–15.0)
MCH: 28.6 pg (ref 26.0–34.0)
MCHC: 32.9 g/dL (ref 30.0–36.0)
MCV: 86.8 fL (ref 78.0–100.0)
Platelets: 325 10*3/uL (ref 150–400)
RBC: 4.69 MIL/uL (ref 3.87–5.11)
RDW: 13.3 % (ref 11.5–15.5)
WBC: 8.1 10*3/uL (ref 4.0–10.5)

## 2014-01-26 LAB — URINE MICROSCOPIC-ADD ON

## 2014-01-26 LAB — GLUCOSE, CAPILLARY
GLUCOSE-CAPILLARY: 139 mg/dL — AB (ref 70–99)
Glucose-Capillary: 201 mg/dL — ABNORMAL HIGH (ref 70–99)

## 2014-01-26 LAB — APTT: aPTT: 28 seconds (ref 24–37)

## 2014-01-26 MED ORDER — ACETAMINOPHEN 325 MG PO TABS
650.0000 mg | ORAL_TABLET | Freq: Four times a day (QID) | ORAL | Status: DC | PRN
  Administered 2014-01-26: 650 mg via ORAL
  Filled 2014-01-26: qty 2

## 2014-01-26 MED ORDER — PROMETHAZINE HCL 25 MG/ML IJ SOLN
25.0000 mg | Freq: Once | INTRAMUSCULAR | Status: AC
  Administered 2014-01-26: 25 mg via INTRAVENOUS
  Filled 2014-01-26: qty 1

## 2014-01-26 MED ORDER — PREDNISONE 20 MG PO TABS
60.0000 mg | ORAL_TABLET | Freq: Every day | ORAL | Status: DC
  Administered 2014-01-26 – 2014-01-27 (×2): 60 mg via ORAL
  Filled 2014-01-26 (×2): qty 3

## 2014-01-26 MED ORDER — VALACYCLOVIR HCL 500 MG PO TABS
1000.0000 mg | ORAL_TABLET | Freq: Three times a day (TID) | ORAL | Status: DC
  Administered 2014-01-26 – 2014-01-27 (×2): 1000 mg via ORAL
  Filled 2014-01-26 (×9): qty 2

## 2014-01-26 MED ORDER — ONDANSETRON HCL 4 MG/2ML IJ SOLN: 4.0000 mg | Freq: Four times a day (QID) | INTRAMUSCULAR | Status: DC | PRN

## 2014-01-26 MED ORDER — ACETAMINOPHEN 650 MG RE SUPP: 650.0000 mg | Freq: Four times a day (QID) | RECTAL | Status: DC | PRN

## 2014-01-26 MED ORDER — ONDANSETRON HCL 4 MG PO TABS: 4.0000 mg | ORAL_TABLET | Freq: Four times a day (QID) | ORAL | Status: DC | PRN

## 2014-01-26 MED ORDER — INSULIN ASPART 100 UNIT/ML ~~LOC~~ SOLN
0.0000 [IU] | Freq: Three times a day (TID) | SUBCUTANEOUS | Status: DC
Start: 2014-01-26 — End: 2014-01-27
  Administered 2014-01-27: 2 [IU] via SUBCUTANEOUS

## 2014-01-26 MED ORDER — SODIUM CHLORIDE 0.9 % IV SOLN: 250.0000 mL | INTRAVENOUS | Status: DC | PRN

## 2014-01-26 MED ORDER — PROCHLORPERAZINE EDISYLATE 5 MG/ML IJ SOLN: 10.0000 mg | Freq: Once | INTRAMUSCULAR | Status: DC

## 2014-01-26 MED ORDER — ASPIRIN 325 MG PO TABS
325.0000 mg | ORAL_TABLET | Freq: Every day | ORAL | Status: DC
  Administered 2014-01-26 – 2014-01-27 (×2): 325 mg via ORAL
  Filled 2014-01-26 (×2): qty 1

## 2014-01-26 MED ORDER — SODIUM CHLORIDE 0.9 % IJ SOLN: 3.0000 mL | Freq: Two times a day (BID) | INTRAMUSCULAR | Status: DC

## 2014-01-26 MED ORDER — SODIUM CHLORIDE 0.9 % IJ SOLN
3.0000 mL | Freq: Two times a day (BID) | INTRAMUSCULAR | Status: DC
  Administered 2014-01-26 – 2014-01-27 (×2): 3 mL via INTRAVENOUS

## 2014-01-26 MED ORDER — ASPIRIN 300 MG RE SUPP
300.0000 mg | Freq: Every day | RECTAL | Status: DC
  Filled 2014-01-26 (×4): qty 1

## 2014-01-26 MED ORDER — SODIUM CHLORIDE 0.9 % IJ SOLN: 3.0000 mL | INTRAMUSCULAR | Status: DC | PRN

## 2014-01-26 NOTE — H&P (Deleted)
History and Physical  Victoria LegacyMia M Williams RUE:[id number] DOB: 06/17/1966 DOA: 01/26/2014  Referring physician: Dr. Rubin PayorPickering in ED PCP: Colette RibasGOLDING, JOHN CABOT, MD   Chief Complaint: left sided facial droop  HPI:  48 year old woman presented to the emergency department today with left-sided facial weakness.  Patient reports feeling nauseous yesterday but focal marrow deficits. This morning, when she awoke, she noted left-sided facial weakness in the mirror. She has had difficulty speaking because of tongue weakness. She feels numbness on the left side of her face. No other neurologic deficits, no arm or leg weakness. No difficulty ambulating. She does report having had a headache one week ago which resolved spontaneously.  In the emergency department afebrile, vital signs are stable. Noted to have left-sided facial weakness. No hypoxia. CBC and basic metabolic panel were unremarkable. Glucose mildly elevated. Urinalysis, urine drug screen, serum alcohol levels unremarkable. CT of the head no acute abnormality. EKG showed normal sinus rhythm with no acute changes.  Review of Systems:    Past Medical History  Diagnosis Date  . Henoch-Schonlein purpura   . Hypertension   . IBS (irritable bowel syndrome)   . History of kidney stones   . Obesity   . Arthritis   . History of UTI   . Chronic kidney disease   . Esophagitis 2012    EGD  . Diabetes mellitus     only when on steriods    Past Surgical History  Procedure Laterality Date  . Abdominal hysterectomy    . Tubal ligation    . Cesarean section    . Knee surgery    . Tonsilectomy, adenoidectomy, bilateral myringotomy and tubes    . Neck surgery    . Tonsillectomy    . Achilles tendon surgery      Social History:  reports that she has never smoked. She has never used smokeless tobacco. She reports that she drinks about 0.6 ounces of alcohol per week. She reports that she does not use illicit drugs.  Allergies  Allergen Reactions  .  Septra [Bactrim] Nausea And Vomiting  . Sulfa Antibiotics Nausea And Vomiting  . Cephalexin Hives  . Ciprocin-Fluocin-Procin [Fluocinolone Acetonide] Hives  . Bee Venom Swelling    REACTION: Severe swelling  . Levofloxacin Hives  . Penicillins     Family History  Problem Relation Age of Onset  . Colon cancer Neg Hx   . Leukemia Mother   . Diabetes Mother   . Esophageal cancer Brother   . Diabetes Maternal Aunt   . Diabetes Maternal Uncle   . Prostate cancer Maternal Grandmother   . Diabetes Maternal Grandfather      Prior to Admission medications   Medication Sig Start Date End Date Taking? Authorizing Provider  acetaminophen (TYLENOL) 500 MG tablet Take 1,000 mg by mouth 2 (two) times daily as needed for headache.   Yes Historical Provider, MD  ibuprofen (ADVIL,MOTRIN) 200 MG tablet Take 600 mg by mouth 2 (two) times daily as needed for moderate pain.   Yes Historical Provider, MD   Physical Exam: Filed Vitals:   01/26/14 0810 01/26/14 0900 01/26/14 0902 01/26/14 1011  BP: 133/56  134/70 142/82  Pulse: 73  75 78  Temp:  98.1 F (36.7 C) 98.1 F (36.7 C)   TempSrc:   Oral   Resp: 15  14 18   Height:      Weight:      SpO2: 98%  98% 97%   General: Examined in the emergency department. Appears  calm and comfortable. Obvious left-sided facial weakness. Eyes: PERRL, normal lids, ptosis left lid. ENT: grossly normal hearing, lips & tongue Neck: no LAD, masses or thyromegaly Cardiovascular: RRR, no m/r/g. No LE edema. Respiratory: CTA bilaterally, no w/r/r. Normal respiratory effort. Abdomen: soft, ntnd Skin: no rash or induration seen  Musculoskeletal: grossly normal tone BUE/BLE. Strength 5/5 bilateral upper and lower extremities. Psychiatric: grossly normal mood and affect, speech fluent and appropriate Neurologic: Left-sided facial weakness including forehead with some ability to wrinkle the forehead, able to close the eye, loss of left nasolabial fold. No  dysdiadochokinesis of the upper extremities.  Wt Readings from Last 3 Encounters:  01/26/14 106.595 kg (235 lb)  09/30/13 105.688 kg (233 lb)  04/26/12 64.411 kg (142 lb)    Labs on Admission:  Basic Metabolic Panel:  Recent Labs Lab 01/26/14 0845 01/26/14 0924  NA 139 142  K 3.9 3.6*  CL 103 105  CO2 23  --   GLUCOSE 133* 127*  BUN 9 7  CREATININE 0.56 0.60  CALCIUM 8.8  --     Liver Function Tests:  Recent Labs Lab 01/26/14 0845  AST 13  ALT 10  ALKPHOS 84  BILITOT 0.3  PROT 7.1  ALBUMIN 3.3*    CBC:  Recent Labs Lab 01/26/14 0845 01/26/14 0924  WBC 8.1  --   NEUTROABS 5.1  --   HGB 13.4 14.6  HCT 40.7 43.0  MCV 86.8  --   PLT 325  --      Recent Labs  01/26/14 0922  TROPIPOC 0.00     Radiological Exams on Admission: Ct Head Wo Contrast  01/26/2014   CLINICAL DATA:  Left-sided weakness  EXAM: CT HEAD WITHOUT CONTRAST  TECHNIQUE: Contiguous axial images were obtained from the base of the skull through the vertex without intravenous contrast.  COMPARISON:  01/07/2012  FINDINGS: The bony calvarium is intact. The ventricles are of normal size and configuration. No findings to suggest acute hemorrhage, acute infarction or space-occupying mass lesion are noted.  IMPRESSION: No acute abnormality noted.   Electronically Signed   By: Alcide Clever M.D.   On: 01/26/2014 08:31     Principal Problem:   Facial weakness Active Problems:   Facial droop   Assessment/Plan 1. Right facial weakness. Exam and history highly suspicious for Bell's palsy. Stroke in differential but felt to be less likely. 2. History of steroid-induced hyperglycemia. 3. H/o HSP.   Treat for Bell's palsy with prednisone and valacyclovir.  Observation, MRI of the brain to rule out stroke, if negative no further evaluation. Start empiric aspirin, check lipid panel in the morning. Not a candidate for TPA secondary to delayed presentation, low likelihood of stroke.  Monitor blood  sugars on steroids.  Anticipate discharge 4/6.  Discussed above, laboratory and imaging results, treatment plan with patient, daughter, husband.  Code Status: full code  DVT prophylaxis: SCDs Family Communication:  Disposition Plan/Anticipated LOS: obs, 24 hours  Time spent: 55 minutes  Brendia Sacks, MD  [facility] Pager [phone] 01/26/2014, 10:49 AM

## 2014-01-26 NOTE — H&P (Signed)
History and Physical  Victoria Williams JXB:[id number] DOB: Dec 07, 1965 DOA: 01/26/2014  Referring physician: Dr. Rubin Payor in ED PCP: Colette Ribas, MD   Chief Complaint: left sided facial droop  HPI:  48 year old woman presented to the emergency department today with left-sided facial weakness.  Patient reports feeling nauseous yesterday but focal marrow deficits. This morning, when she awoke, she noted left-sided facial weakness in the mirror. She has had difficulty speaking because of tongue weakness. She feels numbness on the left side of her face. No other neurologic deficits, no arm or leg weakness. No difficulty ambulating. She does report having had a headache one week ago which resolved spontaneously.  In the emergency department afebrile, vital signs are stable. Noted to have left-sided facial weakness. No hypoxia. CBC and basic metabolic panel were unremarkable. Glucose mildly elevated. Urinalysis, urine drug screen, serum alcohol levels unremarkable. CT of the head no acute abnormality. EKG showed normal sinus rhythm with no acute changes.  Review of Systems:  No fever, visual changes, rash, new muscle aches, shortness of breath, chest pain today, abdominal pain, diarrhea, dysuria, bleeding.[name]  Positive for sore throat.  Past Medical History  Diagnosis Date  . Henoch-Schonlein purpura   . Hypertension   . IBS (irritable bowel syndrome)   . History of kidney stones   . Obesity   . Arthritis   . History of UTI   . Chronic kidney disease   . Esophagitis 2012    EGD  . Diabetes mellitus     only when on steriods    Past Surgical History  Procedure Laterality Date  . Abdominal hysterectomy    . Tubal ligation    . Cesarean section    . Knee surgery    . Tonsilectomy, adenoidectomy, bilateral myringotomy and tubes    . Neck surgery      cervical fusion 6/7  . Tonsillectomy    . Achilles tendon surgery      Social History:  reports that she has never smoked. She  has never used smokeless tobacco. She reports that she drinks about 0.6 ounces of alcohol per week. She reports that she does not use illicit drugs.  Allergies  Allergen Reactions  . Septra [Bactrim] Nausea And Vomiting  . Sulfa Antibiotics Nausea And Vomiting  . Cephalexin Hives  . Ciprocin-Fluocin-Procin [Fluocinolone Acetonide] Hives  . Bee Venom Swelling    REACTION: Severe swelling  . Levofloxacin Hives  . Penicillins     Family History  Problem Relation Age of Onset  . Colon cancer Neg Hx   . Leukemia Mother   . Diabetes Mother   . Esophageal cancer Brother   . Diabetes Maternal Aunt   . Diabetes Maternal Uncle   . Prostate cancer Maternal Grandmother   . Diabetes Maternal Grandfather      Prior to Admission medications   Medication Sig Start Date End Date Taking? Authorizing Provider  acetaminophen (TYLENOL) 500 MG tablet Take 1,000 mg by mouth 2 (two) times daily as needed for headache.   Yes Historical Provider, MD  ibuprofen (ADVIL,MOTRIN) 200 MG tablet Take 600 mg by mouth 2 (two) times daily as needed for moderate pain.   Yes Historical Provider, MD   Physical Exam: Filed Vitals:   01/26/14 0902 01/26/14 1011 01/26/14 1134 01/26/14 1211  BP: 134/70 142/82 128/61 143/84  Pulse: 75 78 77 75  Temp: 98.1 F (36.7 C)   99.3 F (37.4 C)  TempSrc: Oral   Oral  Resp: 14 18  16 17  Height:    5\' 4"  (1.626 m)  Weight:    106.5 kg (234 lb 12.6 oz)  SpO2: 98% 97% 96% 95%   General: Examined in the emergency department. Appears calm and comfortable. Obvious left-sided facial weakness. Eyes: PERRL, normal lids, ptosis left lid. ENT: grossly normal hearing, lips & tongue Neck: no LAD, masses or thyromegaly Cardiovascular: RRR, no m/r/g. No LE edema. Respiratory: CTA bilaterally, no w/r/r. Normal respiratory effort. Abdomen: soft, ntnd Skin: no rash or induration seen  Musculoskeletal: grossly normal tone BUE/BLE. Strength 5/5 bilateral upper and lower  extremities. Psychiatric: grossly normal mood and affect, speech fluent and appropriate Neurologic: Left-sided facial weakness including forehead with some ability to wrinkle the forehead, able to close the eye, loss of left nasolabial fold. No dysdiadochokinesis of the upper extremities.  Wt Readings from Last 3 Encounters:  01/26/14 106.5 kg (234 lb 12.6 oz)  09/30/13 105.688 kg (233 lb)  04/26/12 64.411 kg (142 lb)    Labs on Admission:  Basic Metabolic Panel:  Recent Labs Lab 01/26/14 0845 01/26/14 0924  NA 139 142  K 3.9 3.6*  CL 103 105  CO2 23  --   GLUCOSE 133* 127*  BUN 9 7  CREATININE 0.56 0.60  CALCIUM 8.8  --     Liver Function Tests:  Recent Labs Lab 01/26/14 0845  AST 13  ALT 10  ALKPHOS 84  BILITOT 0.3  PROT 7.1  ALBUMIN 3.3*    CBC:  Recent Labs Lab 01/26/14 0845 01/26/14 0924  WBC 8.1  --   NEUTROABS 5.1  --   HGB 13.4 14.6  HCT 40.7 43.0  MCV 86.8  --   PLT 325  --      Recent Labs  01/26/14 0922  TROPIPOC 0.00     Radiological Exams on Admission: Ct Head Wo Contrast  01/26/2014   CLINICAL DATA:  Left-sided weakness  EXAM: CT HEAD WITHOUT CONTRAST  TECHNIQUE: Contiguous axial images were obtained from the base of the skull through the vertex without intravenous contrast.  COMPARISON:  01/07/2012  FINDINGS: The bony calvarium is intact. The ventricles are of normal size and configuration. No findings to suggest acute hemorrhage, acute infarction or space-occupying mass lesion are noted.  IMPRESSION: No acute abnormality noted.   Electronically Signed   By: Alcide CleverMark  Lukens M.D.   On: 01/26/2014 08:31     Principal Problem:   Facial weakness Active Problems:   Facial droop   Assessment/Plan 1. Right facial weakness. Exam and history highly suspicious for Bell's palsy. Stroke in differential but felt to be less likely. 2. History of steroid-induced hyperglycemia. 3. H/o HSP.   Treat for Bell's palsy with prednisone and  valacyclovir.  Observation, MRI of the brain to rule out stroke, if negative no further evaluation. Start empiric aspirin, check lipid panel in the morning. Not a candidate for TPA secondary to delayed presentation, low likelihood of stroke.  Monitor blood sugars on steroids.  Anticipate discharge 4/6.  Discussed above, laboratory and imaging results, treatment plan with patient, daughter, husband.  Code Status: full code  DVT prophylaxis: SCDs Family Communication:  Disposition Plan/Anticipated LOS: obs, 24 hours  Time spent: 55 minutes  Brendia Sacksaniel Goodrich, MD  [facility] Pager [phone] 01/26/2014, 2:33 PM

## 2014-01-26 NOTE — ED Provider Notes (Signed)
CSN: [id number]     Arrival date & time 01/26/14  0732 History   First MD Initiated Contact with Patient 01/26/14 0736    This chart was scribed for [facility] R. Rubin Payor, MD by Marica Otter, ED Scribe. This patient was seen in room APA10/APA10 and the patient's care was started at 11:14 AM.  PCP: Colette Ribas, MD  Chief Complaint  Patient presents with  . Facial Droop   HPI  HPI Comments: Victoria Williams is a 48 y.o. female, with a history of HTN and DM, brought in by ambulance, who presents to the Emergency Department complaining of constant left sided facial droop with associated tingling behind the left eye and weakness of the left arm, onset this morning. Pt further states that she is unable to smile. Pt also complains of associated intermittent HA and chest pain onset yesterday. Pt clarifies that she has been experiencing intermittent HA for the past 2 weeks. Pt denies numbness of the upper extremities.    Past Medical History  Diagnosis Date  . Henoch-Schonlein purpura   . Hypertension   . IBS (irritable bowel syndrome)   . History of kidney stones   . Obesity   . Arthritis   . History of UTI   . Chronic kidney disease   . Esophagitis 2012    EGD  . Diabetes mellitus     only when on steriods   Past Surgical History  Procedure Laterality Date  . Abdominal hysterectomy    . Tubal ligation    . Cesarean section    . Knee surgery    . Tonsilectomy, adenoidectomy, bilateral myringotomy and tubes    . Neck surgery      cervical fusion 6/7  . Tonsillectomy    . Achilles tendon surgery     Family History  Problem Relation Age of Onset  . Colon cancer Neg Hx   . Leukemia Mother   . Diabetes Mother   . Esophageal cancer Brother   . Diabetes Maternal Aunt   . Diabetes Maternal Uncle   . Prostate cancer Maternal Grandmother   . Diabetes Maternal Grandfather    History  Substance Use Topics  . Smoking status: Never Smoker   . Smokeless tobacco: Never Used  .  Alcohol Use: 0.6 oz/week    1 Glasses of wine per week     Comment: 2 a month    OB History   Grav Para Term Preterm Abortions TAB SAB Ect Mult Living                 Review of Systems  HENT: Negative for drooling.   Neurological: Positive for weakness (left arm), numbness (left sided facial droop) and headaches. Negative for speech difficulty.       Tingling behind left eye      Allergies  Septra; Sulfa antibiotics; Cephalexin; Ciprocin-fluocin-procin; Bee venom; Levofloxacin; and Penicillins  Home Medications   Current Outpatient Rx  Name  Route  Sig  Dispense  Refill  . acetaminophen (TYLENOL) 500 MG tablet   Oral   Take 1,000 mg by mouth 2 (two) times daily as needed for headache.         . ibuprofen (ADVIL,MOTRIN) 200 MG tablet   Oral   Take 600 mg by mouth 2 (two) times daily as needed for moderate pain.          Triage Vitals: BP 155/78  Pulse 75  Temp(Src) 98.3 F (36.8 C) (Oral)  Resp 20  Ht 5\' 4"  (1.626 m)  Wt 235 lb (106.595 kg)  BMI 40.32 kg/m2  SpO2 100% Physical Exam  Eyes: EOM are normal.  Pulmonary/Chest:  Lungs clear  Abdominal: There is no tenderness.  Neurological:  Left sided facial droop, forehead involvement, normal ROM of tongue, paresis to left side of face films 1,2,and 3 of the 5th cranial nerve. Good shoulder shrug bilaterally. Sensations intact of the radial medial and ulnar distribution, bilateral hands. Good straight leg. Finger, nose intact bilaterally.     ED Course  Procedures (including critical care time)      COORDINATION OF CARE:  7:44 AM-Discussed treatment plan which includes CT scan, EKG, labs and possible MRI with pt at bedside and pt agreed to plan.   Labs Review Labs Reviewed  COMPREHENSIVE METABOLIC PANEL - Abnormal; Notable for the following:    Glucose, Bld 133 (*)    Albumin 3.3 (*)    All other components within normal limits  URINALYSIS, ROUTINE W REFLEX MICROSCOPIC - Abnormal; Notable for the  following:    Hgb urine dipstick SMALL (*)    All other components within normal limits  I-STAT CHEM 8, ED - Abnormal; Notable for the following:    Potassium 3.6 (*)    Glucose, Bld 127 (*)    All other components within normal limits  ETHANOL  PROTIME-INR  APTT  CBC  DIFFERENTIAL  URINE RAPID DRUG SCREEN (HOSP PERFORMED)  URINE MICROSCOPIC-ADD ON  I-STAT TROPOININ, ED   Imaging Review Ct Head Wo Contrast  01/26/2014   CLINICAL DATA:  Left-sided weakness  EXAM: CT HEAD WITHOUT CONTRAST  TECHNIQUE: Contiguous axial images were obtained from the base of the skull through the vertex without intravenous contrast.  COMPARISON:  01/07/2012  FINDINGS: The bony calvarium is intact. The ventricles are of normal size and configuration. No findings to suggest acute hemorrhage, acute infarction or space-occupying mass lesion are noted.  IMPRESSION: No acute abnormality noted.   Electronically Signed   By: Alcide CleverMark  Lukens M.D.   On: 01/26/2014 08:31     EKG Interpretation   Date/Time:  Sunday January 26 2014 08:05:15 EDT Ventricular Rate:  76 PR Interval:  122 QRS Duration: 92 QT Interval:  396 QTC Calculation: 445 R Axis:   46 Text Interpretation:  Normal sinus rhythm Normal ECG When compared with  ECG of 02-May-2013 18:30, No significant change was found Confirmed by  Rubin PayorPICKERING  MD, Matthias Bogus [phone]([id number]) on 01/26/2014 11:13:55 AM      MDM   Final diagnoses:  Facial droop  Numbness  Headache    Patient has a left-sided facial tube is forehead involvement. Also complaining of left-sided paresthesias and weakness. Neurologic exam does show some paresthesias in the face, however the exam appears normal on the upper and lower extremities. CT reassuring. Last normal was last night. Since she is having the other symptoms besides just facial droop will admit for observation and MRI.  I personally performed the services described in this documentation, which was scribed in my presence. The recorded  information has been reviewed and is accurate.     Juliet RudeNathan R. Rubin PayorPickering, MD 01/26/14 1115

## 2014-01-26 NOTE — ED Notes (Signed)
EMS reports pt has c/o headache x 2 weeks.  States went to bed last night at 10pm and felt normal.  Woke up this morning with left sided facial droop and weakness to left arm.  EMS reports grip strength equal but pt reports weakness.  CBG 108.

## 2014-01-27 ENCOUNTER — Observation Stay (HOSPITAL_COMMUNITY): Payer: BC Managed Care – PPO

## 2014-01-27 DIAGNOSIS — G510 Bell's palsy: Secondary | ICD-10-CM | POA: Diagnosis present

## 2014-01-27 LAB — GLUCOSE, CAPILLARY
GLUCOSE-CAPILLARY: 102 mg/dL — AB (ref 70–99)
GLUCOSE-CAPILLARY: 190 mg/dL — AB (ref 70–99)

## 2014-01-27 LAB — HEMOGLOBIN A1C
HEMOGLOBIN A1C: 6.8 % — AB (ref <5.7)
Mean Plasma Glucose: 148 mg/dL — ABNORMAL HIGH (ref <117)

## 2014-01-27 LAB — LIPID PANEL
CHOLESTEROL: 246 mg/dL — AB (ref 0–200)
HDL: 43 mg/dL (ref >39)
LDL Cholesterol: 179 mg/dL — ABNORMAL HIGH (ref 0–99)
TRIGLYCERIDES: 119 mg/dL (ref <150)
Total CHOL/HDL Ratio: 5.7 RATIO
VLDL: 24 mg/dL (ref 0–40)

## 2014-01-27 MED ORDER — PREDNISONE 10 MG PO TABS: ORAL_TABLET | ORAL | Status: DC

## 2014-01-27 MED ORDER — NAPHAZOLINE HCL 0.1 % OP SOLN
2.0000 [drp] | Freq: Four times a day (QID) | OPHTHALMIC | Status: DC | PRN
  Filled 2014-01-27: qty 15

## 2014-01-27 MED ORDER — OXYCODONE-ACETAMINOPHEN 5-325 MG PO TABS
1.0000 | ORAL_TABLET | ORAL | Status: DC | PRN
  Administered 2014-01-27: 1 via ORAL
  Filled 2014-01-27: qty 1

## 2014-01-27 MED ORDER — OXYCODONE-ACETAMINOPHEN 5-325 MG PO TABS: 1.0000 | ORAL_TABLET | ORAL | Status: DC | PRN

## 2014-01-27 MED ORDER — VALACYCLOVIR HCL 1 G PO TABS: 1000.0000 mg | ORAL_TABLET | Freq: Three times a day (TID) | ORAL | Status: DC

## 2014-01-27 MED ORDER — NAPHAZOLINE HCL 0.1 % OP SOLN: 2.0000 [drp] | Freq: Four times a day (QID) | OPHTHALMIC | Status: DC | PRN

## 2014-01-27 NOTE — Discharge Summary (Signed)
Physician Discharge Summary  Victoria Williams:[id number] DOB: Oct 14, 1966 DOA: 01/26/2014  PCP: Colette Ribas, MD  Admit date: 01/26/2014 Discharge date: 01/27/2014  Time spent: 40  minutes  Recommendations for Outpatient Follow-up:  1. Call Dr. Beatrice Lecher office for follow up appointment 1-2 weeks for evaluation of Bell's Palsy. Also recommend follow up on lipid panel.  2. Patch eye at night  Discharge Diagnoses:  Principal Problem:   Facial paralysis/Bells palsy Active Problems:   Facial droop   Facial weakness   Discharge Condition: stable  Diet recommendation: regular  Filed Weights   01/26/14 0735 01/26/14 1211  Weight: 106.595 kg (235 lb) 106.5 kg (234 lb 12.6 oz)    History of present illness:  48 year old woman presented to the emergency department on 01/26/14 with left-sided facial weakness.  Patient reported feeling nauseous the day prior.  The morning of 01/26/14, when she awoke, she noted left-sided facial weakness in the mirror. She had difficulty speaking because of tongue weakness. She felt numbness on the left side of her face. No other neurologic deficits, no arm or leg weakness. No difficulty ambulating. She reported having had a headache one week prior which resolved spontaneously.  In the emergency department afebrile, vital signs stable. Noted to have left-sided facial weakness. No hypoxia. CBC and basic metabolic panel were unremarkable. Glucose mildly elevated. Urinalysis, urine drug screen, serum alcohol levels unremarkable. CT of the head no acute abnormality. EKG showed normal sinus rhythm with no acute changes.  Hospital Course:  1. Right facial weakness/Bells Palsy. Exam and history highly suspicious for Bell's palsy. MRI of the brain negative for acute abnormality. Lipid panel with cholesterol 246 and LDL 179. Started prednisone and valacyclovir. Will discharge with prednisone 60 mg for 7 days and  valacyclovir to complete 7 day course. At discharge  continued with left sided facial weakness and inability to close left eye. Recommend drops during day and ointment with patch at night. Follow up with PCP 1-2 weeks 2. History of steroid-induced hyperglycemia. CBG 102-200. Related to steroids. Will be discharged with steroid taper. Anticipate resolution once off steroids.  3. H/o HSP.   Procedures:    Consultations:  none  Discharge Exam: Filed Vitals:   01/27/14 0453  BP: 111/71  Pulse: 68  Temp: 98.4 F (36.9 C)  Resp: 18    General: obese calm appears comfortable. NAD Cardiovascular: RRR No MGR no LE edema Respiratory: normal effort BS clear bilaterally. No wheeze Neuro: left sided facial droop including inability to close left eye, loss of nasolabial fold. UE strength 5/5. Speech clear   Discharge Instructions You were cared for by a hospitalist during your hospital stay. If you have any questions about your discharge medications or the care you received while you were in the hospital after you are discharged, you can call the unit and asked to speak with the hospitalist on call if the hospitalist that took care of you is not available. Once you are discharged, your primary care physician will handle any further medical issues. Please note that NO REFILLS for any discharge medications will be authorized once you are discharged, as it is imperative that you return to your primary care physician (or establish a relationship with a primary care physician if you do not have one) for your aftercare needs so that they can reassess your need for medications and monitor your lab values.     Medication List         acetaminophen 500 MG tablet  Commonly known as:  TYLENOL  Take 1,000 mg by mouth 2 (two) times daily as needed for headache.     ibuprofen 200 MG tablet  Commonly known as:  ADVIL,MOTRIN  Take 600 mg by mouth 2 (two) times daily as needed for moderate pain.     naphazoline 0.1 % ophthalmic solution  Commonly known  as:  NAPHCON  Place 2 drops into the left eye 4 (four) times daily as needed for irritation.     oxyCODONE-acetaminophen 5-325 MG per tablet  Commonly known as:  PERCOCET/ROXICET  Take 1-2 tablets by mouth every 4 (four) hours as needed for severe pain.     predniSONE 10 MG tablet  Commonly known as:  DELTASONE  Take 6 tabs for 5 days then stop.     valACYclovir 1000 MG tablet  Commonly known as:  VALTREX  Take 1 tablet (1,000 mg total) by mouth 3 (three) times daily.       Allergies  Allergen Reactions  . Septra [Bactrim] Nausea And Vomiting  . Sulfa Antibiotics Nausea And Vomiting  . Cephalexin Hives  . Ciprocin-Fluocin-Procin [Fluocinolone Acetonide] Hives  . Bee Venom Swelling    REACTION: Severe swelling  . Levofloxacin Hives  . Penicillins    Follow-up Information   Call Colette RibasGOLDING, JOHN CABOT, MD.   Specialty:  Family Medicine   Contact information:   [address] [id number] [place] [address] [phone]        The results of significant diagnostics from this hospitalization (including imaging, microbiology, ancillary and laboratory) are listed below for reference.    Significant Diagnostic Studies: Ct Head Wo Contrast  01/26/2014   CLINICAL DATA:  Left-sided weakness  EXAM: CT HEAD WITHOUT CONTRAST  TECHNIQUE: Contiguous axial images were obtained from the base of the skull through the vertex without intravenous contrast.  COMPARISON:  01/07/2012  FINDINGS: The bony calvarium is intact. The ventricles are of normal size and configuration. No findings to suggest acute hemorrhage, acute infarction or space-occupying mass lesion are noted.  IMPRESSION: No acute abnormality noted.   Electronically Signed   By: Alcide CleverMark  Lukens M.D.   On: 01/26/2014 08:31   Mr Brain Wo Contrast  01/27/2014   CLINICAL DATA:  Left-sided facial droop.  Left-sided weakness.  EXAM: MRI HEAD WITHOUT CONTRAST  TECHNIQUE: Multiplanar, multiecho pulse sequences of the brain and  surrounding structures were obtained without intravenous contrast.  COMPARISON:  CT HEAD W/O CM dated 01/26/2014  FINDINGS: The diffusion-weighted images demonstrate no evidence for acute or subacute infarction. No hemorrhage or mass lesion is present. Ventricles are of normal size. No significant extra-axial fluid collection is present.  No significant white matter disease is present. Flow is present in the major intracranial arteries. The globes and orbits are intact. The paranasal sinuses and mastoid air cells are clear.  IMPRESSION: Negative MRI of the brain.   Electronically Signed   By: Gennette Pachris  Mattern M.D.   On: 01/27/2014 09:04    Microbiology: No results found for this or any previous visit (from the past 240 hour(s)).   Labs: Basic Metabolic Panel:  Recent Labs Lab 01/26/14 0845 01/26/14 0924  NA 139 142  K 3.9 3.6*  CL 103 105  CO2 23  --   GLUCOSE 133* 127*  BUN 9 7  CREATININE 0.56 0.60  CALCIUM 8.8  --    Liver Function Tests:  Recent Labs Lab 01/26/14 0845  AST 13  ALT 10  ALKPHOS 84  BILITOT 0.3  PROT 7.1  ALBUMIN 3.3*   No results found for this basename: LIPASE, AMYLASE,  in the last 168 hours No results found for this basename: AMMONIA,  in the last 168 hours CBC:  Recent Labs Lab 01/26/14 0845 01/26/14 0924  WBC 8.1  --   NEUTROABS 5.1  --   HGB 13.4 14.6  HCT 40.7 43.0  MCV 86.8  --   PLT 325  --    Cardiac Enzymes: No results found for this basename: CKTOTAL, CKMB, CKMBINDEX, TROPONINI,  in the last 168 hours BNP: BNP (last 3 results) No results found for this basename: PROBNP,  in the last 8760 hours CBG:  Recent Labs Lab 01/26/14 1614 01/26/14 2128 01/27/14 0738  GLUCAP 139* 201* 102*       Signed:  BLACK,KAREN M  [facility] 01/27/2014, 11:38 AM

## 2014-01-27 NOTE — Discharge Instructions (Signed)
Bell's Palsy  Bell's palsy is a condition in which the muscles on one side of the face cannot move (paralysis). This is because the nerves in the face are paralyzed. It is most often thought to be caused by a virus. The virus causes swelling of the nerve that controls movement on one side of the face. The nerve travels through a tight space surrounded by bone. When the nerve swells, it can be compressed by the bone. This results in damage to the protective covering around the nerve. This damage interferes with how the nerve communicates with the muscles of the face. As a result, it can cause weakness or paralysis of the facial muscles.   Injury (trauma), tumor, and surgery may cause Bell's palsy, but most of the time the cause is unknown. It is a relatively common condition. It starts suddenly (abrupt onset) with the paralysis usually ending within 2 days. Bell's palsy is not dangerous. But because the eye does not close properly, you may need care to keep the eye from getting dry. This can include splinting (to keep the eye shut) or moistening with artificial tears. Bell's palsy very seldom occurs on both sides of the face at the same time.  SYMPTOMS    Eyebrow sagging.   Drooping of the eyelid and corner of the mouth.   Inability to close one eye.   Loss of taste on the front of the tongue.   Sensitivity to loud noises.  TREATMENT   The treatment is usually non-surgical. If the patient is seen within the first 24 to 48 hours, a short course of steroids may be prescribed, in an attempt to shorten the length of the condition. Antiviral medicines may also be used with the steroids, but it is unclear if they are helpful.   You will need to protect your eye, if you cannot close it. The cornea (clear covering over your eye) will become dry and can be damaged. Artificial tears can be used to keep your eye moist. Glasses or an eye patch should be worn to protect your eye.  PROGNOSIS   Recovery is variable, ranging  from days to months. Although the problem usually goes away completely (about 80% of cases resolve), predicting the outcome is impossible. Most people improve within 3 weeks of when the symptoms began. Improvement may continue for 3 to 6 months. A small number of people have moderate to severe weakness that is permanent.   HOME CARE INSTRUCTIONS    If your caregiver prescribed medication to reduce swelling in the nerve, use as directed. Do not stop taking the medication unless directed by your caregiver.   Use moisturizing eye drops as needed to prevent drying of your eye, as directed by your caregiver.   Protect your eye, as directed by your caregiver.   Use facial massage and exercises, as directed by your caregiver.   Perform your normal activities, and get your normal rest.  SEEK IMMEDIATE MEDICAL CARE IF:    There is pain, redness or irritation in the eye.   You or your child has an oral temperature above 102 F (38.9 C), not controlled by medicine.  MAKE SURE YOU:    Understand these instructions.   Will watch your condition.   Will get help right away if you are not doing well or get worse.  Document Released: 10/10/2005 Document Revised: 01/02/2012 Document Reviewed: 10/19/2009  Exit[employer] Patient Information 2014 [employer].

## 2014-01-27 NOTE — Discharge Summary (Signed)
Patient seen, independently examined and chart reviewed. I agree with exam, assessment and plan discussed with Toya SmothersKaren Black, NP.  Subjective: Feels about the same today. Does have a headache which has been intermittent for the last 2 weeks. Left facial weakness the same. No new neurologic symptoms. No upper or lower extremity weakness or numbness.  Objective: Afebrile, vital signs stable. Appears calm and comfortable. Psychiatric grossly normal. Cardiovascular regular rate and rhythm. No murmur, rub or gallop. No lower extremity edema. Respiratory clear to auscultation bilaterally. No wheezes, rales or rhonchi. Normal respiratory effort. Neurologic: Grossly normal tone and strength all extremities, symmetric. No pronator drift. No upper extremity dysdiadochokinesis. Left facial weakness without change, including entire face. Minimal movement of the brow with effort. Able to nearly close the eye. Loss of left nasolabial fold. Speech fluent and clear. The left ear appears unremarkable. External knee is unremarkable, canal appears unremarkable, tympanic membrane unremarkable. There is no evidence of rash, vesicles or skin lesions.  Capillary blood sugars stable. Total cholesterol, LDL elevated, reviewed with patient. She will followup with her primary care physician for these values. MRI of the brain negative.  History of clinical findings consistent with Bell's palsy with no signs or symptoms to suggest stroke. MRI of the brain negative. Discussed in detail with husband, mother and daughter at bedside. Discussed treatment plan with steroids and also at other. We discussed that spontaneous improvement often occurs at may not be complete. We also discussed monitoring the left face and ear for rash, currently no evidence of this. Finally we discussed proper eye care.  Plan discharge home today.  Brendia Sacksaniel Tahjanae Blankenburg, MD [facility] [phone]

## 2014-01-27 NOTE — Progress Notes (Signed)
01/27/14 1403 Reviewed discharge instructions with patient. Given copy of AVS, prescriptions, bells palsy education sheet. Reviewed s/s, when to call MD. Instructed to schedule follow-up appointment with Dr. Phillips [facility]orGolding. [facility] patients call to schedule rather than nursing staff. States will call. IV site d/c'd, within normal limits. No c/o pain at this time. Verbalized understanding of instructions, when to follow-up with MD. Pt in stable condition awaiting w/c for discharge. Earnstine RegalAshley Shondell Fabel, RN

## 2014-11-06 ENCOUNTER — Other Ambulatory Visit: Payer: Self-pay

## 2014-11-06 DIAGNOSIS — Z1231 Encounter for screening mammogram for malignant neoplasm of breast: Secondary | ICD-10-CM

## 2014-11-27 ENCOUNTER — Ambulatory Visit
Admission: RE | Admit: 2014-11-27 | Discharge: 2014-11-27 | Disposition: A | Discharge status: Home / Self Care | Payer: BLUE CROSS/BLUE SHIELD | Source: Ambulatory Visit

## 2014-11-27 DIAGNOSIS — Z1231 Encounter for screening mammogram for malignant neoplasm of breast: Secondary | ICD-10-CM

## 2015-04-13 ENCOUNTER — Encounter: Payer: Self-pay | Admitting: Cardiology

## 2015-04-30 ENCOUNTER — Ambulatory Visit (INDEPENDENT_AMBULATORY_CARE_PROVIDER_SITE_OTHER): Payer: BLUE CROSS/BLUE SHIELD | Admitting: Cardiology

## 2015-04-30 ENCOUNTER — Encounter: Payer: Self-pay | Admitting: *Deleted

## 2015-04-30 ENCOUNTER — Encounter: Payer: Self-pay | Admitting: Cardiology

## 2015-04-30 VITALS — BP 126/85 | HR 80 | Ht 63.0 in | Wt 228.0 lb

## 2015-04-30 DIAGNOSIS — R079 Chest pain, unspecified: Secondary | ICD-10-CM | POA: Diagnosis not present

## 2015-04-30 DIAGNOSIS — R0602 Shortness of breath: Secondary | ICD-10-CM | POA: Diagnosis not present

## 2015-04-30 NOTE — Progress Notes (Signed)
Clinical Summary Ms. Dierdre HarnessDucharme is a 49 y.o.female seen today as a new patient for the following medical problems,.  1. Chest pain - started over last few months. Can occur at rest or with exertion. Typically left chest pain, aching pain or sharp pain. 8/10 in severity. No SOB or palpitations. Not positional. Pain lasts up to 1 hour. Occurs 4 days week. Increase in frequency and severity over the last few weeks. No relation to food.  - notes some DOE at 1 block, though also limited after achilles surgery in 2013  CAD risk factors: HTN, DM2 on steroids mainly, father MI 1447. Several paternal uncles with MIs.       Past Medical History  Diagnosis Date  . Henoch-Schonlein purpura   . Hypertension   . IBS (irritable bowel syndrome)   . History of kidney stones   . Obesity   . Arthritis   . History of UTI   . Chronic kidney disease   . Esophagitis 2012    EGD  . Diabetes mellitus     only when on steriods     Allergies  Allergen Reactions  . Septra [Bactrim] Nausea And Vomiting  . Sulfa Antibiotics Nausea And Vomiting  . Cephalexin Hives  . Ciprocin-Fluocin-Procin [Fluocinolone Acetonide] Hives  . Bee Venom Swelling    REACTION: Severe swelling  . Levofloxacin Hives  . Penicillins      Current Outpatient Prescriptions  Medication Sig Dispense Refill  . acetaminophen (TYLENOL) 500 MG tablet Take 1,000 mg by mouth 2 (two) times daily as needed for headache.    . ibuprofen (ADVIL,MOTRIN) 200 MG tablet Take 600 mg by mouth 2 (two) times daily as needed for moderate pain.    . naphazoline (NAPHCON) 0.1 % ophthalmic solution Place 2 drops into the left eye 4 (four) times daily as needed for irritation. 15 mL 0  . oxyCODONE-acetaminophen (PERCOCET/ROXICET) 5-325 MG per tablet Take 1-2 tablets by mouth every 4 (four) hours as needed for severe pain. 20 tablet 0  . predniSONE (DELTASONE) 10 MG tablet Take 6 tabs for 5 days then stop. 30 tablet 0  . valACYclovir (VALTREX) 1000  MG tablet Take 1 tablet (1,000 mg total) by mouth 3 (three) times daily. 18 tablet 0   No current facility-administered medications for this visit.     Past Surgical History  Procedure Laterality Date  . Abdominal hysterectomy    . Tubal ligation    . Cesarean section    . Knee surgery    . Tonsilectomy, adenoidectomy, bilateral myringotomy and tubes    . Neck surgery      cervical fusion 6/7  . Tonsillectomy    . Achilles tendon surgery       Allergies  Allergen Reactions  . Septra [Bactrim] Nausea And Vomiting  . Sulfa Antibiotics Nausea And Vomiting  . Cephalexin Hives  . Ciprocin-Fluocin-Procin [Fluocinolone Acetonide] Hives  . Bee Venom Swelling    REACTION: Severe swelling  . Levofloxacin Hives  . Penicillins       Family History  Problem Relation Age of Onset  . Colon cancer Neg Hx   . Leukemia Mother   . Diabetes Mother   . Esophageal cancer Brother   . Diabetes Maternal Aunt   . Diabetes Maternal Uncle   . Prostate cancer Maternal Grandmother   . Diabetes Maternal Grandfather      Social History Ms. Crosley reports that she has never smoked. She has never used smokeless tobacco. Ms.  Pearse reports that she drinks about 0.6 oz of alcohol per week.   Review of Systems CONSTITUTIONAL: No weight loss, fever, chills, weakness or fatigue.  HEENT: Eyes: No visual loss, blurred vision, double vision or yellow sclerae.No hearing loss, sneezing, congestion, runny nose or sore throat.  SKIN: No rash or itching.  CARDIOVASCULAR: per HPI RESPIRATORY: No shortness of breath, cough or sputum.  GASTROINTESTINAL: No anorexia, nausea, vomiting or diarrhea. No abdominal pain or blood.  GENITOURINARY: No burning on urination, no polyuria NEUROLOGICAL: No headache, dizziness, syncope, paralysis, ataxia, numbness or tingling in the extremities. No change in bowel or bladder control.  MUSCULOSKELETAL: No muscle, back pain, joint pain or stiffness.  LYMPHATICS: No  enlarged nodes. No history of splenectomy.  PSYCHIATRIC: No history of depression or anxiety.  ENDOCRINOLOGIC: No reports of sweating, cold or heat intolerance. No polyuria or polydipsia.  [name]   Physical Examination Filed Vitals:   04/30/15 0834  BP: 126/85  Pulse: 80   Filed Vitals:   04/30/15 0834  Height:  (1.6 m)  Weight: 228 lb (103.42 kg)    Gen: resting comfortably, no acute distress HEENT: no scleral icterus, pupils equal round and reactive, no palptable cervical adenopathy,  CV: RRR, no m/r/g, nO JVD Resp: Clear to auscultation bilaterally GI: abdomen is soft, non-tender, non-distended, normal bowel sounds, no hepatosplenomegaly MSK: extremities are warm, no edema.  Skin: warm, no rash Neuro:  no focal deficits Psych: appropriate affect     Assessment and Plan  1. Chest pain - multiple CAD risk factors. Significant DOE has developed over the same time perioed - will obtain echo and GXT   F/u pending results      Antoine Poche, M.D

## 2015-04-30 NOTE — Patient Instructions (Signed)
Your physician recommends that you continue on your current medications as directed. Please refer to the Current Medication list given to you today. Your physician has requested that you have an echocardiogram. Echocardiography is a painless test that uses sound waves to create images of your heart. It provides your doctor with information about the size and shape of your heart and how well your heart's chambers and valves are working. This procedure takes approximately one hour. There are no restrictions for this procedure. Your physician has requested that you have an exercise tolerance test. For further information please visit [email or web address]. Please also follow instruction sheet, as given. Your physician recommends that you schedule a follow-up appointment in: 4 weeks.

## 2015-05-08 ENCOUNTER — Ambulatory Visit (HOSPITAL_BASED_OUTPATIENT_CLINIC_OR_DEPARTMENT_OTHER)
Admission: RE | Admit: 2015-05-08 | Discharge: 2015-05-08 | Disposition: A | Discharge status: Home / Self Care | Payer: BLUE CROSS/BLUE SHIELD | Source: Ambulatory Visit | Attending: Cardiology | Admitting: Cardiology

## 2015-05-08 ENCOUNTER — Other Ambulatory Visit (HOSPITAL_COMMUNITY): Payer: BLUE CROSS/BLUE SHIELD

## 2015-05-08 ENCOUNTER — Ambulatory Visit (HOSPITAL_COMMUNITY)
Admission: RE | Admit: 2015-05-08 | Discharge: 2015-05-08 | Disposition: A | Discharge status: Home / Self Care | Payer: BLUE CROSS/BLUE SHIELD | Source: Ambulatory Visit | Attending: Cardiology | Admitting: Cardiology

## 2015-05-08 DIAGNOSIS — R079 Chest pain, unspecified: Secondary | ICD-10-CM

## 2015-05-08 DIAGNOSIS — R0602 Shortness of breath: Secondary | ICD-10-CM

## 2015-05-08 DIAGNOSIS — I371 Nonrheumatic pulmonary valve insufficiency: Secondary | ICD-10-CM | POA: Diagnosis not present

## 2015-05-08 DIAGNOSIS — R0600 Dyspnea, unspecified: Secondary | ICD-10-CM | POA: Insufficient documentation

## 2015-05-08 LAB — EXERCISE TOLERANCE TEST
CHL CUP MPHR: 171 {beats}/min
CSEPED: 7 min
CSEPEDS: 8 s
CSEPHR: 93 %
CSEPPHR: 160 {beats}/min
Estimated workload: 10 METS
RPE: 15
Rest HR: 83 {beats}/min

## 2015-05-11 ENCOUNTER — Telehealth: Payer: Self-pay

## 2015-05-11 NOTE — Telephone Encounter (Signed)
LM on pt's private VM Dr.Branch's result notes

## 2015-05-11 NOTE — Telephone Encounter (Signed)
-----   Message from Antoine PocheJonathan F Branch, MD sent at 05/11/2015 12:48 PM EDT ----- Stress test is normal, no evidence of any blockages. Combined with her echo her heart looks to be in good shape. She should f/u with her pcp to evaluate for noncardiac causes of chest pain. May f/u with us as needed  Dominga FerryJ Branch MD

## 2015-05-11 NOTE — Telephone Encounter (Signed)
Spoke with pt and given results of both tets

## 2015-05-11 NOTE — Telephone Encounter (Signed)
-----   Message from Antoine PocheJonathan F Branch, MD sent at 05/11/2015 12:47 PM EDT ----- Echo looks good, normal heart function  Dominga FerryJ Branch MD

## 2015-05-18 ENCOUNTER — Telehealth: Payer: Self-pay | Admitting: *Deleted

## 2015-05-18 NOTE — Telephone Encounter (Signed)
1 yr recall placed, pt aware

## 2015-05-18 NOTE — Telephone Encounter (Signed)
-----   Message from Antoine Poche, MD sent at 05/18/2015 10:32 AM EDT ----- Can follow up in 1 year  Dominga Ferry MD ----- Message -----    From: Albertine Patricia, CMA    Sent: 05/14/2015  10:43 AM      To: Antoine Poche, MD  Do you still want this pt to f/u with you in August? Per test results says f/u as needed.  Bronco Mcgrory

## 2015-05-26 ENCOUNTER — Emergency Department (HOSPITAL_COMMUNITY)
Admission: EM | Admit: 2015-05-26 | Discharge: 2015-05-26 | Disposition: A | Discharge status: Home / Self Care | Payer: BLUE CROSS/BLUE SHIELD | Attending: Emergency Medicine | Admitting: Emergency Medicine

## 2015-05-26 ENCOUNTER — Emergency Department (HOSPITAL_COMMUNITY): Payer: BLUE CROSS/BLUE SHIELD

## 2015-05-26 ENCOUNTER — Encounter (HOSPITAL_COMMUNITY): Payer: Self-pay | Admitting: Emergency Medicine

## 2015-05-26 DIAGNOSIS — I129 Hypertensive chronic kidney disease with stage 1 through stage 4 chronic kidney disease, or unspecified chronic kidney disease: Secondary | ICD-10-CM | POA: Insufficient documentation

## 2015-05-26 DIAGNOSIS — Z87442 Personal history of urinary calculi: Secondary | ICD-10-CM | POA: Diagnosis not present

## 2015-05-26 DIAGNOSIS — R1011 Right upper quadrant pain: Secondary | ICD-10-CM | POA: Diagnosis not present

## 2015-05-26 DIAGNOSIS — Z880 Allergy status to penicillin: Secondary | ICD-10-CM | POA: Diagnosis not present

## 2015-05-26 DIAGNOSIS — R110 Nausea: Secondary | ICD-10-CM | POA: Diagnosis not present

## 2015-05-26 DIAGNOSIS — N189 Chronic kidney disease, unspecified: Secondary | ICD-10-CM | POA: Diagnosis not present

## 2015-05-26 DIAGNOSIS — Z90710 Acquired absence of both cervix and uterus: Secondary | ICD-10-CM | POA: Diagnosis not present

## 2015-05-26 DIAGNOSIS — Z862 Personal history of diseases of the blood and blood-forming organs and certain disorders involving the immune mechanism: Secondary | ICD-10-CM | POA: Diagnosis not present

## 2015-05-26 DIAGNOSIS — M1990 Unspecified osteoarthritis, unspecified site: Secondary | ICD-10-CM | POA: Insufficient documentation

## 2015-05-26 DIAGNOSIS — Z79899 Other long term (current) drug therapy: Secondary | ICD-10-CM | POA: Insufficient documentation

## 2015-05-26 DIAGNOSIS — E119 Type 2 diabetes mellitus without complications: Secondary | ICD-10-CM | POA: Insufficient documentation

## 2015-05-26 DIAGNOSIS — Z8719 Personal history of other diseases of the digestive system: Secondary | ICD-10-CM | POA: Diagnosis not present

## 2015-05-26 DIAGNOSIS — E669 Obesity, unspecified: Secondary | ICD-10-CM | POA: Diagnosis not present

## 2015-05-26 DIAGNOSIS — Z87440 Personal history of urinary (tract) infections: Secondary | ICD-10-CM | POA: Insufficient documentation

## 2015-05-26 DIAGNOSIS — R1013 Epigastric pain: Secondary | ICD-10-CM | POA: Insufficient documentation

## 2015-05-26 LAB — COMPREHENSIVE METABOLIC PANEL
ALBUMIN: 3.7 g/dL (ref 3.5–5.0)
ALT: 15 U/L (ref 14–54)
AST: 17 U/L (ref 15–41)
Alkaline Phosphatase: 74 U/L (ref 38–126)
Anion gap: 10 (ref 5–15)
BILIRUBIN TOTAL: 0.2 mg/dL — AB (ref 0.3–1.2)
BUN: 12 mg/dL (ref 6–20)
CO2: 23 mmol/L (ref 22–32)
CREATININE: 0.57 mg/dL (ref 0.44–1.00)
Calcium: 9.2 mg/dL (ref 8.9–10.3)
Chloride: 105 mmol/L (ref 101–111)
GFR calc Af Amer: >60 mL/min (ref >60)
GFR calc non Af Amer: >60 mL/min (ref >60)
GLUCOSE: 161 mg/dL — AB (ref 65–99)
Potassium: 4 mmol/L (ref 3.5–5.1)
SODIUM: 138 mmol/L (ref 135–145)
Total Protein: 7.2 g/dL (ref 6.5–8.1)

## 2015-05-26 LAB — CBC WITH DIFFERENTIAL/PLATELET
Basophils Absolute: 0 10*3/uL (ref 0.0–0.1)
Basophils Relative: 0 % (ref 0–1)
EOS ABS: 0.2 10*3/uL (ref 0.0–0.7)
Eosinophils Relative: 2 % (ref 0–5)
HCT: 42.2 % (ref 36.0–46.0)
Hemoglobin: 13.9 g/dL (ref 12.0–15.0)
Lymphocytes Relative: 34 % (ref 12–46)
Lymphs Abs: 3 10*3/uL (ref 0.7–4.0)
MCH: 28.9 pg (ref 26.0–34.0)
MCHC: 32.9 g/dL (ref 30.0–36.0)
MCV: 87.7 fL (ref 78.0–100.0)
MONO ABS: 0.7 10*3/uL (ref 0.1–1.0)
MONOS PCT: 7 % (ref 3–12)
NEUTROS PCT: 57 % (ref 43–77)
Neutro Abs: 5 10*3/uL (ref 1.7–7.7)
PLATELETS: 304 10*3/uL (ref 150–400)
RBC: 4.81 MIL/uL (ref 3.87–5.11)
RDW: 13.1 % (ref 11.5–15.5)
WBC: 8.9 10*3/uL (ref 4.0–10.5)

## 2015-05-26 LAB — URINALYSIS, ROUTINE W REFLEX MICROSCOPIC
BILIRUBIN URINE: NEGATIVE
Glucose, UA: NEGATIVE mg/dL
Ketones, ur: NEGATIVE mg/dL
LEUKOCYTES UA: NEGATIVE
NITRITE: NEGATIVE
PROTEIN: NEGATIVE mg/dL
SPECIFIC GRAVITY, URINE: 1.02 (ref 1.005–1.030)
Urobilinogen, UA: 0.2 mg/dL (ref 0.0–1.0)
pH: 6.5 (ref 5.0–8.0)

## 2015-05-26 LAB — URINE MICROSCOPIC-ADD ON

## 2015-05-26 LAB — LIPASE, BLOOD: LIPASE: 21 U/L — AB (ref 22–51)

## 2015-05-26 MED ORDER — MORPHINE SULFATE 4 MG/ML IJ SOLN
4.0000 mg | Freq: Once | INTRAMUSCULAR | Status: AC
  Administered 2015-05-26: 4 mg via INTRAVENOUS
  Filled 2015-05-26: qty 1

## 2015-05-26 MED ORDER — SODIUM CHLORIDE 0.9 % IV BOLUS (SEPSIS)
500.0000 mL | Freq: Once | INTRAVENOUS | Status: AC
  Administered 2015-05-26: 500 mL via INTRAVENOUS

## 2015-05-26 MED ORDER — GI COCKTAIL ~~LOC~~
30.0000 mL | Freq: Once | ORAL | Status: AC
  Administered 2015-05-26: 30 mL via ORAL
  Filled 2015-05-26: qty 30

## 2015-05-26 NOTE — ED Notes (Signed)
Pt c/o epigastric pain that radiates to back, she states it is a burning pain since 2130

## 2015-05-26 NOTE — ED Provider Notes (Signed)
CSN: [id number]     Arrival date & time 05/26/15  4098 History   First MD Initiated Contact with Patient 05/26/15 0534     Chief Complaint  Patient presents with  . Abdominal Pain     (Consider location/radiation/quality/duration/timing/severity/associated sxs/prior Treatment) Patient is a 49 y.o. female presenting with abdominal pain. The history is provided by the patient.  Abdominal Pain Associated symptoms: nausea   Associated symptoms: no chest pain, no constipation, no diarrhea, no dysuria, no shortness of breath and no vomiting    patient presents with epigastric pain that radiates to the right flank. Some nausea. No fevers. No diarrhea. It is dull. Has a history of esophagitis. Also a history of HSP. Has had issues with her abdomen also after sterilized. Recently had Bell's palsy and still has some facial deficits. States it is improved. The Bell's palsy began around 16 weeks ago.  Past Medical History  Diagnosis Date  . Henoch-Schonlein purpura   . Hypertension   . IBS (irritable bowel syndrome)   . History of kidney stones   . Obesity   . Arthritis   . History of UTI   . Chronic kidney disease   . Esophagitis 2012    EGD  . Diabetes mellitus     only when on steriods   Past Surgical History  Procedure Laterality Date  . Abdominal hysterectomy    . Tubal ligation    . Cesarean section    . Knee surgery    . Tonsilectomy, adenoidectomy, bilateral myringotomy and tubes    . Neck surgery      cervical fusion 6/7  . Tonsillectomy    . Achilles tendon surgery     Family History  Problem Relation Age of Onset  . Colon cancer Neg Hx   . Leukemia Mother   . Diabetes Mother   . Esophageal cancer Brother   . Diabetes Maternal Aunt   . Diabetes Maternal Uncle   . Prostate cancer Maternal Grandmother   . Diabetes Maternal Grandfather    History  Substance Use Topics  . Smoking status: Never Smoker   . Smokeless tobacco: Never Used  . Alcohol Use: 0.6 oz/week     1 Glasses of wine per week     Comment: 2 a month    OB History    No data available     Review of Systems  Constitutional: Negative for appetite change.  Respiratory: Negative for shortness of breath.   Cardiovascular: Negative for chest pain.  Gastrointestinal: Positive for nausea and abdominal pain. Negative for vomiting, diarrhea and constipation.  Genitourinary: Positive for flank pain. Negative for dysuria.  Musculoskeletal: Negative for back pain and gait problem.  Skin: Negative for pallor.  Neurological: Negative for headaches.  Hematological: Negative for adenopathy.  Psychiatric/Behavioral: Negative for confusion.      Allergies  Septra; Sulfa antibiotics; Cephalexin; Ciprocin-fluocin-procin; Bee venom; Levofloxacin; and Penicillins  Home Medications   Prior to Admission medications   Medication Sig Start Date End Date Taking? Authorizing Provider  acetaminophen (TYLENOL) 500 MG tablet Take 1,000 mg by mouth 2 (two) times daily as needed for headache.   Yes Historical Provider, MD  calcium carbonate (TUMS - DOSED IN MG ELEMENTAL CALCIUM) 500 MG chewable tablet Chew 1 tablet by mouth daily.   Yes Historical Provider, MD  ibuprofen (ADVIL,MOTRIN) 200 MG tablet Take 600 mg by mouth 2 (two) times daily as needed for moderate pain.   Yes Historical Provider, MD  polyethylene glycol (MIRALAX /  GLYCOLAX) packet Take 17 g by mouth daily.   Yes Historical Provider, MD   BP 119/64 mmHg  Pulse 66  Temp(Src) 98.1 F (36.7 C) (Oral)  Resp 18  Ht  (1.626 m)  Wt 234 lb (106.142 kg)  BMI 40.15 kg/m2  SpO2 98% Physical Exam  Constitutional: She appears well-developed.  HENT:  Head: Normocephalic and atraumatic.  Eyes: Pupils are equal, round, and reactive to light.  Cardiovascular: Normal rate and regular rhythm.   Pulmonary/Chest: Effort normal.  Abdominal: Soft. There is tenderness.  Epigastric and right upper quadrant tenderness without rebound or guarding.   Neurological: She is alert.  Skin: Skin is warm.    ED Course  Procedures (including critical care time) Labs Review Labs Reviewed  COMPREHENSIVE METABOLIC PANEL - Abnormal; Notable for the following:    Glucose, Bld 161 (*)    Total Bilirubin 0.2 (*)    All other components within normal limits  LIPASE, BLOOD - Abnormal; Notable for the following:    Lipase 21 (*)    All other components within normal limits  URINALYSIS, ROUTINE W REFLEX MICROSCOPIC (NOT AT [facility]) - Abnormal; Notable for the following:    Hgb urine dipstick TRACE (*)    All other components within normal limits  CBC WITH DIFFERENTIAL/PLATELET  URINE MICROSCOPIC-ADD ON    Imaging Review US Abdomen Complete  05/26/2015   CLINICAL DATA:  Right upper quadrant pain  EXAM: ULTRASOUND ABDOMEN COMPLETE  COMPARISON:  CT scan 04/04/2012  FINDINGS: Gallbladder: No gallstones or wall thickening visualized. No sonographic Murphy sign noted.  Common bile duct: Diameter: 3.6 mm within normal limits  Liver: There is diffuse increased echogenicity of the liver suspicious for fatty infiltration. No focal hepatic mass.  IVC: No abnormality visualized.  Pancreas: Visualized portion unremarkable.  Spleen: Size and appearance within normal limits. Measures 9.9 cm in length  Right Kidney: Length: 12.7 cm. There is normal echogenicity. No hydronephrosis. There is duplicated collecting system.  Left Kidney: Length: 14.3 cm. Echogenicity within normal limits. No mass or hydronephrosis visualized.  Abdominal aorta: No aneurysm visualized. Measures up to 2.7 cm in diameter.  Other findings: None.  IMPRESSION: 1. No gallstones are noted within gallbladder.  Normal CBD. 2. Question fatty infiltration of the liver. 3. No hydronephrosis.  Duplicated right renal collecting system. 4. No aortic aneurysm.   Electronically Signed   By: Natasha Mead M.D.   On: 05/26/2015 09:10     EKG Interpretation None      MDM   Final diagnoses:  Epigastric  abdominal pain    Patient with abdominal pain. Upper abdominal tenderness. Lab work and ultrasound reassuring. Will discharge home.    Benjiman Core, MD 05/27/15 [phone]

## 2015-05-26 NOTE — ED Provider Notes (Signed)
Patient care signed out to follow-up blood work and reassess. Patient still having right upper quadrant pain radiating to the right shoulder and back. Patient does have esophagitis history however with persistent mild right upper quadrant pain plan for abdominal ultrasound for further delineation. Blood work reviewed unremarkable, patient nontoxic afebrile no guarding no peritonitis.   US Abdomen Complete  05/26/2015   CLINICAL DATA:  Right upper quadrant pain  EXAM: ULTRASOUND ABDOMEN COMPLETE  COMPARISON:  CT scan 04/04/2012  FINDINGS: Gallbladder: No gallstones or wall thickening visualized. No sonographic Murphy sign noted.  Common bile duct: Diameter: 3.6 mm within normal limits  Liver: There is diffuse increased echogenicity of the liver suspicious for fatty infiltration. No focal hepatic mass.  IVC: No abnormality visualized.  Pancreas: Visualized portion unremarkable.  Spleen: Size and appearance within normal limits. Measures 9.9 cm in length  Right Kidney: Length: 12.7 cm. There is normal echogenicity. No hydronephrosis. There is duplicated collecting system.  Left Kidney: Length: 14.3 cm. Echogenicity within normal limits. No mass or hydronephrosis visualized.  Abdominal aorta: No aneurysm visualized. Measures up to 2.7 cm in diameter.  Other findings: None.  IMPRESSION: 1. No gallstones are noted within gallbladder.  Normal CBD. 2. Question fatty infiltration of the liver. 3. No hydronephrosis.  Duplicated right renal collecting system. 4. No aortic aneurysm.   Electronically Signed   By: Natasha Mead M.D.   On: 05/26/2015 09:10    Epigastric pain  Blane Ohara, MD 05/26/15 [phone]

## 2015-05-26 NOTE — Discharge Instructions (Signed)
If you were given medicines take as directed.  If you are on coumadin or contraceptives realize their levels and effectiveness is altered by many different medicines.  If you have any reaction (rash, tongues swelling, other) to the medicines stop taking and see a physician.    If your blood pressure was elevated in the ER make sure you follow up for management with a primary doctor or return for chest pain, shortness of breath or stroke symptoms.  Please follow up as directed and return to the ER or see a physician for new or worsening symptoms.  Thank you. Filed Vitals:   05/26/15 0530 05/26/15 0926  BP: 153/91 134/70  Pulse: 76 62  Temp: 97.5 F (36.4 C)   Resp: 20 18  Height:  (1.626 m)   Weight: 234 lb (106.142 kg)   SpO2: 98% 97%

## 2015-05-28 ENCOUNTER — Ambulatory Visit: Payer: BLUE CROSS/BLUE SHIELD | Admitting: Cardiology

## 2015-07-02 ENCOUNTER — Ambulatory Visit: Payer: BLUE CROSS/BLUE SHIELD | Admitting: Gastroenterology

## 2016-01-19 ENCOUNTER — Other Ambulatory Visit: Payer: Self-pay

## 2016-01-19 DIAGNOSIS — Z1231 Encounter for screening mammogram for malignant neoplasm of breast: Secondary | ICD-10-CM

## 2016-02-13 ENCOUNTER — Encounter (HOSPITAL_COMMUNITY): Payer: Self-pay | Admitting: *Deleted

## 2016-02-13 ENCOUNTER — Emergency Department (HOSPITAL_COMMUNITY)
Admission: EM | Admit: 2016-02-13 | Discharge: 2016-02-13 | Disposition: A | Discharge status: Home / Self Care | Payer: BLUE CROSS/BLUE SHIELD | Attending: Emergency Medicine | Admitting: Emergency Medicine

## 2016-02-13 DIAGNOSIS — I129 Hypertensive chronic kidney disease with stage 1 through stage 4 chronic kidney disease, or unspecified chronic kidney disease: Secondary | ICD-10-CM | POA: Diagnosis not present

## 2016-02-13 DIAGNOSIS — Z79899 Other long term (current) drug therapy: Secondary | ICD-10-CM | POA: Diagnosis not present

## 2016-02-13 DIAGNOSIS — B029 Zoster without complications: Secondary | ICD-10-CM | POA: Diagnosis not present

## 2016-02-13 DIAGNOSIS — N189 Chronic kidney disease, unspecified: Secondary | ICD-10-CM | POA: Diagnosis not present

## 2016-02-13 DIAGNOSIS — E669 Obesity, unspecified: Secondary | ICD-10-CM | POA: Insufficient documentation

## 2016-02-13 DIAGNOSIS — E1122 Type 2 diabetes mellitus with diabetic chronic kidney disease: Secondary | ICD-10-CM | POA: Diagnosis not present

## 2016-02-13 MED ORDER — OXYCODONE-ACETAMINOPHEN 5-325 MG PO TABS
1.0000 | ORAL_TABLET | Freq: Once | ORAL | Status: AC
  Administered 2016-02-13: 1 via ORAL
  Filled 2016-02-13: qty 1

## 2016-02-13 MED ORDER — VALACYCLOVIR HCL 500 MG PO TABS
1000.0000 mg | ORAL_TABLET | Freq: Once | ORAL | Status: AC
  Administered 2016-02-13: 1000 mg via ORAL
  Filled 2016-02-13: qty 2

## 2016-02-13 MED ORDER — OXYCODONE-ACETAMINOPHEN 5-325 MG PO TABS: 1.0000 | ORAL_TABLET | Freq: Four times a day (QID) | ORAL | Status: DC | PRN

## 2016-02-13 MED ORDER — VALACYCLOVIR HCL 1 G PO TABS: 1000.0000 mg | ORAL_TABLET | Freq: Three times a day (TID) | ORAL | Status: DC

## 2016-02-13 NOTE — ED Notes (Signed)
Pt noticed bumps to her forehead last night. states he has had shingles before & this feels like the start on them again.

## 2016-02-13 NOTE — ED Provider Notes (Signed)
CSN: [id number]     Arrival date & time 02/13/16  4098 History   First MD Initiated Contact with Patient 02/13/16 [phone]     Chief Complaint  Patient presents with  . Herpes Zoster     (Consider location/radiation/quality/duration/timing/severity/associated sxs/prior Treatment) HPI  This is a 50 year old female with history of IBS, hypertension, obesity, Bell's palsy, herpes zoster who presents with bumps on her forehead. Patient reports one-week history of right-sided headache. She states that he has been mostly in her right for head and that her scalp is felt tingly. This morning she noted several bumps over the right side of her for head. She denies any vision changes. At baseline she has Bell's palsy which affects the left side of her face. This is chronic. Denies any other neurologic deficits. Currently her pain is 5 out of 10. She's not taken anything for pain. She is previously had herpes zoster twice.  Past Medical History  Diagnosis Date  . Henoch-Schonlein purpura (HCC)   . Hypertension   . IBS (irritable bowel syndrome)   . History of kidney stones   . Obesity   . Arthritis   . History of UTI   . Chronic kidney disease   . Esophagitis 2012    EGD  . Diabetes mellitus     only when on steriods   Past Surgical History  Procedure Laterality Date  . Abdominal hysterectomy    . Tubal ligation    . Cesarean section    . Knee surgery    . Tonsilectomy, adenoidectomy, bilateral myringotomy and tubes    . Neck surgery      cervical fusion 6/7  . Tonsillectomy    . Achilles tendon surgery     Family History  Problem Relation Age of Onset  . Colon cancer Neg Hx   . Leukemia Mother   . Diabetes Mother   . Esophageal cancer Brother   . Diabetes Maternal Aunt   . Diabetes Maternal Uncle   . Prostate cancer Maternal Grandmother   . Diabetes Maternal Grandfather    Social History  Substance Use Topics  . Smoking status: Never Smoker   . Smokeless tobacco: Never Used   . Alcohol Use: 0.6 oz/week    1 Glasses of wine per week     Comment: 2 a month    OB History    No data available     Review of Systems  Constitutional: Negative for fever.  Eyes: Negative for pain, discharge, redness and visual disturbance.  Respiratory: Negative for shortness of breath.   Cardiovascular: Negative for chest pain.  Skin: Positive for rash.  Neurological: Positive for headaches. Negative for weakness.  All other systems reviewed and are negative.     Allergies  Septra; Sulfa antibiotics; Cephalexin; Ciprocin-fluocin-procin; Bee venom; Levofloxacin; and Penicillins  Home Medications   Prior to Admission medications   Medication Sig Start Date End Date Taking? Authorizing Provider  acetaminophen (TYLENOL) 500 MG tablet Take 1,000 mg by mouth 2 (two) times daily as needed for headache.   Yes Historical Provider, MD  calcium carbonate (TUMS - DOSED IN MG ELEMENTAL CALCIUM) 500 MG chewable tablet Chew 1 tablet by mouth daily.   Yes Historical Provider, MD  ibuprofen (ADVIL,MOTRIN) 200 MG tablet Take 600 mg by mouth 2 (two) times daily as needed for moderate pain.   Yes Historical Provider, MD  omeprazole (PRILOSEC) 10 MG capsule Take 10 mg by mouth daily.   Yes Historical Provider, MD  polyethylene  glycol (MIRALAX / GLYCOLAX) packet Take 17 g by mouth daily.   Yes Historical Provider, MD  oxyCODONE-acetaminophen (PERCOCET/ROXICET) 5-325 MG tablet Take 1-2 tablets by mouth every 6 (six) hours as needed for severe pain. 02/13/16   Shon Batonourtney F Heavenleigh Petruzzi, MD  valACYclovir (VALTREX) 1000 MG tablet Take 1 tablet (1,000 mg total) by mouth 3 (three) times daily. 02/13/16   Shon Batonourtney F Laurent Cargile, MD   BP 140/76 mmHg  Pulse 73  Temp(Src) 98.4 F (36.9 C) (Oral)  Resp 16  Ht 5\' 3"  (1.6 m)  Wt 225 lb (102.059 kg)  BMI 39.87 kg/m2  SpO2 97% Physical Exam  Constitutional: She is oriented to person, place, and time. She appears well-developed and well-nourished.  Overweight  HENT:   Head: Normocephalic and atraumatic.  Mouth/Throat: Oropharynx is clear and moist.  Eyes: Conjunctivae and EOM are normal. Pupils are equal, round, and reactive to light.  Cardiovascular: Normal rate and regular rhythm.   Pulmonary/Chest: Effort normal. No respiratory distress.  Neurological: She is alert and oriented to person, place, and time.  Bell's palsy affecting the left side of the face  Skin: Skin is warm and dry.  Multiple punctate lesions just right of midline over the forehead, one vesicular lesion noted, patient also has 2 other lesions within the scalp of the right side of her head  Psychiatric: She has a normal mood and affect.  Nursing note and vitals reviewed.   ED Course  Procedures (including critical care time) Labs Review Labs Reviewed - No data to display  Imaging Review No results found. I have personally reviewed and evaluated these images and lab results as part of my medical decision-making.   EKG Interpretation None      MDM   Final diagnoses:  Herpes zoster    Patient presents with evidence of herpes zoster in the V1 distribution. No evidence of eye involvement at this time.  Patient given Percocet and valacyclovir. Discussed the patient return precautions. She will need a formal eye exam. She does have an eye doctor and will follow up with them as soon as possible. If she has any vision changes or notes any involvement about that eye, she is encouraged to be reevaluated immediately.  After history, exam, and medical workup I feel the patient has been appropriately medically screened and is safe for discharge home. Pertinent diagnoses were discussed with the patient. Patient was given return precautions.    Shon Batonourtney F Terique Kawabata, MD 02/13/16 [phone]

## 2016-02-13 NOTE — Discharge Instructions (Signed)
You have shingles. It is in a distribution that could affect her eye. You have no changes right now that suggest your eyes affected. He'll be given an antiviral. He also be given pain medication. Follow-up with your optometrist for formal eye evaluation. See return precautions below.  Shingles Shingles, which is also known as herpes zoster, is an infection that causes a painful skin rash and fluid-filled blisters. Shingles is not related to genital herpes, which is a sexually transmitted infection.   Shingles only develops in people who:  Have had chickenpox.  Have received the chickenpox vaccine. (This is rare.) CAUSES Shingles is caused by varicella-zoster virus (VZV). This is the same virus that causes chickenpox. After exposure to VZV, the virus stays in the body in an inactive (dormant) state. Shingles develops if the virus reactivates. This can happen many years after the initial exposure to VZV. It is not known what causes this virus to reactivate. RISK FACTORS People who have had chickenpox or received the chickenpox vaccine are at risk for shingles. Infection is more common in people who:  Are older than age 50.  Have a weakened defense (immune) system, such as those with HIV, AIDS, or cancer.  Are taking medicines that weaken the immune system, such as transplant medicines.  Are under great stress. SYMPTOMS Early symptoms of this condition include itching, tingling, and pain in an area on your skin. Pain may be described as burning, stabbing, or throbbing. A few days or weeks after symptoms start, a painful red rash appears, usually on one side of the body in a bandlike or beltlike pattern. The rash eventually turns into fluid-filled blisters that break open, scab over, and dry up in about 2-3 weeks. At any time during the infection, you may also develop:  A fever.  Chills.  A headache.  An upset stomach. DIAGNOSIS This condition is diagnosed with a skin exam. Sometimes,  skin or fluid samples are taken from the blisters before a diagnosis is made. These samples are examined under a microscope or sent to a lab for testing. TREATMENT There is no specific cure for this condition. Your health care provider will probably prescribe medicines to help you manage pain, recover more quickly, and avoid long-term problems. Medicines may include:  Antiviral drugs.  Anti-inflammatory drugs.  Pain medicines. If the area involved is on your face, you may be referred to a specialist, such as an eye doctor (ophthalmologist) or an ear, nose, and throat (ENT) doctor to help you avoid eye problems, chronic pain, or disability. HOME CARE INSTRUCTIONS Medicines  Take medicines only as directed by your health care provider.  Apply an anti-itch or numbing cream to the affected area as directed by your health care provider. Blister and Rash Care  Take a cool bath or apply cool compresses to the area of the rash or blisters as directed by your health care provider. This may help with pain and itching.  Keep your rash covered with a loose bandage (dressing). Wear loose-fitting clothing to help ease the pain of material rubbing against the rash.  Keep your rash and blisters clean with mild soap and cool water or as directed by your health care provider.  Check your rash every day for signs of infection. These include redness, swelling, and pain that lasts or increases.  Do not pick your blisters.  Do not scratch your rash. General Instructions  Rest as directed by your health care provider.  Keep all follow-up visits as directed by  your health care provider. This is important.  Until your blisters scab over, your infection can cause chickenpox in people who have never had it or been vaccinated against it. To prevent this from happening, avoid contact with other people, especially:  Babies.  Pregnant women.  Children who have eczema.  Elderly people who have  transplants.  People who have chronic illnesses, such as leukemia or AIDS. SEEK MEDICAL CARE IF:  Your pain is not relieved with prescribed medicines.  Your pain does not get better after the rash heals.  Your rash looks infected. Signs of infection include redness, swelling, and pain that lasts or increases. SEEK IMMEDIATE MEDICAL CARE IF:  The rash is on your face or nose.  You have facial pain, pain around your eye area, or loss of feeling on one side of your face.  You have ear pain or you have ringing in your ear.  You have loss of taste.  Your condition gets worse.   This information is not intended to replace advice given to you by your health care provider. Make sure you discuss any questions you have with your health care provider.   Document Released: 10/10/2005 Document Revised: 10/31/2014 Document Reviewed: 08/21/2014 Elsevier Interactive Patient Education [employer]

## 2016-02-15 ENCOUNTER — Ambulatory Visit: Payer: Self-pay

## 2016-02-15 DIAGNOSIS — H04123 Dry eye syndrome of bilateral lacrimal glands: Secondary | ICD-10-CM | POA: Diagnosis not present

## 2016-02-16 DIAGNOSIS — Z1389 Encounter for screening for other disorder: Secondary | ICD-10-CM | POA: Diagnosis not present

## 2016-02-16 DIAGNOSIS — B029 Zoster without complications: Secondary | ICD-10-CM | POA: Diagnosis not present

## 2016-02-16 DIAGNOSIS — Z6841 Body Mass Index (BMI) 40.0 and over, adult: Secondary | ICD-10-CM | POA: Diagnosis not present

## 2016-03-31 ENCOUNTER — Ambulatory Visit
Admission: RE | Admit: 2016-03-31 | Discharge: 2016-03-31 | Disposition: A | Discharge status: Home / Self Care | Payer: BLUE CROSS/BLUE SHIELD | Source: Ambulatory Visit

## 2016-03-31 DIAGNOSIS — Z1231 Encounter for screening mammogram for malignant neoplasm of breast: Secondary | ICD-10-CM

## 2016-04-13 DIAGNOSIS — Z6841 Body Mass Index (BMI) 40.0 and over, adult: Secondary | ICD-10-CM | POA: Diagnosis not present

## 2016-04-13 DIAGNOSIS — Z01419 Encounter for gynecological examination (general) (routine) without abnormal findings: Secondary | ICD-10-CM | POA: Diagnosis not present

## 2016-05-10 ENCOUNTER — Ambulatory Visit (HOSPITAL_COMMUNITY)
Admission: RE | Admit: 2016-05-10 | Discharge: 2016-05-10 | Disposition: A | Discharge status: Home / Self Care | Payer: BLUE CROSS/BLUE SHIELD | Source: Ambulatory Visit | Attending: Internal Medicine | Admitting: Internal Medicine

## 2016-05-10 ENCOUNTER — Other Ambulatory Visit (HOSPITAL_COMMUNITY): Payer: Self-pay | Admitting: Internal Medicine

## 2016-05-10 DIAGNOSIS — X58XXXA Exposure to other specified factors, initial encounter: Secondary | ICD-10-CM | POA: Diagnosis not present

## 2016-05-10 DIAGNOSIS — S99911A Unspecified injury of right ankle, initial encounter: Secondary | ICD-10-CM

## 2016-05-10 DIAGNOSIS — M25471 Effusion, right ankle: Secondary | ICD-10-CM | POA: Diagnosis not present

## 2016-05-10 DIAGNOSIS — D692 Other nonthrombocytopenic purpura: Secondary | ICD-10-CM | POA: Diagnosis not present

## 2016-05-10 DIAGNOSIS — M7989 Other specified soft tissue disorders: Secondary | ICD-10-CM | POA: Diagnosis not present

## 2016-05-10 DIAGNOSIS — Z681 Body mass index (BMI) 19 or less, adult: Secondary | ICD-10-CM | POA: Diagnosis not present

## 2016-05-10 DIAGNOSIS — F419 Anxiety disorder, unspecified: Secondary | ICD-10-CM | POA: Diagnosis not present

## 2016-05-10 DIAGNOSIS — M25571 Pain in right ankle and joints of right foot: Secondary | ICD-10-CM | POA: Diagnosis not present

## 2016-05-10 DIAGNOSIS — S93401A Sprain of unspecified ligament of right ankle, initial encounter: Secondary | ICD-10-CM | POA: Diagnosis not present

## 2016-05-10 DIAGNOSIS — Z1389 Encounter for screening for other disorder: Secondary | ICD-10-CM | POA: Diagnosis not present

## 2016-05-19 DIAGNOSIS — S93411A Sprain of calcaneofibular ligament of right ankle, initial encounter: Secondary | ICD-10-CM | POA: Diagnosis not present

## 2016-05-19 DIAGNOSIS — M25571 Pain in right ankle and joints of right foot: Secondary | ICD-10-CM | POA: Diagnosis not present

## 2016-05-19 DIAGNOSIS — M2141 Flat foot [pes planus] (acquired), right foot: Secondary | ICD-10-CM | POA: Diagnosis not present

## 2016-05-19 DIAGNOSIS — M7731 Calcaneal spur, right foot: Secondary | ICD-10-CM | POA: Diagnosis not present

## 2016-05-31 DIAGNOSIS — K760 Fatty (change of) liver, not elsewhere classified: Secondary | ICD-10-CM | POA: Diagnosis not present

## 2016-06-21 DIAGNOSIS — I10 Essential (primary) hypertension: Secondary | ICD-10-CM | POA: Diagnosis not present

## 2016-06-21 DIAGNOSIS — R3129 Other microscopic hematuria: Secondary | ICD-10-CM | POA: Diagnosis not present

## 2016-06-21 DIAGNOSIS — R809 Proteinuria, unspecified: Secondary | ICD-10-CM | POA: Diagnosis not present

## 2016-08-04 DIAGNOSIS — M25512 Pain in left shoulder: Secondary | ICD-10-CM | POA: Diagnosis not present

## 2016-09-22 DIAGNOSIS — J0110 Acute frontal sinusitis, unspecified: Secondary | ICD-10-CM | POA: Diagnosis not present

## 2016-09-29 DIAGNOSIS — J0110 Acute frontal sinusitis, unspecified: Secondary | ICD-10-CM | POA: Diagnosis not present

## 2016-10-04 DIAGNOSIS — M1712 Unilateral primary osteoarthritis, left knee: Secondary | ICD-10-CM | POA: Diagnosis not present

## 2016-11-02 ENCOUNTER — Telehealth: Payer: Self-pay | Admitting: Cardiology

## 2016-11-02 NOTE — Telephone Encounter (Signed)
Numerous attempts to contact patient with recall letters. Unable to reach by telephone. with no success.  Victoria Williams, CMA [[id number]][[id number]] 05/18/2015 11:47 AM New [10]    [System] 12/23/2015 11:03 PM Notification Sent [20]   Victoria Williams [[id number]][[id number]] 07/20/2016 2:10 PM Notification Sent [20]   Victoria Williams [[id number]][[id number]] 09/29/2016 4:40 PM Notification Sent [20]   Victoria Williams [[id number]][[id number]] 10/11/2016 2:36 PM Notification Sent [20]

## 2016-12-09 DIAGNOSIS — R32 Unspecified urinary incontinence: Secondary | ICD-10-CM | POA: Diagnosis not present

## 2016-12-09 DIAGNOSIS — F419 Anxiety disorder, unspecified: Secondary | ICD-10-CM | POA: Diagnosis not present

## 2016-12-09 DIAGNOSIS — Z1389 Encounter for screening for other disorder: Secondary | ICD-10-CM | POA: Diagnosis not present

## 2016-12-09 DIAGNOSIS — Z6841 Body Mass Index (BMI) 40.0 and over, adult: Secondary | ICD-10-CM | POA: Diagnosis not present

## 2016-12-09 DIAGNOSIS — N3281 Overactive bladder: Secondary | ICD-10-CM | POA: Diagnosis not present

## 2017-01-26 DIAGNOSIS — M1712 Unilateral primary osteoarthritis, left knee: Secondary | ICD-10-CM | POA: Diagnosis not present

## 2017-01-30 DIAGNOSIS — Z86010 Personal history of colonic polyps: Secondary | ICD-10-CM | POA: Diagnosis not present

## 2017-01-30 DIAGNOSIS — K219 Gastro-esophageal reflux disease without esophagitis: Secondary | ICD-10-CM | POA: Diagnosis not present

## 2017-01-30 DIAGNOSIS — R1011 Right upper quadrant pain: Secondary | ICD-10-CM | POA: Diagnosis not present

## 2017-02-16 DIAGNOSIS — Z6841 Body Mass Index (BMI) 40.0 and over, adult: Secondary | ICD-10-CM | POA: Diagnosis not present

## 2017-02-16 DIAGNOSIS — Z1389 Encounter for screening for other disorder: Secondary | ICD-10-CM | POA: Diagnosis not present

## 2017-02-16 DIAGNOSIS — R7301 Impaired fasting glucose: Secondary | ICD-10-CM | POA: Diagnosis not present

## 2017-02-16 DIAGNOSIS — J069 Acute upper respiratory infection, unspecified: Secondary | ICD-10-CM | POA: Diagnosis not present

## 2017-03-08 ENCOUNTER — Other Ambulatory Visit: Payer: Self-pay | Admitting: Obstetrics and Gynecology

## 2017-03-08 DIAGNOSIS — Z1231 Encounter for screening mammogram for malignant neoplasm of breast: Secondary | ICD-10-CM

## 2017-03-20 ENCOUNTER — Encounter (HOSPITAL_COMMUNITY): Payer: Self-pay

## 2017-03-20 ENCOUNTER — Emergency Department (HOSPITAL_COMMUNITY)
Admission: EM | Admit: 2017-03-20 | Discharge: 2017-03-20 | Disposition: A | Discharge status: Home / Self Care | Payer: BLUE CROSS/BLUE SHIELD | Attending: Emergency Medicine | Admitting: Emergency Medicine

## 2017-03-20 ENCOUNTER — Emergency Department (HOSPITAL_COMMUNITY): Payer: BLUE CROSS/BLUE SHIELD

## 2017-03-20 DIAGNOSIS — I129 Hypertensive chronic kidney disease with stage 1 through stage 4 chronic kidney disease, or unspecified chronic kidney disease: Secondary | ICD-10-CM | POA: Insufficient documentation

## 2017-03-20 DIAGNOSIS — E1122 Type 2 diabetes mellitus with diabetic chronic kidney disease: Secondary | ICD-10-CM | POA: Insufficient documentation

## 2017-03-20 DIAGNOSIS — N189 Chronic kidney disease, unspecified: Secondary | ICD-10-CM | POA: Diagnosis not present

## 2017-03-20 DIAGNOSIS — Z79899 Other long term (current) drug therapy: Secondary | ICD-10-CM | POA: Diagnosis not present

## 2017-03-20 DIAGNOSIS — J209 Acute bronchitis, unspecified: Secondary | ICD-10-CM | POA: Diagnosis not present

## 2017-03-20 DIAGNOSIS — J40 Bronchitis, not specified as acute or chronic: Secondary | ICD-10-CM | POA: Diagnosis not present

## 2017-03-20 DIAGNOSIS — R05 Cough: Secondary | ICD-10-CM | POA: Diagnosis not present

## 2017-03-20 MED ORDER — ALBUTEROL SULFATE HFA 108 (90 BASE) MCG/ACT IN AERS: 2.0000 | INHALATION_SPRAY | Freq: Four times a day (QID) | RESPIRATORY_TRACT | 0 refills | Status: DC | PRN

## 2017-03-20 MED ORDER — DOXYCYCLINE HYCLATE 100 MG PO CAPS: 100.0000 mg | ORAL_CAPSULE | Freq: Two times a day (BID) | ORAL | 0 refills | Status: DC

## 2017-03-20 MED ORDER — BENZONATATE 100 MG PO CAPS: 100.0000 mg | ORAL_CAPSULE | Freq: Three times a day (TID) | ORAL | 0 refills | Status: DC

## 2017-03-20 NOTE — ED Provider Notes (Signed)
[facility] DEPT Provider Note   CSN: [id number] Arrival date & time: 03/20/17  [id number]     History   Chief Complaint Chief Complaint  Patient presents with  . Cough    HPI Victoria Williams is a 51 y.o. female.  Patient presents with a five-day history of cough, chest congestion, runny nose, sore throat and chills. No documented fever. Cough is nonproductive and feels like something is caught in her throat. She's had lots of nasal and sinus drainage. She's had sick contacts at home. No documented fever. No chest pain. No shortness of breath. No history of asthma or COPD. She does not smoke. No abdominal pain, nausea, vomiting or diarrhea. No leg swelling. She with chronic facial droop due to Bell's palsy which is unchanged.   The history is provided by the patient.    Past Medical History:  Diagnosis Date  . Arthritis   . Chronic kidney disease   . Diabetes mellitus    only when on steriods  . Esophagitis 2012   EGD  . Henoch-Schonlein purpura (HCC)   . History of kidney stones   . History of UTI   . Hypertension   . IBS (irritable bowel syndrome)   . Obesity     Patient Active Problem List   Diagnosis Date Noted  . Facial paralysis/Bells palsy 01/27/2014  . Facial droop 01/26/2014  . Facial weakness 01/26/2014  . Bloating 09/30/2013  . Loss of weight 09/30/2013  . Abdominal pain, other specified site 09/30/2013  . Abdominal pain, epigastric 02/10/2011  . Henoch-Schonlein purpura (HCC) 02/10/2011  . Diabetes mellitus without mention of complication 02/10/2011    Past Surgical History:  Procedure Laterality Date  . ABDOMINAL HYSTERECTOMY    . ACHILLES TENDON SURGERY    . CESAREAN SECTION    . KNEE SURGERY    . NECK SURGERY     cervical fusion 6/7  . TONSILECTOMY, ADENOIDECTOMY, BILATERAL MYRINGOTOMY AND TUBES    . TONSILLECTOMY    . TUBAL LIGATION      OB History    No data available       Home Medications    Prior to Admission medications     Medication Sig Start Date End Date Taking? Authorizing Provider  acetaminophen (TYLENOL) 500 MG tablet Take 1,000 mg by mouth 2 (two) times daily as needed for headache.    [provider]  calcium carbonate (TUMS - DOSED IN MG ELEMENTAL CALCIUM) 500 MG chewable tablet Chew 1 tablet by mouth daily.    [provider]  ibuprofen (ADVIL,MOTRIN) 200 MG tablet Take 600 mg by mouth 2 (two) times daily as needed for moderate pain.    [provider]  omeprazole (PRILOSEC) 10 MG capsule Take 10 mg by mouth daily.    [provider]  oxyCODONE-acetaminophen (PERCOCET/ROXICET) 5-325 MG tablet Take 1-2 tablets by mouth every 6 (six) hours as needed for severe pain. 02/13/16   Horton, Mayer Maskerourtney F, MD  polyethylene glycol (MIRALAX / GLYCOLAX) packet Take 17 g by mouth daily.    [provider]  valACYclovir (VALTREX) 1000 MG tablet Take 1 tablet (1,000 mg total) by mouth 3 (three) times daily. 02/13/16   Horton, Mayer Maskerourtney F, MD    Family History Family History  Problem Relation Age of Onset  . Leukemia Mother   . Diabetes Mother   . Esophageal cancer Brother   . Diabetes Maternal Aunt   . Diabetes Maternal Uncle   . Prostate cancer Maternal Grandmother   .  Diabetes Maternal Grandfather   . Colon cancer Neg Hx     Social History Social History  Substance Use Topics  . Smoking status: Never Smoker  . Smokeless tobacco: Never Used  . Alcohol use 0.6 oz/week    1 Glasses of wine per week     Comment: 2 a month      Allergies   Septra [bactrim]; Sulfa antibiotics; Cephalexin; Ciprocin-fluocin-procin [fluocinolone acetonide]; Bee venom; Levofloxacin; and Penicillins   Review of Systems Review of Systems  Constitutional: Positive for chills. Negative for activity change, appetite change and fever.  HENT: Positive for congestion, postnasal drip, rhinorrhea, sinus pain and sore throat.   Respiratory: Positive for cough. Negative for shortness of breath.    Cardiovascular: Negative for chest pain.  Gastrointestinal: Negative for abdominal pain, nausea and vomiting.  Genitourinary: Negative for dysuria, hematuria, vaginal bleeding and vaginal discharge.  Musculoskeletal: Negative for arthralgias and myalgias.  Skin: Negative for pallor and rash.  Neurological: Negative for dizziness, weakness and headaches.   all other systems are negative except as noted in the HPI and PMH.     Physical Exam Updated Vital Signs BP (!) 153/100 (BP Location: Left Arm)   Pulse 92   Temp 98.4 F (36.9 C) (Oral)   Resp (!) 22   Ht 5\' 4"  (1.626 m)   Wt 104.3 kg (230 lb)   SpO2 93%   BMI 39.48 kg/m   Physical Exam  Constitutional: She is oriented to person, place, and time. She appears well-developed and well-nourished. No distress.  HENT:  Head: Normocephalic and atraumatic.  Mouth/Throat: Oropharynx is clear and moist. No oropharyngeal exudate.  + post nasal drip  Eyes: Conjunctivae and EOM are normal. Pupils are equal, round, and reactive to light.  Neck: Normal range of motion. Neck supple.  No meningismus.  Cardiovascular: Normal rate, regular rhythm, normal heart sounds and intact distal pulses.   No murmur heard. Pulmonary/Chest: Effort normal and breath sounds normal. No respiratory distress. She has no wheezes.  Abdominal: Soft. There is no tenderness. There is no rebound and no guarding.  Musculoskeletal: Normal range of motion. She exhibits no edema or tenderness.  Neurological: She is alert and oriented to person, place, and time. No cranial nerve deficit. She exhibits normal muscle tone. Coordination normal.   5/5 strength throughout. L sided facial weakness.Equal grip strength.    Skin: Skin is warm.  Psychiatric: She has a normal mood and affect. Her behavior is normal.  Nursing note and vitals reviewed.    ED Treatments / Results  Labs (all labs ordered are listed, but only abnormal results are displayed) Labs Reviewed - No  data to display  EKG  EKG Interpretation None       Radiology Dg Chest 2 View  Result Date: 03/20/2017 CLINICAL DATA:  Dry cough EXAM: CHEST  2 VIEW COMPARISON:  05/02/2013 FINDINGS: Cardiac shadow is within normal limits. The lungs are well aerated bilaterally. No focal infiltrate or sizable effusion is seen. Postsurgical changes in the cervical spine are noted. Mild degenerative change in the thoracic spine is seen. IMPRESSION: No active cardiopulmonary disease. Electronically Signed   By: Alcide Clever M.D.   On: 03/20/2017 07:09    Procedures Procedures (including critical care time)  Medications Ordered in ED Medications - No data to display   Initial Impression / Assessment and Plan / ED Course  I have reviewed the triage vital signs and the nursing notes.  Pertinent labs & imaging results that  were available during my care of the patient were reviewed by me and considered in my medical decision making (see chart for details).     5 days of cough, congestion, sore throat and nasal drip. No fever. No chest pain or shortness of breath.  Patient appears well and nontoxic. She is not hypoxic. Recheck of oxygen saturation is 97%. She is not wheezing.  X-rays negative for infiltrate. We'll treat for bronchitis. Given bronchodilators, antitussives and antibiotics. Follow-up with PCP. Return precautions discussed.  Final Clinical Impressions(s) / ED Diagnoses   Final diagnoses:  Bronchitis    New Prescriptions New Prescriptions   No medications on file     Glynn Octave, MD 03/20/17 [phone]

## 2017-03-20 NOTE — ED Triage Notes (Signed)
Pt reports dry cough that started about 5 days ago. Started taking Sudafed and started feeling better, then got worse. No fever in the past couple of days.

## 2017-03-20 NOTE — Discharge Instructions (Signed)
Take the antibiotics as prescribed.  Follow up with your doctor. Return to the ED if you develop new or worsening symptoms. °

## 2017-03-23 DIAGNOSIS — K219 Gastro-esophageal reflux disease without esophagitis: Secondary | ICD-10-CM | POA: Diagnosis not present

## 2017-03-23 DIAGNOSIS — Z0182 Encounter for allergy testing: Secondary | ICD-10-CM | POA: Diagnosis not present

## 2017-03-23 DIAGNOSIS — R51 Headache: Secondary | ICD-10-CM | POA: Diagnosis not present

## 2017-03-23 DIAGNOSIS — M2550 Pain in unspecified joint: Secondary | ICD-10-CM | POA: Diagnosis not present

## 2017-04-03 ENCOUNTER — Ambulatory Visit
Admission: RE | Admit: 2017-04-03 | Discharge: 2017-04-03 | Disposition: A | Discharge status: Home / Self Care | Payer: BLUE CROSS/BLUE SHIELD | Source: Ambulatory Visit | Attending: Obstetrics and Gynecology | Admitting: Obstetrics and Gynecology

## 2017-04-03 DIAGNOSIS — Z1231 Encounter for screening mammogram for malignant neoplasm of breast: Secondary | ICD-10-CM

## 2017-07-06 DIAGNOSIS — M1712 Unilateral primary osteoarthritis, left knee: Secondary | ICD-10-CM | POA: Diagnosis not present

## 2017-07-18 DIAGNOSIS — E669 Obesity, unspecified: Secondary | ICD-10-CM | POA: Diagnosis not present

## 2017-07-18 DIAGNOSIS — M1712 Unilateral primary osteoarthritis, left knee: Secondary | ICD-10-CM | POA: Diagnosis not present

## 2017-07-25 DIAGNOSIS — G510 Bell's palsy: Secondary | ICD-10-CM | POA: Diagnosis not present

## 2017-07-25 DIAGNOSIS — L299 Pruritus, unspecified: Secondary | ICD-10-CM | POA: Diagnosis not present

## 2017-07-25 DIAGNOSIS — G4730 Sleep apnea, unspecified: Secondary | ICD-10-CM | POA: Diagnosis not present

## 2017-08-02 DIAGNOSIS — M1712 Unilateral primary osteoarthritis, left knee: Secondary | ICD-10-CM | POA: Diagnosis not present

## 2017-08-07 DIAGNOSIS — R809 Proteinuria, unspecified: Secondary | ICD-10-CM | POA: Diagnosis not present

## 2017-08-07 DIAGNOSIS — R739 Hyperglycemia, unspecified: Secondary | ICD-10-CM | POA: Diagnosis not present

## 2017-08-07 DIAGNOSIS — I10 Essential (primary) hypertension: Secondary | ICD-10-CM | POA: Diagnosis not present

## 2017-08-07 DIAGNOSIS — R3129 Other microscopic hematuria: Secondary | ICD-10-CM | POA: Diagnosis not present

## 2017-08-09 DIAGNOSIS — M1712 Unilateral primary osteoarthritis, left knee: Secondary | ICD-10-CM | POA: Diagnosis not present

## 2017-08-14 DIAGNOSIS — Z6841 Body Mass Index (BMI) 40.0 and over, adult: Secondary | ICD-10-CM | POA: Diagnosis not present

## 2017-08-14 DIAGNOSIS — Z23 Encounter for immunization: Secondary | ICD-10-CM | POA: Diagnosis not present

## 2017-08-14 DIAGNOSIS — E1165 Type 2 diabetes mellitus with hyperglycemia: Secondary | ICD-10-CM | POA: Diagnosis not present

## 2017-08-14 DIAGNOSIS — I10 Essential (primary) hypertension: Secondary | ICD-10-CM | POA: Diagnosis not present

## 2017-08-16 DIAGNOSIS — M1712 Unilateral primary osteoarthritis, left knee: Secondary | ICD-10-CM | POA: Diagnosis not present

## 2017-08-25 DIAGNOSIS — Z01411 Encounter for gynecological examination (general) (routine) with abnormal findings: Secondary | ICD-10-CM | POA: Diagnosis not present

## 2017-08-25 DIAGNOSIS — Z975 Presence of (intrauterine) contraceptive device: Secondary | ICD-10-CM | POA: Diagnosis not present

## 2017-08-25 DIAGNOSIS — R1084 Generalized abdominal pain: Secondary | ICD-10-CM | POA: Diagnosis not present

## 2017-10-09 DIAGNOSIS — L301 Dyshidrosis [pompholyx]: Secondary | ICD-10-CM | POA: Diagnosis not present

## 2017-10-09 DIAGNOSIS — B078 Other viral warts: Secondary | ICD-10-CM | POA: Diagnosis not present

## 2017-10-09 DIAGNOSIS — B353 Tinea pedis: Secondary | ICD-10-CM | POA: Diagnosis not present

## 2017-10-24 HISTORY — PX: REPLACEMENT TOTAL KNEE: SUR1224

## 2017-10-30 DIAGNOSIS — Z6841 Body Mass Index (BMI) 40.0 and over, adult: Secondary | ICD-10-CM | POA: Diagnosis not present

## 2017-10-30 DIAGNOSIS — N342 Other urethritis: Secondary | ICD-10-CM | POA: Diagnosis not present

## 2017-10-30 DIAGNOSIS — Z1389 Encounter for screening for other disorder: Secondary | ICD-10-CM | POA: Diagnosis not present

## 2017-10-30 DIAGNOSIS — R350 Frequency of micturition: Secondary | ICD-10-CM | POA: Diagnosis not present

## 2017-12-20 DIAGNOSIS — M1712 Unilateral primary osteoarthritis, left knee: Secondary | ICD-10-CM | POA: Diagnosis not present

## 2018-01-04 DIAGNOSIS — E1165 Type 2 diabetes mellitus with hyperglycemia: Secondary | ICD-10-CM | POA: Diagnosis not present

## 2018-01-04 DIAGNOSIS — R946 Abnormal results of thyroid function studies: Secondary | ICD-10-CM | POA: Diagnosis not present

## 2018-01-04 DIAGNOSIS — Z6841 Body Mass Index (BMI) 40.0 and over, adult: Secondary | ICD-10-CM | POA: Diagnosis not present

## 2018-01-04 DIAGNOSIS — R221 Localized swelling, mass and lump, neck: Secondary | ICD-10-CM | POA: Diagnosis not present

## 2018-01-04 DIAGNOSIS — G510 Bell's palsy: Secondary | ICD-10-CM | POA: Diagnosis not present

## 2018-01-04 DIAGNOSIS — I10 Essential (primary) hypertension: Secondary | ICD-10-CM | POA: Diagnosis not present

## 2018-01-04 DIAGNOSIS — F419 Anxiety disorder, unspecified: Secondary | ICD-10-CM | POA: Diagnosis not present

## 2018-01-04 DIAGNOSIS — Z1389 Encounter for screening for other disorder: Secondary | ICD-10-CM | POA: Diagnosis not present

## 2018-01-04 DIAGNOSIS — E782 Mixed hyperlipidemia: Secondary | ICD-10-CM | POA: Diagnosis not present

## 2018-01-10 ENCOUNTER — Other Ambulatory Visit: Payer: Self-pay | Admitting: Family Medicine

## 2018-01-10 ENCOUNTER — Other Ambulatory Visit (HOSPITAL_COMMUNITY): Payer: Self-pay | Admitting: Family Medicine

## 2018-01-10 DIAGNOSIS — R946 Abnormal results of thyroid function studies: Secondary | ICD-10-CM

## 2018-01-10 DIAGNOSIS — R221 Localized swelling, mass and lump, neck: Secondary | ICD-10-CM

## 2018-01-16 ENCOUNTER — Ambulatory Visit (HOSPITAL_COMMUNITY)
Admission: RE | Admit: 2018-01-16 | Discharge: 2018-01-16 | Disposition: A | Discharge status: Home / Self Care | Payer: BLUE CROSS/BLUE SHIELD | Source: Ambulatory Visit | Attending: Family Medicine | Admitting: Family Medicine

## 2018-01-16 DIAGNOSIS — G510 Bell's palsy: Secondary | ICD-10-CM | POA: Insufficient documentation

## 2018-01-16 DIAGNOSIS — R221 Localized swelling, mass and lump, neck: Secondary | ICD-10-CM | POA: Diagnosis not present

## 2018-01-16 DIAGNOSIS — R946 Abnormal results of thyroid function studies: Secondary | ICD-10-CM

## 2018-02-14 DIAGNOSIS — M1712 Unilateral primary osteoarthritis, left knee: Secondary | ICD-10-CM | POA: Diagnosis not present

## 2018-02-27 DIAGNOSIS — E1165 Type 2 diabetes mellitus with hyperglycemia: Secondary | ICD-10-CM | POA: Diagnosis not present

## 2018-02-27 DIAGNOSIS — Z01810 Encounter for preprocedural cardiovascular examination: Secondary | ICD-10-CM | POA: Diagnosis not present

## 2018-02-27 DIAGNOSIS — I10 Essential (primary) hypertension: Secondary | ICD-10-CM | POA: Diagnosis not present

## 2018-02-27 DIAGNOSIS — Z01818 Encounter for other preprocedural examination: Secondary | ICD-10-CM | POA: Diagnosis not present

## 2018-02-27 DIAGNOSIS — M1712 Unilateral primary osteoarthritis, left knee: Secondary | ICD-10-CM | POA: Diagnosis not present

## 2018-02-27 DIAGNOSIS — E119 Type 2 diabetes mellitus without complications: Secondary | ICD-10-CM | POA: Diagnosis not present

## 2018-02-27 DIAGNOSIS — Z01812 Encounter for preprocedural laboratory examination: Secondary | ICD-10-CM | POA: Diagnosis not present

## 2018-03-08 DIAGNOSIS — Z96652 Presence of left artificial knee joint: Secondary | ICD-10-CM | POA: Diagnosis not present

## 2018-03-08 DIAGNOSIS — D690 Allergic purpura: Secondary | ICD-10-CM | POA: Diagnosis not present

## 2018-03-08 DIAGNOSIS — Z885 Allergy status to narcotic agent status: Secondary | ICD-10-CM | POA: Diagnosis not present

## 2018-03-08 DIAGNOSIS — K219 Gastro-esophageal reflux disease without esophagitis: Secondary | ICD-10-CM | POA: Diagnosis not present

## 2018-03-08 DIAGNOSIS — Z91030 Bee allergy status: Secondary | ICD-10-CM | POA: Diagnosis not present

## 2018-03-08 DIAGNOSIS — E119 Type 2 diabetes mellitus without complications: Secondary | ICD-10-CM | POA: Diagnosis not present

## 2018-03-08 DIAGNOSIS — M1712 Unilateral primary osteoarthritis, left knee: Secondary | ICD-10-CM | POA: Diagnosis not present

## 2018-03-08 DIAGNOSIS — Z7984 Long term (current) use of oral hypoglycemic drugs: Secondary | ICD-10-CM | POA: Diagnosis not present

## 2018-03-08 DIAGNOSIS — Z6838 Body mass index (BMI) 38.0-38.9, adult: Secondary | ICD-10-CM | POA: Diagnosis not present

## 2018-03-08 DIAGNOSIS — Z789 Other specified health status: Secondary | ICD-10-CM | POA: Diagnosis not present

## 2018-03-08 DIAGNOSIS — N200 Calculus of kidney: Secondary | ICD-10-CM | POA: Diagnosis not present

## 2018-03-08 DIAGNOSIS — Z79899 Other long term (current) drug therapy: Secondary | ICD-10-CM | POA: Diagnosis not present

## 2018-03-08 DIAGNOSIS — G510 Bell's palsy: Secondary | ICD-10-CM | POA: Diagnosis not present

## 2018-03-08 DIAGNOSIS — E669 Obesity, unspecified: Secondary | ICD-10-CM | POA: Diagnosis not present

## 2018-03-08 DIAGNOSIS — I10 Essential (primary) hypertension: Secondary | ICD-10-CM | POA: Diagnosis not present

## 2018-03-08 DIAGNOSIS — Z87442 Personal history of urinary calculi: Secondary | ICD-10-CM | POA: Diagnosis not present

## 2018-03-08 DIAGNOSIS — Z881 Allergy status to other antibiotic agents status: Secondary | ICD-10-CM | POA: Diagnosis not present

## 2018-03-08 DIAGNOSIS — G8918 Other acute postprocedural pain: Secondary | ICD-10-CM | POA: Diagnosis not present

## 2018-03-08 DIAGNOSIS — Z471 Aftercare following joint replacement surgery: Secondary | ICD-10-CM | POA: Diagnosis not present

## 2018-03-10 DIAGNOSIS — Z791 Long term (current) use of non-steroidal anti-inflammatories (NSAID): Secondary | ICD-10-CM | POA: Diagnosis not present

## 2018-03-10 DIAGNOSIS — Z96652 Presence of left artificial knee joint: Secondary | ICD-10-CM | POA: Diagnosis not present

## 2018-03-10 DIAGNOSIS — Z471 Aftercare following joint replacement surgery: Secondary | ICD-10-CM | POA: Diagnosis not present

## 2018-03-10 DIAGNOSIS — Z7982 Long term (current) use of aspirin: Secondary | ICD-10-CM | POA: Diagnosis not present

## 2018-03-10 DIAGNOSIS — Z7984 Long term (current) use of oral hypoglycemic drugs: Secondary | ICD-10-CM | POA: Diagnosis not present

## 2018-03-10 DIAGNOSIS — N189 Chronic kidney disease, unspecified: Secondary | ICD-10-CM | POA: Diagnosis not present

## 2018-03-12 DIAGNOSIS — Z7982 Long term (current) use of aspirin: Secondary | ICD-10-CM | POA: Diagnosis not present

## 2018-03-12 DIAGNOSIS — Z96652 Presence of left artificial knee joint: Secondary | ICD-10-CM | POA: Diagnosis not present

## 2018-03-12 DIAGNOSIS — Z7984 Long term (current) use of oral hypoglycemic drugs: Secondary | ICD-10-CM | POA: Diagnosis not present

## 2018-03-12 DIAGNOSIS — Z791 Long term (current) use of non-steroidal anti-inflammatories (NSAID): Secondary | ICD-10-CM | POA: Diagnosis not present

## 2018-03-12 DIAGNOSIS — N189 Chronic kidney disease, unspecified: Secondary | ICD-10-CM | POA: Diagnosis not present

## 2018-03-12 DIAGNOSIS — Z471 Aftercare following joint replacement surgery: Secondary | ICD-10-CM | POA: Diagnosis not present

## 2018-03-14 DIAGNOSIS — Z471 Aftercare following joint replacement surgery: Secondary | ICD-10-CM | POA: Diagnosis not present

## 2018-03-14 DIAGNOSIS — Z7982 Long term (current) use of aspirin: Secondary | ICD-10-CM | POA: Diagnosis not present

## 2018-03-14 DIAGNOSIS — Z96652 Presence of left artificial knee joint: Secondary | ICD-10-CM | POA: Diagnosis not present

## 2018-03-14 DIAGNOSIS — N189 Chronic kidney disease, unspecified: Secondary | ICD-10-CM | POA: Diagnosis not present

## 2018-03-14 DIAGNOSIS — Z791 Long term (current) use of non-steroidal anti-inflammatories (NSAID): Secondary | ICD-10-CM | POA: Diagnosis not present

## 2018-03-14 DIAGNOSIS — Z7984 Long term (current) use of oral hypoglycemic drugs: Secondary | ICD-10-CM | POA: Diagnosis not present

## 2018-03-15 DIAGNOSIS — Z471 Aftercare following joint replacement surgery: Secondary | ICD-10-CM | POA: Diagnosis not present

## 2018-03-15 DIAGNOSIS — Z7982 Long term (current) use of aspirin: Secondary | ICD-10-CM | POA: Diagnosis not present

## 2018-03-15 DIAGNOSIS — Z7984 Long term (current) use of oral hypoglycemic drugs: Secondary | ICD-10-CM | POA: Diagnosis not present

## 2018-03-15 DIAGNOSIS — Z791 Long term (current) use of non-steroidal anti-inflammatories (NSAID): Secondary | ICD-10-CM | POA: Diagnosis not present

## 2018-03-15 DIAGNOSIS — Z96652 Presence of left artificial knee joint: Secondary | ICD-10-CM | POA: Diagnosis not present

## 2018-03-15 DIAGNOSIS — N189 Chronic kidney disease, unspecified: Secondary | ICD-10-CM | POA: Diagnosis not present

## 2018-03-16 ENCOUNTER — Ambulatory Visit: Payer: BLUE CROSS/BLUE SHIELD | Admitting: Diagnostic Neuroimaging

## 2018-03-16 ENCOUNTER — Encounter

## 2018-03-20 ENCOUNTER — Telehealth (HOSPITAL_COMMUNITY): Payer: Self-pay | Admitting: Family Medicine

## 2018-03-20 DIAGNOSIS — Z7984 Long term (current) use of oral hypoglycemic drugs: Secondary | ICD-10-CM | POA: Diagnosis not present

## 2018-03-20 DIAGNOSIS — N189 Chronic kidney disease, unspecified: Secondary | ICD-10-CM | POA: Diagnosis not present

## 2018-03-20 DIAGNOSIS — Z471 Aftercare following joint replacement surgery: Secondary | ICD-10-CM | POA: Diagnosis not present

## 2018-03-20 DIAGNOSIS — Z7982 Long term (current) use of aspirin: Secondary | ICD-10-CM | POA: Diagnosis not present

## 2018-03-20 DIAGNOSIS — Z791 Long term (current) use of non-steroidal anti-inflammatories (NSAID): Secondary | ICD-10-CM | POA: Diagnosis not present

## 2018-03-20 DIAGNOSIS — Z96652 Presence of left artificial knee joint: Secondary | ICD-10-CM | POA: Diagnosis not present

## 2018-03-20 NOTE — Telephone Encounter (Signed)
03/20/18  pt cx said that home health wasn't done yet

## 2018-03-22 DIAGNOSIS — Z96652 Presence of left artificial knee joint: Secondary | ICD-10-CM | POA: Diagnosis not present

## 2018-03-22 DIAGNOSIS — N189 Chronic kidney disease, unspecified: Secondary | ICD-10-CM | POA: Diagnosis not present

## 2018-03-22 DIAGNOSIS — Z791 Long term (current) use of non-steroidal anti-inflammatories (NSAID): Secondary | ICD-10-CM | POA: Diagnosis not present

## 2018-03-22 DIAGNOSIS — Z471 Aftercare following joint replacement surgery: Secondary | ICD-10-CM | POA: Diagnosis not present

## 2018-03-22 DIAGNOSIS — Z7984 Long term (current) use of oral hypoglycemic drugs: Secondary | ICD-10-CM | POA: Diagnosis not present

## 2018-03-22 DIAGNOSIS — Z7982 Long term (current) use of aspirin: Secondary | ICD-10-CM | POA: Diagnosis not present

## 2018-03-23 DIAGNOSIS — Z791 Long term (current) use of non-steroidal anti-inflammatories (NSAID): Secondary | ICD-10-CM | POA: Diagnosis not present

## 2018-03-23 DIAGNOSIS — Z471 Aftercare following joint replacement surgery: Secondary | ICD-10-CM | POA: Diagnosis not present

## 2018-03-23 DIAGNOSIS — N189 Chronic kidney disease, unspecified: Secondary | ICD-10-CM | POA: Diagnosis not present

## 2018-03-23 DIAGNOSIS — Z7982 Long term (current) use of aspirin: Secondary | ICD-10-CM | POA: Diagnosis not present

## 2018-03-23 DIAGNOSIS — Z7984 Long term (current) use of oral hypoglycemic drugs: Secondary | ICD-10-CM | POA: Diagnosis not present

## 2018-03-23 DIAGNOSIS — Z96652 Presence of left artificial knee joint: Secondary | ICD-10-CM | POA: Diagnosis not present

## 2018-03-26 ENCOUNTER — Ambulatory Visit (HOSPITAL_COMMUNITY): Payer: BLUE CROSS/BLUE SHIELD

## 2018-03-26 ENCOUNTER — Encounter (HOSPITAL_COMMUNITY): Payer: Self-pay

## 2018-03-27 DIAGNOSIS — Z7982 Long term (current) use of aspirin: Secondary | ICD-10-CM | POA: Diagnosis not present

## 2018-03-27 DIAGNOSIS — Z471 Aftercare following joint replacement surgery: Secondary | ICD-10-CM | POA: Diagnosis not present

## 2018-03-27 DIAGNOSIS — Z96652 Presence of left artificial knee joint: Secondary | ICD-10-CM | POA: Diagnosis not present

## 2018-03-27 DIAGNOSIS — Z7984 Long term (current) use of oral hypoglycemic drugs: Secondary | ICD-10-CM | POA: Diagnosis not present

## 2018-03-27 DIAGNOSIS — N189 Chronic kidney disease, unspecified: Secondary | ICD-10-CM | POA: Diagnosis not present

## 2018-03-27 DIAGNOSIS — Z791 Long term (current) use of non-steroidal anti-inflammatories (NSAID): Secondary | ICD-10-CM | POA: Diagnosis not present

## 2018-03-30 DIAGNOSIS — Z791 Long term (current) use of non-steroidal anti-inflammatories (NSAID): Secondary | ICD-10-CM | POA: Diagnosis not present

## 2018-03-30 DIAGNOSIS — Z7982 Long term (current) use of aspirin: Secondary | ICD-10-CM | POA: Diagnosis not present

## 2018-03-30 DIAGNOSIS — N189 Chronic kidney disease, unspecified: Secondary | ICD-10-CM | POA: Diagnosis not present

## 2018-03-30 DIAGNOSIS — Z471 Aftercare following joint replacement surgery: Secondary | ICD-10-CM | POA: Diagnosis not present

## 2018-03-30 DIAGNOSIS — Z7984 Long term (current) use of oral hypoglycemic drugs: Secondary | ICD-10-CM | POA: Diagnosis not present

## 2018-03-30 DIAGNOSIS — Z96652 Presence of left artificial knee joint: Secondary | ICD-10-CM | POA: Diagnosis not present

## 2018-04-02 ENCOUNTER — Ambulatory Visit (HOSPITAL_COMMUNITY): Payer: BLUE CROSS/BLUE SHIELD | Attending: Orthopedic Surgery

## 2018-04-02 ENCOUNTER — Other Ambulatory Visit: Payer: Self-pay

## 2018-04-02 ENCOUNTER — Encounter (HOSPITAL_COMMUNITY): Payer: Self-pay

## 2018-04-02 DIAGNOSIS — R262 Difficulty in walking, not elsewhere classified: Secondary | ICD-10-CM

## 2018-04-02 DIAGNOSIS — R2689 Other abnormalities of gait and mobility: Secondary | ICD-10-CM | POA: Diagnosis not present

## 2018-04-02 DIAGNOSIS — M25562 Pain in left knee: Secondary | ICD-10-CM | POA: Diagnosis not present

## 2018-04-02 DIAGNOSIS — M6281 Muscle weakness (generalized): Secondary | ICD-10-CM | POA: Insufficient documentation

## 2018-04-02 NOTE — Addendum Note (Signed)
Addended by: Epifanio LeschesHARTNETT-RANDS, Eiza Canniff on: 04/02/2018 04:14 PM   Modules accepted: Orders

## 2018-04-02 NOTE — Patient Instructions (Signed)
Seated heel slides, standing - heel raises, hamstring curl, walking up stairs; needs assistance for safety to walk down stairs.

## 2018-04-02 NOTE — Therapy (Addendum)
[facility] [address] [place], [place], [address] Phone: [phone]   Fax:  [phone]  Physical Therapy Evaluation  Patient Details  Name: Victoria Williams MRN: [id number] Date of Birth: Sep 07, 1966 Referring Provider: Roda Shutters   Encounter Date: 04/02/2018  PT End of Session - 04/02/18 1543    Visit Number  1    Number of Visits  12    Date for PT Re-Evaluation  05/14/18 mini-reassess 04/23/18    Authorization Type  [employer]    Authorization Time Period  04/02/18-04/25/18    Authorization - Visit Number  1    Authorization - Number of Visits  53 7 of 60 utilized    PT Start Time  1303    PT Stop Time  1350    PT Time Calculation (min)  47 min    Activity Tolerance  Patient tolerated treatment well    Behavior During Therapy  [facility] for tasks assessed/performed       Past Medical History:  Diagnosis Date  . Arthritis   . Chronic kidney disease   . Diabetes mellitus    only when on steriods  . Esophagitis 2012   EGD  . Henoch-Schonlein purpura (HCC)   . History of kidney stones   . History of UTI   . Hypertension   . IBS (irritable bowel syndrome)   . Obesity     Past Surgical History:  Procedure Laterality Date  . ABDOMINAL HYSTERECTOMY    . ACHILLES TENDON SURGERY    . CESAREAN SECTION    . KNEE SURGERY    . NECK SURGERY     cervical fusion 6/7  . TONSILECTOMY, ADENOIDECTOMY, BILATERAL MYRINGOTOMY AND TUBES    . TONSILLECTOMY    . TUBAL LIGATION      There were no vitals filed for this visit.   Subjective Assessment - 04/02/18 1130    Subjective  Lt TKR 03/08/2018 after cortisone injections and gel shots. Planning on Rt TKR in future.     Pertinent History  C-spine fusion, Lt calf surgery for cyst removal, Lt posterior thigh numbness - workup pending for lt hip and Low back, Bell's Palsy, DM    Limitations  Sitting;Lifting;Standing;Walking;House hold activities    How long can you sit comfortably?  30  minutes    How long can you stand comfortably?  20 minutes    How long can you walk comfortably?  3-5 minutes    Patient Stated Goals  To get as much ability back out of Lt knee as possible over 90% hopefully    Currently in Pain?  Yes    Pain Score  5     Pain Location  Knee    Pain Orientation  Left;Posterior    Pain Descriptors / Indicators  Aching;Dull    Pain Onset  1 to 4 weeks ago    Pain Frequency  Intermittent    Aggravating Factors   sitting, standing, walking, lifting, inactivity or too much activity    Pain Relieving Factors  changing positions; using ice machine    Effect of Pain on Daily Activities  limits ability to do things like laundry because of having to go downstairs; limits ability to get in and out of high side tub         [facility] PT Assessment - 04/02/18 0001      Assessment   Medical Diagnosis  Lt TKR     Referring Provider  Caryl Ada  McCabe    Onset Date/Surgical Date  03/08/18    Prior Therapy  HHPT      Precautions   Precautions  None      Restrictions   Weight Bearing Restrictions  No      Balance Screen   Has the patient fallen in the past 6 months  No    Has the patient had a decrease in activity level because of a fear of falling?   Yes    Is the patient reluctant to leave their home because of a fear of falling?   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      Cognition   Overall Cognitive Status  Within Functional Limits for tasks assessed      Observation/Other Assessments   Observations  butterfly strips all still present    Focus on Therapeutic Outcomes (FOTO)   51% limited      Sit to Stand   Comments  --      ROM / Strength   AROM / PROM / Strength  AROM;Strength      AROM   Left Knee Extension  -6    Left Knee Flexion  104      Strength    Right Hip Flexion  4/5    Right Hip Extension  4-/5    Right Hip ABduction  4/5    Left Hip Flexion  4-/5    Left Hip Extension  3+/5    Left Hip ABduction  4-/5    Right Knee Flexion  4/5    Right Knee Extension  4/5    Left Knee Flexion  3/5    Left Knee Extension  4-/5    Right Ankle Dorsiflexion  4/5    Left Ankle Dorsiflexion  4-/5      Transfers   Five time sit to stand comments   19.23      Ambulation/Gait   Ambulation/Gait  Yes    Ambulation Distance (Feet)  412 Feet    Assistive device  None    Gait Pattern  Decreased step length - right;Decreased stance time - left;Decreased dorsiflexion - left;Antalgic    Gait velocity  0.76 m/s                Objective measurements completed on examination: See above findings.              PT Education - 04/02/18 1542    Education Details  Reviewed HHPT HEP, importance of OPPT and HEP    Person(s) Educated  Patient    Methods  Explanation    Comprehension  Verbalized understanding       PT Short Term Goals - 04/02/18 1555      PT SHORT TERM GOAL #1   Title  Pt will exhibit lt knee flexion to 110 degrees and extension to -3 degrees to improve ability to ascend and descend stairs with a more fluid gait pattern.    Baseline  -6 to 104    Time  3    Period  Weeks    Status  New    Target Date  04/23/18      PT SHORT TERM GOAL #2   Title  Patient will exhibit  lt leg MMT > or = to 4-/5 to exhibit greater functional strength for daily activities.    Baseline  lt hip extension and knee flexion <4-/5    Time  3    Period  Weeks    Status  New    Target Date  04/23/18      PT SHORT TERM GOAL #3   Title  Patient will be able to sit comfortably for 1 hour so she may be able to go out and eat a meal with her spouse.     Baseline  30 minutes    Time  3    Period  Weeks    Status  New    Target Date  04/23/18      PT SHORT TERM GOAL #4   Title  Patient will be able to stand for 30 minutes so she  can stand to have a conversation with a friend/family member.    Baseline  20 minutes    Time  3    Period  Weeks    Status  New    Target Date  04/23/18      PT SHORT TERM GOAL #5   Title  Patient's FOTO score will improved by 5 points to reduced overall functional limitations.    Baseline  51% limited    Time  3    Period  Weeks    Status  New    Target Date  04/23/18        PT Long Term Goals - 04/02/18 1602      PT LONG TERM GOAL #1   Title  Patient will be able to sit comfortably for 2 hours to improve her ability to return to work sooner.    Baseline  30 minutes    Time  6    Period  Weeks    Status  New    Target Date  05/14/18      PT LONG TERM GOAL #2   Title  Patient will be able to ascend and descend the stairs to her laundry room comfortably so she can keep up with her housework.    Baseline  descending difficult and feels unsafe    Time  6    Period  Weeks    Status  New    Target Date  05/14/18      PT LONG TERM GOAL #3   Title  Patient's FOTO score will improved by10 points to exhibit reduced overall functional limitations.    Baseline  51% limited    Time  6    Period  Weeks    Status  New    Target Date  05/14/18             Plan - 04/02/18 1547    Clinical Impression Statement  Pt is a 19 yr olf female presenting to OPPT after a Lt TKR 03/08/2018. She has received HHPT and continues those exercises at home. Pt overall performing well but does have functional limitations, impaired gait, decreased lt knee ROM, decreased strength, impaired skin integrity from surgical incision, and visual swelling She is able to ambulated household distance without assistive device and completed 412 feet in clinic today without increased c/o pain but with gait impairments. Pt will benefit from continued skilled OPPT to improve the aforementioned deficits.    History and Personal Factors relevant to plan of care:  DM, Bell's Palsy, under care of rheumatoid physician     Clinical Presentation  due to:  FOTO, MMT, AROM, gait, skin integrity, swelling    Clinical Decision Making  Low    Rehab Potential  Good    PT Frequency  2x / week    PT Duration  6 weeks    PT Treatment/Interventions  Biofeedback;Cryotherapy;Electrical Stimulation;DME Instruction;Ultrasound;Moist Heat;Iontophoresis 4mg /ml Dexamethasone;Gait training;Stair training;Functional mobility training;Therapeutic activities;Therapeutic exercise;Balance training;Patient/family education;Neuromuscular re-education;Manual techniques;Manual lymph drainage;Scar mobilization;Passive range of motion;Dry needling;Energy conservation;Taping    PT Next Visit Plan  assess SLS; review goals; initiate CKC exercises for ROM, balance and strengthening of lt knee post TKR; review/advance HHPT HEP    PT Home Exercise Plan  initial - reviewed HHPT HEP    Consulted and Agree with Plan of Care  Patient       Patient will benefit from skilled therapeutic intervention in order to improve the following deficits and impairments:  Abnormal gait, Decreased skin integrity, Pain, Decreased scar mobility, Decreased activity tolerance, Decreased range of motion, Decreased strength, Decreased balance, Difficulty walking, Increased edema  Visit Diagnosis: Muscle weakness (generalized) - Plan: PT plan of care cert/re-cert  Other abnormalities of gait and mobility - Plan: PT plan of care cert/re-cert  Difficulty in walking, not elsewhere classified - Plan: PT plan of care cert/re-cert  Acute pain of left knee - Plan: PT plan of care cert/re-cert     Problem List Patient Active Problem List   Diagnosis Date Noted  . Facial paralysis/Bells palsy 01/27/2014  . Facial droop 01/26/2014  . Facial weakness 01/26/2014  . Bloating 09/30/2013  . Loss of weight 09/30/2013  . Abdominal pain, other specified site 09/30/2013  . Abdominal pain, epigastric 02/10/2011  . Henoch-Schonlein purpura (HCC) 02/10/2011  . Diabetes mellitus  without mention of complication 02/10/2011    Katina DungBarbara D. Hartnett-Rands, MS, PT Per Ladoris GeneDiem PT [id number] 04/02/2018, 4:58 PM  [facility] [place] [facility] [address] [place], [place], [address] Phone: [phone]   Fax:  [phone]  Name: Victoria Williams MRN: [id number] Date of Birth: [birth year]

## 2018-04-04 ENCOUNTER — Encounter (HOSPITAL_COMMUNITY): Payer: Self-pay

## 2018-04-04 ENCOUNTER — Ambulatory Visit (HOSPITAL_COMMUNITY): Payer: BLUE CROSS/BLUE SHIELD

## 2018-04-04 DIAGNOSIS — R2689 Other abnormalities of gait and mobility: Secondary | ICD-10-CM | POA: Diagnosis not present

## 2018-04-04 DIAGNOSIS — R262 Difficulty in walking, not elsewhere classified: Secondary | ICD-10-CM | POA: Diagnosis not present

## 2018-04-04 DIAGNOSIS — M6281 Muscle weakness (generalized): Secondary | ICD-10-CM

## 2018-04-04 DIAGNOSIS — M25562 Pain in left knee: Secondary | ICD-10-CM

## 2018-04-04 NOTE — Patient Instructions (Signed)
Hamstring: Towel Stretch (Supine)    Lie on back. Loop towel around left foot, hip and knee at 90. Straighten knee and pull foot toward body. Hold 30 seconds. Relax. Repeat 3  times. Do 2 times a day. Repeat with other leg.  Copyright  VHI. All rights reserved.   Straight Leg Raise    Tighten stomach and slowly raise locked right leg ____ inches from floor. Repeat 10-15 times per set. Do 2 sets per session.  http://orth.exer.us/1103   Copyright  VHI. All rights reserved.

## 2018-04-04 NOTE — Therapy (Signed)
[facility] [address] [place], [place], [address] Phone: [phone]   Fax:  [phone]  Physical Therapy Treatment  Patient Details  Name: Victoria Williams MRN: [id number] Date of Birth: 09/12/1966 Referring Provider: Roda Shuttersalvin Maxwell McCabe   Encounter Date: 04/04/2018  PT End of Session - 04/04/18 1521    Visit Number  2    Number of Visits  12    Date for PT Re-Evaluation  05/14/18 Minireassess 04/23/18    Authorization Type  BCBS    Authorization Time Period  04/02/18-05/14/18    Authorization - Visit Number  2    Authorization - Number of Visits  53 7 out of 60 utilized    PT Start Time  1518    PT Stop Time  1600    PT Time Calculation (min)  42 min    Activity Tolerance  Patient tolerated treatment well;No increased pain    Behavior During Therapy  WFL for tasks assessed/performed       Past Medical History:  Diagnosis Date  . Arthritis   . Chronic kidney disease   . Diabetes mellitus    only when on steriods  . Esophagitis 2012   EGD  . Henoch-Schonlein purpura (HCC)   . History of kidney stones   . History of UTI   . Hypertension   . IBS (irritable bowel syndrome)   . Obesity     Past Surgical History:  Procedure Laterality Date  . ABDOMINAL HYSTERECTOMY    . ACHILLES TENDON SURGERY    . CESAREAN SECTION    . KNEE SURGERY    . NECK SURGERY     cervical fusion 6/7  . TONSILECTOMY, ADENOIDECTOMY, BILATERAL MYRINGOTOMY AND TUBES    . TONSILLECTOMY    . TUBAL LIGATION      There were no vitals filed for this visit.  Subjective Assessment - 04/04/18 1519    Subjective  Pt stated she has increased pain today, achey pain scale 5/10.      Patient Stated Goals  To get as much ability back out of Lt knee as possible over 90% hopefully    Currently in Pain?  Yes    Pain Score  5     Pain Location  Knee    Pain Orientation  Left    Pain Descriptors / Indicators  Aching    Pain Onset  1 to 4 weeks ago    Pain  Frequency  Intermittent    Aggravating Factors   sitting, standing, walking, lifting, inactivity or too much activity    Pain Relieving Factors  changing positions; using ice machine    Effect of Pain on Daily Activities  limits ability to do things like laundry because of having to go downstairs; limits ability to get in and out of high side tub         [facility] PT Assessment - 04/04/18 0001      Assessment   Medical Diagnosis  Lt TKR     Referring Provider  Caryl Adaalvin Maxwell McCabe    Onset Date/Surgical Date  03/08/18    Next MD Visit  04/10/18    Prior Therapy  HHPT                   [facility] Adult PT Treatment/Exercise - 04/04/18 0001      Exercises   Exercises  Knee/Hip      Knee/Hip Exercises: Stretches   Active Hamstring Stretch  Left;3  reps;30 seconds    Active Hamstring Stretch Limitations  supine    Knee: Self-Stretch to increase Flexion  Left;10 seconds Knee drive on 16XW step 96E 10"    Knee: Self-Stretch Limitations  Knee drive on 45WU step 98J 10"    Gastroc Stretch  3 reps;30 seconds    Gastroc Stretch Limitations  slant board      Knee/Hip Exercises: Standing   Heel Raises  15 reps    Heel Raises Limitations  Toe raises 15x    Rocker Board  2 minutes    Rocker Board Limitations  lateral/ DF/PF    SLS  Rt: 10, 26, 40"; Lt 13, 21, 26"    Gait Training  226ft cueing for heel to toe mechanics and equal stride length      Knee/Hip Exercises: Supine   Quad Sets  15 reps;Limitations    Quad Sets Limitations  reviewed HHPT HEP    Short Arc [employer]  15 reps    Short Arc Quad Sets Limitations  5" holds with ankle pumps    Heel Slides  15 reps    Heel Slides Limitations  HHPT HEP    Straight Leg Raises  10 reps    Straight Leg Raises Limitations  quad set prior lift    Knee Extension  AROM    Knee Extension Limitations  6    Knee Flexion  AROM    Knee Flexion Limitations  107    Other Supine Knee/Hip Exercises  ankle pumps 10x each (HHPT HEP)               PT Education - 04/04/18 1540    Education Details  Reviewed goals, assessed compliance/form with HHPT HEP, copy of eval given to pt.  Reviewed RICE application and time for pain and edema control    Person(s) Educated  Patient    Methods  Explanation;Demonstration;Handout    Comprehension  Verbalized understanding;Returned demonstration       PT Short Term Goals - 04/02/18 1555      PT SHORT TERM GOAL #1   Title  Pt will exhibit lt knee flexion to 110 degrees and extension to -3 degrees to improve ability to ascend and descend stairs with a more fluid gait pattern.    Baseline  -6 to 104    Time  3    Period  Weeks    Status  New    Target Date  04/23/18      PT SHORT TERM GOAL #2   Title  Patient will exhibit lt leg MMT > or = to 4-/5 to exhibit greater functional strength for daily activities.    Baseline  lt hip extension and knee flexion <4-/5    Time  3    Period  Weeks    Status  New    Target Date  04/23/18      PT SHORT TERM GOAL #3   Title  Patient will be able to sit comfortably for 1 hour so she may be able to go out and eat a meal with her spouse.     Baseline  30 minutes    Time  3    Period  Weeks    Status  New    Target Date  04/23/18      PT SHORT TERM GOAL #4   Title  Patient will be able to stand for 30 minutes so she can stand to have a conversation with a friend/family member.  Baseline  20 minutes    Time  3    Period  Weeks    Status  New    Target Date  04/23/18      PT SHORT TERM GOAL #5   Title  Patient's FOTO score will improved by 5 points to reduced overall functional limitations.    Baseline  51% limited    Time  3    Period  Weeks    Status  New    Target Date  04/23/18        PT Long Term Goals - 04/02/18 1602      PT LONG TERM GOAL #1   Title  Patient will be able to sit comfortably for 2 hours to improve her ability to return to work sooner.    Baseline  30 minutes    Time  6    Period  Weeks    Status   New    Target Date  05/14/18      PT LONG TERM GOAL #2   Title  Patient will be able to ascend and descend the stairs to her laundry room comfortably so she can keep up with her housework.    Baseline  descending difficult and feels unsafe    Time  6    Period  Weeks    Status  New    Target Date  05/14/18      PT LONG TERM GOAL #3   Title  Patient's FOTO score will improved by10 points to exhibit reduced overall functional limitations.    Baseline  51% limited    Time  6    Period  Weeks    Status  New    Target Date  05/14/18            Plan - 04/04/18 1530    Clinical Impression Statement  Reviewed goals, reviewed/assured compliance with HHPT HEP and copy of eval given to pt.  Session focus on knee mobility and improving stance phase wiht gait mechanics.  Added quad strengthening exercises and stretches to improve ROM as well as rockerboard to improve weight distribution with gait.  EOS pt presents with improved gait mechanics and no reports of increased pain through session.  Reviewed RICE techniques to address pain and edema control following session.      Rehab Potential  Good    PT Frequency  2x / week    PT Duration  6 weeks    PT Treatment/Interventions  Biofeedback;Cryotherapy;Electrical Stimulation;DME Instruction;Ultrasound;Moist Heat;Iontophoresis 4mg /ml Dexamethasone;Gait training;Stair training;Functional mobility training;Therapeutic activities;Therapeutic exercise;Balance training;Patient/family education;Neuromuscular re-education;Manual techniques;Manual lymph drainage;Scar mobilization;Passive range of motion;Dry needling;Energy conservation;Taping    PT Next Visit Plan  Add standing TKE next session.  Focus on ROM progress balance and strengthening as appropriate.    PT Home Exercise Plan  HHPT HEP: quad sets, heel slides, ankle pumps, standing hamstring curl and heel raises; 6/12: hamstring stretch and SLR       Patient will benefit from skilled therapeutic  intervention in order to improve the following deficits and impairments:  Abnormal gait, Decreased skin integrity, Pain, Decreased scar mobility, Decreased activity tolerance, Decreased range of motion, Decreased strength, Decreased balance, Difficulty walking, Increased edema  Visit Diagnosis: Muscle weakness (generalized)  Other abnormalities of gait and mobility  Difficulty in walking, not elsewhere classified  Acute pain of left knee     Problem List Patient Active Problem List   Diagnosis Date Noted  . Facial paralysis/Bells palsy 01/27/2014  . Facial  droop 01/26/2014  . Facial weakness 01/26/2014  . Bloating 09/30/2013  . Loss of weight 09/30/2013  . Abdominal pain, other specified site 09/30/2013  . Abdominal pain, epigastric 02/10/2011  . Henoch-Schonlein purpura (HCC) 02/10/2011  . Diabetes mellitus without mention of complication 02/10/2011   Becky Sax, LPTA; CBIS [phone]  Juel Burrow 04/04/2018, 4:14 PM  [facility] [address] [place], [place], [address] Phone: [phone]   Fax:  [phone]  Name: Victoria Williams MRN: [id number] Date of Birth: 1966-05-09

## 2018-04-09 DIAGNOSIS — Z6839 Body mass index (BMI) 39.0-39.9, adult: Secondary | ICD-10-CM | POA: Diagnosis not present

## 2018-04-09 DIAGNOSIS — N390 Urinary tract infection, site not specified: Secondary | ICD-10-CM | POA: Diagnosis not present

## 2018-04-09 DIAGNOSIS — R300 Dysuria: Secondary | ICD-10-CM | POA: Diagnosis not present

## 2018-04-09 DIAGNOSIS — Z1389 Encounter for screening for other disorder: Secondary | ICD-10-CM | POA: Diagnosis not present

## 2018-04-09 DIAGNOSIS — Z0001 Encounter for general adult medical examination with abnormal findings: Secondary | ICD-10-CM | POA: Diagnosis not present

## 2018-04-09 DIAGNOSIS — G510 Bell's palsy: Secondary | ICD-10-CM | POA: Diagnosis not present

## 2018-04-09 DIAGNOSIS — D690 Allergic purpura: Secondary | ICD-10-CM | POA: Diagnosis not present

## 2018-04-09 DIAGNOSIS — R350 Frequency of micturition: Secondary | ICD-10-CM | POA: Diagnosis not present

## 2018-04-09 DIAGNOSIS — I10 Essential (primary) hypertension: Secondary | ICD-10-CM | POA: Diagnosis not present

## 2018-04-10 DIAGNOSIS — Z96652 Presence of left artificial knee joint: Secondary | ICD-10-CM | POA: Diagnosis not present

## 2018-04-10 DIAGNOSIS — M7989 Other specified soft tissue disorders: Secondary | ICD-10-CM | POA: Diagnosis not present

## 2018-04-10 DIAGNOSIS — M25462 Effusion, left knee: Secondary | ICD-10-CM | POA: Diagnosis not present

## 2018-04-11 ENCOUNTER — Encounter (HOSPITAL_COMMUNITY): Payer: Self-pay

## 2018-04-11 ENCOUNTER — Ambulatory Visit (HOSPITAL_COMMUNITY): Payer: BLUE CROSS/BLUE SHIELD

## 2018-04-11 DIAGNOSIS — R2689 Other abnormalities of gait and mobility: Secondary | ICD-10-CM

## 2018-04-11 DIAGNOSIS — R262 Difficulty in walking, not elsewhere classified: Secondary | ICD-10-CM | POA: Diagnosis not present

## 2018-04-11 DIAGNOSIS — M25562 Pain in left knee: Secondary | ICD-10-CM | POA: Diagnosis not present

## 2018-04-11 DIAGNOSIS — M6281 Muscle weakness (generalized): Secondary | ICD-10-CM

## 2018-04-11 NOTE — Therapy (Signed)
[facility] [address] [place], [place], [address] Phone: [phone]   Fax:  [phone]  Physical Therapy Treatment  Patient Details  Name: Victoria Williams MRN: [id number] Date of Birth: April 10, 1966 Referring Provider: Roda Shutters   Encounter Date: 04/11/2018  PT End of Session - 04/11/18 1208    Visit Number  3    Number of Visits  12    Date for PT Re-Evaluation  05/14/18 MIni-reassess 04/23/18    Authorization Type  BCBS    Authorization Time Period  04/02/18-05/14/18    Authorization - Visit Number  3    Authorization - Number of Visits  53 7 out of 60 utilized with HHPT    PT Start Time  1120    PT Stop Time  1159    PT Time Calculation (min)  39 min    Activity Tolerance  Patient tolerated treatment well;No increased pain    Behavior During Therapy  WFL for tasks assessed/performed       Past Medical History:  Diagnosis Date  . Arthritis   . Chronic kidney disease   . Diabetes mellitus    only when on steriods  . Esophagitis 2012   EGD  . Henoch-Schonlein purpura (HCC)   . History of kidney stones   . History of UTI   . Hypertension   . IBS (irritable bowel syndrome)   . Obesity     Past Surgical History:  Procedure Laterality Date  . ABDOMINAL HYSTERECTOMY    . ACHILLES TENDON SURGERY    . CESAREAN SECTION    . KNEE SURGERY    . NECK SURGERY     cervical fusion 6/7  . TONSILECTOMY, ADENOIDECTOMY, BILATERAL MYRINGOTOMY AND TUBES    . TONSILLECTOMY    . TUBAL LIGATION      There were no vitals filed for this visit.  Subjective Assessment - 04/11/18 1122    Subjective  Pt stated she has increased pain today, has a flood in basement yesterday using vaccum.  Stated MD was happy with progress yesterday, wishes to continues PT for 6 more weeks with increased focus on knee extension.  Pt with UTI, on medication for.  Reports she went to church for the first time following surgery, knee stiff following 1  hour drive to church    Pertinent History  C-spine fusion, Lt calf surgery for cyst removal, Lt posterior thigh numbness - workup pending for lt hip and Low back, Bell's Palsy, DM    Patient Stated Goals  To get as much ability back out of Lt knee as possible over 90% hopefully    Currently in Pain?  Yes    Pain Score  6     Pain Location  Knee    Pain Orientation  Left    Pain Descriptors / Indicators  Aching;Dull;Sharp;Sore    Pain Type  Surgical pain    Pain Onset  1 to 4 weeks ago    Pain Frequency  Intermittent    Aggravating Factors   sitting, standing, walking, lifting, inactivity or too much activity    Pain Relieving Factors  changing positions; using ice machine    Effect of Pain on Daily Activities  limits ability to do things like laundry because of having to go downstairs; limits ability to get in and out of high side tub  [facility] Adult PT Treatment/Exercise - 04/11/18 0001      Knee/Hip Exercises: Stretches   Active Hamstring Stretch  Left;3 reps;30 seconds    Active Hamstring Stretch Limitations  12in step    Knee: Self-Stretch to increase Flexion  Left;10 seconds    Knee: Self-Stretch Limitations  Knee drive on 16XW12in step 96E10x 10"    Gastroc Stretch  3 reps;30 seconds    Gastroc Stretch Limitations  slant board      Knee/Hip Exercises: Standing   Heel Raises  15 reps    Heel Raises Limitations  Toe raises 15x    Terminal Knee Extension  Left;10 reps;Theraband    Theraband Level (Terminal Knee Extension)  Level 2 (Red)    Rocker Board  2 minutes    Rocker Board Limitations  lateral/ DF/PF    Gait Training  24526ft cueing for heel to toe mechanics and equal stride length      Knee/Hip Exercises: Supine   Quad Sets  10 reps    Short Arc [employer] Sets  15 reps    Short Arc Quad Sets Limitations  5" hold with ankle pumps    Heel Slides  15 reps    Straight Leg Raises  10 reps    Straight Leg Raises Limitations  quad set prior lift     Knee Extension  AROM    Knee Extension Limitations  6    Knee Flexion  AROM    Knee Flexion Limitations  115 was 107 last session               PT Short Term Goals - 04/02/18 1555      PT SHORT TERM GOAL #1   Title  Pt will exhibit lt knee flexion to 110 degrees and extension to -3 degrees to improve ability to ascend and descend stairs with a more fluid gait pattern.    Baseline  -6 to 104    Time  3    Period  Weeks    Status  New    Target Date  04/23/18      PT SHORT TERM GOAL #2   Title  Patient will exhibit lt leg MMT > or = to 4-/5 to exhibit greater functional strength for daily activities.    Baseline  lt hip extension and knee flexion <4-/5    Time  3    Period  Weeks    Status  New    Target Date  04/23/18      PT SHORT TERM GOAL #3   Title  Patient will be able to sit comfortably for 1 hour so she may be able to go out and eat a meal with her spouse.     Baseline  30 minutes    Time  3    Period  Weeks    Status  New    Target Date  04/23/18      PT SHORT TERM GOAL #4   Title  Patient will be able to stand for 30 minutes so she can stand to have a conversation with a friend/family member.    Baseline  20 minutes    Time  3    Period  Weeks    Status  New    Target Date  04/23/18      PT SHORT TERM GOAL #5   Title  Patient's FOTO score will improved by 5 points to reduced overall functional limitations.    Baseline  51% limited  Time  3    Period  Weeks    Status  New    Target Date  04/23/18        PT Long Term Goals - 04/02/18 1602      PT LONG TERM GOAL #1   Title  Patient will be able to sit comfortably for 2 hours to improve her ability to return to work sooner.    Baseline  30 minutes    Time  6    Period  Weeks    Status  New    Target Date  05/14/18      PT LONG TERM GOAL #2   Title  Patient will be able to ascend and descend the stairs to her laundry room comfortably so she can keep up with her housework.    Baseline   descending difficult and feels unsafe    Time  6    Period  Weeks    Status  New    Target Date  05/14/18      PT LONG TERM GOAL #3   Title  Patient's FOTO score will improved by10 points to exhibit reduced overall functional limitations.    Baseline  51% limited    Time  6    Period  Weeks    Status  New    Target Date  05/14/18            Plan - 04/11/18 1208    Clinical Impression Statement  Session focus with knee mobility.  Added standing TKE and continued stretches to improve range and reduce pain.  Improved AROM 6-115 degrees (was 6-107 degrees last session).  No reports of increased pain through session.    Rehab Potential  Good    PT Frequency  2x / week    PT Duration  6 weeks    PT Treatment/Interventions  Biofeedback;Cryotherapy;Electrical Stimulation;DME Instruction;Ultrasound;Moist Heat;Iontophoresis 4mg /ml Dexamethasone;Gait training;Stair training;Functional mobility training;Therapeutic activities;Therapeutic exercise;Balance training;Patient/family education;Neuromuscular re-education;Manual techniques;Manual lymph drainage;Scar mobilization;Passive range of motion;Dry needling;Energy conservation;Taping    PT Next Visit Plan  Focus on knee extension > flexion.  Progress balance training and strengthening as appropriate.  Next session trial prone knee hang, begin STS and resume SLS/tandem stance.      PT Home Exercise Plan  HHPT HEP: quad sets, heel slides, ankle pumps, standing hamstring curl and heel raises; 6/12: hamstring stretch and SLR       Patient will benefit from skilled therapeutic intervention in order to improve the following deficits and impairments:  Abnormal gait, Decreased skin integrity, Pain, Decreased scar mobility, Decreased activity tolerance, Decreased range of motion, Decreased strength, Decreased balance, Difficulty walking, Increased edema  Visit Diagnosis: Muscle weakness (generalized)  Other abnormalities of gait and  mobility  Difficulty in walking, not elsewhere classified  Acute pain of left knee     Problem List Patient Active Problem List   Diagnosis Date Noted  . Facial paralysis/Bells palsy 01/27/2014  . Facial droop 01/26/2014  . Facial weakness 01/26/2014  . Bloating 09/30/2013  . Loss of weight 09/30/2013  . Abdominal pain, other specified site 09/30/2013  . Abdominal pain, epigastric 02/10/2011  . Henoch-Schonlein purpura (HCC) 02/10/2011  . Diabetes mellitus without mention of complication 02/10/2011   Becky Sax, LPTA; CBIS [phone]  Juel Burrow 04/11/2018, 12:11 PM  [facility] [address] [place], [place], [address] Phone: [phone]   Fax:  [phone]  Name: SHERRYN POLLINO MRN: [id number] Date of Birth: 02-Jul-1966

## 2018-04-13 ENCOUNTER — Ambulatory Visit (HOSPITAL_COMMUNITY): Payer: BLUE CROSS/BLUE SHIELD

## 2018-04-13 DIAGNOSIS — M25562 Pain in left knee: Secondary | ICD-10-CM

## 2018-04-13 DIAGNOSIS — R2689 Other abnormalities of gait and mobility: Secondary | ICD-10-CM

## 2018-04-13 DIAGNOSIS — R262 Difficulty in walking, not elsewhere classified: Secondary | ICD-10-CM

## 2018-04-13 DIAGNOSIS — M6281 Muscle weakness (generalized): Secondary | ICD-10-CM

## 2018-04-13 NOTE — Therapy (Signed)
[facility] [address] [place], [place], [address] Phone: [phone]   Fax:  [phone]  Physical Therapy Treatment  Patient Details  Name: Victoria Williams MRN: [id number] Date of Birth: 1966/10/04 Referring Provider: Roda Shutters   Encounter Date: 04/13/2018  PT End of Session - 04/13/18 1303    Visit Number  4    Number of Visits  12    Date for PT Re-Evaluation  05/14/18 MIni-reassess 04/23/18    Authorization Type  BCBS    Authorization Time Period  04/02/18-05/14/18    Authorization - Visit Number  4    Authorization - Number of Visits  53 7 out of 60 utilized with HHPT    PT Start Time  1302    PT Stop Time  1341    PT Time Calculation (min)  39 min    Activity Tolerance  Patient tolerated treatment well;No increased pain    Behavior During Therapy  WFL for tasks assessed/performed       Past Medical History:  Diagnosis Date  . Arthritis   . Chronic kidney disease   . Diabetes mellitus    only when on steriods  . Esophagitis 2012   EGD  . Henoch-Schonlein purpura (HCC)   . History of kidney stones   . History of UTI   . Hypertension   . IBS (irritable bowel syndrome)   . Obesity     Past Surgical History:  Procedure Laterality Date  . ABDOMINAL HYSTERECTOMY    . ACHILLES TENDON SURGERY    . CESAREAN SECTION    . KNEE SURGERY    . NECK SURGERY     cervical fusion 6/7  . TONSILECTOMY, ADENOIDECTOMY, BILATERAL MYRINGOTOMY AND TUBES    . TONSILLECTOMY    . TUBAL LIGATION      There were no vitals filed for this visit.  Subjective Assessment - 04/13/18 1304    Subjective  Pt states that her knee is feeling good today, about a 2/10.     Pertinent History  C-spine fusion, Lt calf surgery for cyst removal, Lt posterior thigh numbness - workup pending for lt hip and Low back, Bell's Palsy, DM    Patient Stated Goals  To get as much ability back out of Lt knee as possible over 90% hopefully    Currently  in Pain?  Yes    Pain Score  2     Pain Location  Knee    Pain Orientation  Left    Pain Descriptors / Indicators  Aching;Dull;Sore    Pain Type  Surgical pain    Pain Onset  1 to 4 weeks ago    Pain Frequency  Intermittent    Aggravating Factors   sitting, standing, walking, lifting, inactivity or too much activity    Pain Relieving Factors  changing positions, using ice machine    Effect of Pain on Daily Activities  limits ability to do things like laundry because of having to go downstairs; limits ability to get in and out of high side tub            [facility] Adult PT Treatment/Exercise - 04/13/18 0001      Knee/Hip Exercises: Stretches   Active Hamstring Stretch  Left;3 reps;30 seconds    Active Hamstring Stretch Limitations  12in step    Knee: Self-Stretch to increase Flexion  Left;10 seconds    Knee: Self-Stretch Limitations  Knee drive on 69GE step 95M 10"  Gastroc Stretch  3 reps;30 seconds    Gastroc Stretch Limitations  slant board      Knee/Hip Exercises: Standing   Heel Raises  20 reps    Heel Raises Limitations  heel and toe    Knee Flexion  Left;15 reps    Terminal Knee Extension  Left;15 reps;Theraband;Limitations    Theraband Level (Terminal Knee Extension)  Level 2 (Red)    Terminal Knee Extension Limitations  10" holds    Lateral Step Up  Right;2 sets;10 reps;Step Height: 4"    Stairs  x4RT, able to ascend reciprocally but step-to descent    Rocker Board  2 minutes    Rocker Board Limitations  R/L      Knee/Hip Exercises: Seated   Long Arc Quad  Left;15 reps    Sit to [employer]  15 reps;without UE support      Knee/Hip Exercises: Supine   Knee Extension Limitations  0    Knee Flexion Limitations  111      Knee/Hip Exercises: Prone   Hamstring Curl  15 reps;Limitations    Prone Knee Hang  4 minutes            PT Education - 04/13/18 1303    Education Details  exercise technique, continue HEP    Person(s) Educated  Patient    Methods   Explanation;Demonstration    Comprehension  Verbalized understanding;Returned demonstration       PT Short Term Goals - 04/02/18 1555      PT SHORT TERM GOAL #1   Title  Pt will exhibit lt knee flexion to 110 degrees and extension to -3 degrees to improve ability to ascend and descend stairs with a more fluid gait pattern.    Baseline  -6 to 104    Time  3    Period  Weeks    Status  New    Target Date  04/23/18      PT SHORT TERM GOAL #2   Title  Patient will exhibit lt leg MMT > or = to 4-/5 to exhibit greater functional strength for daily activities.    Baseline  lt hip extension and knee flexion <4-/5    Time  3    Period  Weeks    Status  New    Target Date  04/23/18      PT SHORT TERM GOAL #3   Title  Patient will be able to sit comfortably for 1 hour so she may be able to go out and eat a meal with her spouse.     Baseline  30 minutes    Time  3    Period  Weeks    Status  New    Target Date  04/23/18      PT SHORT TERM GOAL #4   Title  Patient will be able to stand for 30 minutes so she can stand to have a conversation with a friend/family member.    Baseline  20 minutes    Time  3    Period  Weeks    Status  New    Target Date  04/23/18      PT SHORT TERM GOAL #5   Title  Patient's FOTO score will improved by 5 points to reduced overall functional limitations.    Baseline  51% limited    Time  3    Period  Weeks    Status  New    Target Date  04/23/18  PT Long Term Goals - 04/02/18 1602      PT LONG TERM GOAL #1   Title  Patient will be able to sit comfortably for 2 hours to improve her ability to return to work sooner.    Baseline  30 minutes    Time  6    Period  Weeks    Status  New    Target Date  05/14/18      PT LONG TERM GOAL #2   Title  Patient will be able to ascend and descend the stairs to her laundry room comfortably so she can keep up with her housework.    Baseline  descending difficult and feels unsafe    Time  6    Period   Weeks    Status  New    Target Date  05/14/18      PT LONG TERM GOAL #3   Title  Patient's FOTO score will improved by10 points to exhibit reduced overall functional limitations.    Baseline  51% limited    Time  6    Period  Weeks    Status  New    Target Date  05/14/18            Plan - 04/13/18 1340    Clinical Impression Statement  Continued with established POC focusing on knee mobility, extension > flexion, functional strengthening, and balance. Pt tolerating session well, not reporting any pain during therex. Continued with TKE for improved quad strengthening in order to maximize knee extension ROM. Added prone knee hang for extension. Able to add knee flexion strengthening exercises with no issue. AROM 0 to 111deg this date, a significant improvement in extension ROM. Stairs and inability to paint her L toenails are her biggest complaints, both of which are due to range and stairs are also limited due to weakness. Continue as planned, progressing as able.     Rehab Potential  Good    PT Frequency  2x / week    PT Duration  6 weeks    PT Treatment/Interventions  Biofeedback;Cryotherapy;Electrical Stimulation;DME Instruction;Ultrasound;Moist Heat;Iontophoresis 4mg /ml Dexamethasone;Gait training;Stair training;Functional mobility training;Therapeutic activities;Therapeutic exercise;Balance training;Patient/family education;Neuromuscular re-education;Manual techniques;Manual lymph drainage;Scar mobilization;Passive range of motion;Dry needling;Energy conservation;Taping    PT Next Visit Plan  continue focus on knee ROM; add prone quad stretch; Progress balance training and strengthening as appropriate.  continue prone knee hang as needed, begin STS and resume SLS/tandem stance.      PT Home Exercise Plan  HHPT HEP: quad sets, heel slides, ankle pumps, standing hamstring curl and heel raises; 6/12: hamstring stretch and SLR    Consulted and Agree with Plan of Care  Patient        Patient will benefit from skilled therapeutic intervention in order to improve the following deficits and impairments:  Abnormal gait, Decreased skin integrity, Pain, Decreased scar mobility, Decreased activity tolerance, Decreased range of motion, Decreased strength, Decreased balance, Difficulty walking, Increased edema  Visit Diagnosis: Muscle weakness (generalized)  Other abnormalities of gait and mobility  Difficulty in walking, not elsewhere classified  Acute pain of left knee     Problem List Patient Active Problem List   Diagnosis Date Noted  . Facial paralysis/Bells palsy 01/27/2014  . Facial droop 01/26/2014  . Facial weakness 01/26/2014  . Bloating 09/30/2013  . Loss of weight 09/30/2013  . Abdominal pain, other specified site 09/30/2013  . Abdominal pain, epigastric 02/10/2011  . Henoch-Schonlein purpura (HCC) 02/10/2011  . Diabetes mellitus without mention  of complication 02/10/2011       Jac CanavanBrooke Powell PT, DPT  [facility] [address] [place], [place], [address] Phone: [phone]   Fax:  [phone]  Name: Gerline LegacyMia M Sacco MRN: [id number] Date of Birth: November 18, 1965

## 2018-04-16 ENCOUNTER — Ambulatory Visit (HOSPITAL_COMMUNITY): Payer: BLUE CROSS/BLUE SHIELD | Admitting: Physical Therapy

## 2018-04-16 DIAGNOSIS — M6281 Muscle weakness (generalized): Secondary | ICD-10-CM

## 2018-04-16 DIAGNOSIS — R262 Difficulty in walking, not elsewhere classified: Secondary | ICD-10-CM

## 2018-04-16 DIAGNOSIS — R2689 Other abnormalities of gait and mobility: Secondary | ICD-10-CM

## 2018-04-16 DIAGNOSIS — M25562 Pain in left knee: Secondary | ICD-10-CM

## 2018-04-16 NOTE — Therapy (Addendum)
[facility] [address] [place], [place], [address] Phone: [phone]   Fax:  [phone]  Physical Therapy Treatment  Patient Details  Name: Victoria LegacyMia M Schwier MRN: [id number] Date of Birth: 06-09-1966 Referring Provider: Roda Shuttersalvin Maxwell McCabe   Encounter Date: 04/16/2018  PT End of Session - 04/16/18 1251    Visit Number  5    Number of Visits  12    Date for PT Re-Evaluation  05/14/18 MIni-reassess 04/23/18    Authorization Type  BCBS    Authorization Time Period  04/02/18-05/14/18    Authorization - Visit Number  5    Authorization - Number of Visits  53 7 out of 60 utilized with HHPT    PT Start Time  1118    PT Stop Time  1200    PT Time Calculation (min)  42 min    Activity Tolerance  Patient tolerated treatment well;No increased pain    Behavior During Therapy  WFL for tasks assessed/performed       Past Medical History:  Diagnosis Date  . Arthritis   . Chronic kidney disease   . Diabetes mellitus    only when on steriods  . Esophagitis 2012   EGD  . Henoch-Schonlein purpura (HCC)   . History of kidney stones   . History of UTI   . Hypertension   . IBS (irritable bowel syndrome)   . Obesity     Past Surgical History:  Procedure Laterality Date  . ABDOMINAL HYSTERECTOMY    . ACHILLES TENDON SURGERY    . CESAREAN SECTION    . KNEE SURGERY    . NECK SURGERY     cervical fusion 6/7  . TONSILECTOMY, ADENOIDECTOMY, BILATERAL MYRINGOTOMY AND TUBES    . TONSILLECTOMY    . TUBAL LIGATION      There were no vitals filed for this visit.  Subjective Assessment - 04/16/18 0001    Subjective  Pt states that her knee  is feeling good today, about a 2/10.     Pertinent History  C-spine fusion, Lt calf surgery for cyst removal, Lt posterior thigh numbness - workup pending for lt hip and Low back, Bell's Palsy, DM    Patient Stated Goals  To get as much ability back out of Lt knee as possible over 90% hopefully     Currently in Pain?  Yes    Pain Score  2     Pain Location  Knee    Pain Orientation  Left    Pain Descriptors / Indicators  Aching;Dull;Sore    Pain Type  Surgical pain    Pain Onset  1 to 4 weeks ago    Pain Frequency  Intermittent    Aggravating Factors   sitting, standing, walking, lifting, inactivity or too much activity    Pain Relieving Factors  changing positions, using ice machine    Effect of Pain on Daily Activities  limits ability to do things like laundry because of having to go downstairs; limits ability to get in and out of high side tub                           [facility] Adult PT Treatment/Exercise - 04/16/18 0001      Knee/Hip Exercises: Stretches   Active Hamstring Stretch  Left;3 reps;30 seconds    Active Hamstring Stretch Limitations  12in step    Knee: Self-Stretch to increase Flexion  Left;10  seconds    Knee: Self-Stretch Limitations  Knee drive on 16XW step 96E 10"    Gastroc Stretch  3 reps;30 seconds    Gastroc Stretch Limitations  slant board      Knee/Hip Exercises: Standing   Heel Raises  20 reps    Heel Raises Limitations  heel and toe    Knee Flexion  Left;15 reps    Lateral Step Up  Left;10 reps;Hand Hold: 1;Step Height: 4"    Forward Step Up  Left;10 reps;Step Height: 4";Hand Hold: 1    Stairs  x2RT on 4" inch step side reciprocally with 1 HR    Gait Training  153ft cueing for heel to toe mechanics and equal stride length      Knee/Hip Exercises: Supine   Quad Sets  10 reps    Heel Slides  10 reps    Knee Extension Limitations  2    Knee Flexion Limitations  120 following manual, 115 degrees prior to manual      Knee/Hip Exercises: Prone   Hamstring Curl  15 reps;Limitations      Manual Therapy   Manual Therapy  Myofascial release    Manual therapy comments  completed seperately from all other skilled interventions    Myofascial Release  to scar and lateral aspect to decrease adhesions               PT  Short Term Goals - 04/02/18 1555      PT SHORT TERM GOAL #1   Title  Pt will exhibit lt knee flexion to 110 degrees and extension to -3 degrees to improve ability to ascend and descend stairs with a more fluid gait pattern.    Baseline  -6 to 104    Time  3    Period  Weeks    Status  New    Target Date  04/23/18      PT SHORT TERM GOAL #2   Title  Patient will exhibit lt leg MMT > or = to 4-/5 to exhibit greater functional strength for daily activities.    Baseline  lt hip extension and knee flexion <4-/5    Time  3    Period  Weeks    Status  New    Target Date  04/23/18      PT SHORT TERM GOAL #3   Title  Patient will be able to sit comfortably for 1 hour so she may be able to go out and eat a meal with her spouse.     Baseline  30 minutes    Time  3    Period  Weeks    Status  New    Target Date  04/23/18      PT SHORT TERM GOAL #4   Title  Patient will be able to stand for 30 minutes so she can stand to have a conversation with a friend/family member.    Baseline  20 minutes    Time  3    Period  Weeks    Status  New    Target Date  04/23/18      PT SHORT TERM GOAL #5   Title  Patient's FOTO score will improved by 5 points to reduced overall functional limitations.    Baseline  51% limited    Time  3    Period  Weeks    Status  New    Target Date  04/23/18        PT  Long Term Goals - 04/02/18 1602      PT LONG TERM GOAL #1   Title  Patient will be able to sit comfortably for 2 hours to improve her ability to return to work sooner.    Baseline  30 minutes    Time  6    Period  Weeks    Status  New    Target Date  05/14/18      PT LONG TERM GOAL #2   Title  Patient will be able to ascend and descend the stairs to her laundry room comfortably so she can keep up with her housework.    Baseline  descending difficult and feels unsafe    Time  6    Period  Weeks    Status  New    Target Date  05/14/18      PT LONG TERM GOAL #3   Title  Patient's FOTO  score will improved by10 points to exhibit reduced overall functional limitations.    Baseline  51% limited    Time  6    Period  Weeks    Status  New    Target Date  05/14/18            Plan - 04/16/18 1251    Clinical Impression Statement  continued with focus on improving ROM and functional strength.  Began forward step up and continued to work on Environmental [employer].  Pt able to complete on 4" steps with 1 HR without difficulty, however did not attempt 6" side.  Instructed with seated piriformis stretch as well to stretch tight hip and knee musculature.  myofascial techniques added to knee, mostly laterally to help reduce adhesions.  Able to achieve 120 degrees following myofascial (115 degrees prior to).   Distal scar and lateral knee with minor adhesions.  continued work on ambulation to promote heel to toe gait.  Pt overall progressing well.      Rehab Potential  Good    PT Frequency  2x / week    PT Duration  6 weeks    PT Treatment/Interventions  Biofeedback;Cryotherapy;Electrical Stimulation;DME Instruction;Ultrasound;Moist Heat;Iontophoresis 4mg /ml Dexamethasone;Gait training;Stair training;Functional mobility training;Therapeutic activities;Therapeutic exercise;Balance training;Patient/family education;Neuromuscular re-education;Manual techniques;Manual lymph drainage;Scar mobilization;Passive range of motion;Dry needling;Energy conservation;Taping    PT Next Visit Plan  continue focus on knee ROM; add prone quad stretch; Progress balance training and strengthening as appropriate.  continue prone knee hang as needed, begin STS and resume SLS/tandem stance.      PT Home Exercise Plan  HHPT HEP: quad sets, heel slides, ankle pumps, standing hamstring curl and heel raises; 6/12: hamstring stretch and SLR    Consulted and Agree with Plan of Care  Patient       Patient will benefit from skilled therapeutic intervention in order to improve the following deficits and  impairments:  Abnormal gait, Decreased skin integrity, Pain, Decreased scar mobility, Decreased activity tolerance, Decreased range of motion, Decreased strength, Decreased balance, Difficulty walking, Increased edema  Visit Diagnosis: Muscle weakness (generalized)  Other abnormalities of gait and mobility  Difficulty in walking, not elsewhere classified  Acute pain of left knee     Problem List Patient Active Problem List   Diagnosis Date Noted  . Facial paralysis/Bells palsy 01/27/2014  . Facial droop 01/26/2014  . Facial weakness 01/26/2014  . Bloating 09/30/2013  . Loss of weight 09/30/2013  . Abdominal pain, other specified site 09/30/2013  . Abdominal pain, epigastric 02/10/2011  . Henoch-Schonlein purpura (HCC) 02/10/2011  .  Diabetes mellitus without mention of complication 02/10/2011   Lurena Nida, PTA/CLT [id number]  Lurena Nida 04/16/2018, 1:06 PM  [facility] [address] [place], [place], [address] Phone: [phone]   Fax:  [phone]  Name: BRITTNIE LEWEY MRN: [id number] Date of Birth: Sep 15, 1966

## 2018-04-18 ENCOUNTER — Telehealth: Payer: Self-pay | Admitting: Diagnostic Neuroimaging

## 2018-04-18 ENCOUNTER — Ambulatory Visit (HOSPITAL_COMMUNITY): Payer: BLUE CROSS/BLUE SHIELD

## 2018-04-18 ENCOUNTER — Encounter: Payer: Self-pay | Admitting: *Deleted

## 2018-04-18 ENCOUNTER — Encounter (HOSPITAL_COMMUNITY): Payer: Self-pay

## 2018-04-18 ENCOUNTER — Ambulatory Visit: Payer: BLUE CROSS/BLUE SHIELD | Admitting: Diagnostic Neuroimaging

## 2018-04-18 VITALS — BP 123/77 | HR 97 | Ht 63.0 in | Wt 219.6 lb

## 2018-04-18 DIAGNOSIS — R0681 Apnea, not elsewhere classified: Secondary | ICD-10-CM

## 2018-04-18 DIAGNOSIS — M25562 Pain in left knee: Secondary | ICD-10-CM | POA: Diagnosis not present

## 2018-04-18 DIAGNOSIS — R413 Other amnesia: Secondary | ICD-10-CM

## 2018-04-18 DIAGNOSIS — R2689 Other abnormalities of gait and mobility: Secondary | ICD-10-CM | POA: Diagnosis not present

## 2018-04-18 DIAGNOSIS — R262 Difficulty in walking, not elsewhere classified: Secondary | ICD-10-CM | POA: Diagnosis not present

## 2018-04-18 DIAGNOSIS — M6281 Muscle weakness (generalized): Secondary | ICD-10-CM

## 2018-04-18 DIAGNOSIS — R0683 Snoring: Secondary | ICD-10-CM | POA: Diagnosis not present

## 2018-04-18 DIAGNOSIS — G4489 Other headache syndrome: Secondary | ICD-10-CM

## 2018-04-18 NOTE — Therapy (Signed)
[facility] [address] [place], [place], [address] Phone: [phone]   Fax:  [phone]  Physical Therapy Treatment  Patient Details  Name: Victoria Williams MRN: [id number] Date of Birth: 04-07-1966 Referring Provider: Roda Shutters   Encounter Date: 04/18/2018  PT End of Session - 04/18/18 1346    Visit Number  6    Number of Visits  12    Date for PT Re-Evaluation  05/14/18 MIni-reassess 04/23/18    Authorization Type  BCBS    Authorization Time Period  04/02/18-05/14/18    Authorization - Visit Number  6    Authorization - Number of Visits  53 7 out of 60 utilized with HHPT    PT Start Time  1303    PT Stop Time  1346    PT Time Calculation (min)  43 min    Activity Tolerance  Patient tolerated treatment well;No increased pain    Behavior During Therapy  WFL for tasks assessed/performed       Past Medical History:  Diagnosis Date  . Arthritis   . Bell's palsy    approx 2015  . Chronic kidney disease   . Diabetes mellitus    only when on steriods  . Esophagitis 2012   EGD  . Henoch-Schonlein purpura (HCC)   . History of kidney stones   . History of UTI   . Hypertension   . IBS (irritable bowel syndrome)   . Obesity     Past Surgical History:  Procedure Laterality Date  . ABDOMINAL HYSTERECTOMY  2003  . ACHILLES TENDON SURGERY    . CESAREAN SECTION    . KNEE SURGERY     arthroscopic  . NECK SURGERY  2000   cervical fusion 6/7  . REPLACEMENT TOTAL KNEE Left 2019   [facility]  . TONSILECTOMY, ADENOIDECTOMY, BILATERAL MYRINGOTOMY AND TUBES    . TUBAL LIGATION  1996    There were no vitals filed for this visit.  Subjective Assessment - 04/18/18 1304    Subjective  Pt states that she is still having some pain in the same areas and its still about a 2/10. She had another doctors appointment this monring and their chairs were lower than usual so she had some pain when standing up from sitting in it.      Pertinent History  C-spine fusion, Lt calf surgery for cyst removal, Lt posterior thigh numbness - workup pending for lt hip and Low back, Bell's Palsy, DM    Patient Stated Goals  To get as much ability back out of Lt knee as possible over 90% hopefully    Currently in Pain?  Yes    Pain Score  2     Pain Location  Knee    Pain Orientation  Left    Pain Descriptors / Indicators  Aching;Dull;Sore    Pain Type  Surgical pain    Pain Onset  More than a month ago    Pain Frequency  Intermittent    Aggravating Factors   sitting, standing, walking, lifting, inactivity or too much activity    Pain Relieving Factors  changing positions, using ice machine    Effect of Pain on Daily Activities  limits ability to do things like laundry because of having to go downstairs           [facility] Adult PT Treatment/Exercise - 04/18/18 0001      Knee/Hip Exercises: Stretches   Active Hamstring Stretch  Left;3  reps;30 seconds    Active Hamstring Stretch Limitations  12in step    Knee: Self-Stretch to increase Flexion  Left;10 seconds    Knee: Self-Stretch Limitations  Knee drive on 40JW12in step 11B10x 10"    Gastroc Stretch  3 reps;30 seconds    Gastroc Stretch Limitations  slant board      Knee/Hip Exercises: Standing   Heel Raises  20 reps    Heel Raises Limitations  heel and toe on slant board    Knee Flexion  Left;15 reps;Limitations    Knee Flexion Limitations  2#    Terminal Knee Extension  Left;15 reps    Theraband Level (Terminal Knee Extension)  Level 3 (Green)    Terminal Knee Extension Limitations  10" holds    Hip Abduction  Both;10 reps;Limitations    Abduction Limitations  RTB    Lateral Step Up  Left;10 reps;Step Height: 6"    Forward Step Up  Left;10 reps;Step Height: 6"    Step Down  Left;10 reps;Hand Hold: 2;Step Height: 6"      Knee/Hip Exercises: Supine   Knee Extension Limitations  1    Knee Flexion Limitations  120      Manual Therapy   Manual Therapy  Soft tissue  mobilization    Manual therapy comments  completed seperately from all other skilled interventions    Soft tissue mobilization  STM L distal HS in prone and distal quad in supine to decrease restriction and decrease pain            PT Education - 04/18/18 1402    Education Details  s/s of infection and to call MD immediately if it occurs; continue HEP    Person(s) Educated  Patient    Methods  Explanation;Demonstration    Comprehension  Verbalized understanding;Returned demonstration       PT Short Term Goals - 04/02/18 1555      PT SHORT TERM GOAL #1   Title  Pt will exhibit lt knee flexion to 110 degrees and extension to -3 degrees to improve ability to ascend and descend stairs with a more fluid gait pattern.    Baseline  -6 to 104    Time  3    Period  Weeks    Status  New    Target Date  04/23/18      PT SHORT TERM GOAL #2   Title  Patient will exhibit lt leg MMT > or = to 4-/5 to exhibit greater functional strength for daily activities.    Baseline  lt hip extension and knee flexion <4-/5    Time  3    Period  Weeks    Status  New    Target Date  04/23/18      PT SHORT TERM GOAL #3   Title  Patient will be able to sit comfortably for 1 hour so she may be able to go out and eat a meal with her spouse.     Baseline  30 minutes    Time  3    Period  Weeks    Status  New    Target Date  04/23/18      PT SHORT TERM GOAL #4   Title  Patient will be able to stand for 30 minutes so she can stand to have a conversation with a friend/family member.    Baseline  20 minutes    Time  3    Period  Weeks    Status  New    Target Date  04/23/18      PT SHORT TERM GOAL #5   Title  Patient's FOTO score will improved by 5 points to reduced overall functional limitations.    Baseline  51% limited    Time  3    Period  Weeks    Status  New    Target Date  04/23/18        PT Long Term Goals - 04/02/18 1602      PT LONG TERM GOAL #1   Title  Patient will be able to  sit comfortably for 2 hours to improve her ability to return to work sooner.    Baseline  30 minutes    Time  6    Period  Weeks    Status  New    Target Date  05/14/18      PT LONG TERM GOAL #2   Title  Patient will be able to ascend and descend the stairs to her laundry room comfortably so she can keep up with her housework.    Baseline  descending difficult and feels unsafe    Time  6    Period  Weeks    Status  New    Target Date  05/14/18      PT LONG TERM GOAL #3   Title  Patient's FOTO score will improved by10 points to exhibit reduced overall functional limitations.    Baseline  51% limited    Time  6    Period  Weeks    Status  New    Target Date  05/14/18            Plan - 04/18/18 1401    Clinical Impression Statement  Continued with established POC focusing on knee ROM and strengthening. She still continues to report pain with stairs and states that it is mainly in her posterolateral knee bilaterally; PT explained that this is likely her hamstrings and is due to strength and soft tissue deficits. Pt tolerated all therex well, not reporting any pain during. Ended with manual STM to L distal hamstrings and quads to address soft tissue restrictions and decrease pain. AROM 1-120deg this date. Pt's L knee/scar noted to have increased redness after lying prone while working on HS; pt stated that it had been slightly more red today but felt that this bright redness was from lying on it during STM. It was noted significantly warm to touch but did have some heat to it; PT educated pt on s/s of infection and told pt to call her MD immediately if it occurred. Her knee had started to decrease in redness once lying supine and at EOS; will continue to follow and address accordingly. Continue as planned, progressing as able.    Rehab Potential  Good    PT Frequency  2x / week    PT Duration  6 weeks    PT Treatment/Interventions  Biofeedback;Cryotherapy;Electrical Stimulation;DME  Instruction;Ultrasound;Moist Heat;Iontophoresis 4mg /ml Dexamethasone;Gait training;Stair training;Functional mobility training;Therapeutic activities;Therapeutic exercise;Balance training;Patient/family education;Neuromuscular re-education;Manual techniques;Manual lymph drainage;Scar mobilization;Passive range of motion;Dry needling;Energy conservation;Taping    PT Next Visit Plan  f/u with L knee redness and if still present;  add prone quad stretch for pain control; continue focus on knee ROM; Progress balance training and strengthening as appropriate. resume SLS/tandem stance.      PT Home Exercise Plan  HHPT HEP: quad sets, heel slides, ankle pumps, standing hamstring curl and heel raises; 6/12: hamstring stretch and SLR  Consulted and Agree with Plan of Care  Patient       Patient will benefit from skilled therapeutic intervention in order to improve the following deficits and impairments:  Abnormal gait, Decreased skin integrity, Pain, Decreased scar mobility, Decreased activity tolerance, Decreased range of motion, Decreased strength, Decreased balance, Difficulty walking, Increased edema  Visit Diagnosis: Muscle weakness (generalized)  Other abnormalities of gait and mobility  Difficulty in walking, not elsewhere classified  Acute pain of left knee     Problem List Patient Active Problem List   Diagnosis Date Noted  . Facial paralysis/Bells palsy 01/27/2014  . Facial droop 01/26/2014  . Facial weakness 01/26/2014  . Bloating 09/30/2013  . Loss of weight 09/30/2013  . Abdominal pain, other specified site 09/30/2013  . Abdominal pain, epigastric 02/10/2011  . Henoch-Schonlein purpura (HCC) 02/10/2011  . Diabetes mellitus without mention of complication 02/10/2011        Jac Canavan PT, DPT  [facility] [address] [place], [place], [address] Phone: [phone]   Fax:  [phone]  Name: Victoria Williams MRN:  [id number] Date of Birth: 1965-12-21

## 2018-04-18 NOTE — Telephone Encounter (Signed)
lvm for pt to call back about scheduling MRI  BCBS Auth: [id number] (exp. 04/18/18 to 05/17/18)

## 2018-04-18 NOTE — Patient Instructions (Signed)
MEMORY LOSS (due to sleep issues, stress, pain) - check MRI brain - brain healthy activities reviewed (nutrition, physical activity, cognitive stimulation, stress mgmt)  MIGRAINE HEADACHES WITHOUT AURA - tylenol / advil (OTC) meds as needed  SNORING / APNEA - refer to sleep study consult

## 2018-04-18 NOTE — Progress Notes (Signed)
[facility]  PATIENT: Victoria Williams DOB: September 17, 1966  REFERRING CLINICIAN: Phillips OdorGolding  HISTORY FROM: patient  REASON FOR VISIT: new consult    HISTORICAL  CHIEF COMPLAINT:  Chief Complaint  Patient presents with  . Memory Loss    rm 7, New Pt,  MMSE 28  "headaches x 4 months, hx Bell's palsy, possible sleep apne?"  . Headache    HISTORY OF PRESENT ILLNESS:   52 year old female here for evaluation of headaches and memory loss.  History of hypertension and diabetes.  History of kidney stones.  Her past 8 to 9 months patient has noticed some intermittent short-term memory problems, decreased attention and focus, feeling scattered, at home and at work.  She has been under more stress at work over the past 1 year.  Sometimes she goes from one room to the next forgets why she went there.  Sometimes she goes shopping and forgets to purchase an item.  She has been trying to use lists, but sometimes she forgets to take the list with her.  Patient has intermittent headaches, global, top of her head, throbbing and pounding sensation with pressure, lasting hours at a time, associated with nausea.  No photophobia or phonophobia.  Headaches occur 1-2 times per month.  Several months ago these flared up and were occurring 5 times per week.  Patient usually takes Tylenol or ibuprofen with fairly good relief.  No family history of migraine.  Patient also has problems with her sleep including insomnia, difficulty falling asleep, difficulty staying asleep, snoring, as well as witnessed apnea during her recent left knee surgery in May 2019.  Patient also has chronic incontinence issues and chronic pain in her joints.  Patient had left-sided Bell's palsy in 2015 now with intermittent left facial twitching.   REVIEW OF SYSTEMS: Full 14 system review of systems performed and negative with exception of: Blurred vision memory loss insomnia snoring restless legs not enough sleep allergies  aching muscles incontinence hearing loss increased thirst.  ALLERGIES: Allergies  Allergen Reactions  . Hydromorphone Hcl Nausea And Vomiting  . Septra [Bactrim] Nausea And Vomiting  . Sulfa Antibiotics Nausea And Vomiting  . Cephalexin Hives  . Ciprocin-Fluocin-Procin [Fluocinolone Acetonide] Hives  . Bee Venom Swelling    REACTION: Severe swelling  . Levofloxacin Hives  . Penicillins   . Celebrex [Celecoxib] Rash    itching    HOME MEDICATIONS: Outpatient Medications Prior to Visit  Medication Sig Dispense Refill  . acetaminophen (TYLENOL) 500 MG tablet Take 1,000 mg by mouth 2 (two) times daily as needed for headache.    [name]. aspirin 81 MG chewable tablet Chew 81 mg by mouth. 04/18/18 X 2 until Thiursday    . dicyclomine (BENTYL) 10 MG capsule 10 mg 2 (two) times daily.  0  . ibuprofen (ADVIL,MOTRIN) 200 MG tablet Take 600 mg by mouth 2 (two) times daily as needed for moderate pain.    [name]. losartan-hydrochlorothiazide (HYZAAR) 100-25 MG tablet Take by mouth 2 (two) times daily.    . metFORMIN (GLUCOPHAGE) 500 MG tablet Take 500 mg by mouth 2 (two) times daily.    [name]. PARoxetine Mesylate 7.5 MG CAPS Take 7.5 mg by mouth at bedtime.    . tolterodine (DETROL) 2 MG tablet 2 mg 2 (two) times daily.  10  . albuterol (PROVENTIL HFA;VENTOLIN HFA) 108 (90 Base) MCG/ACT inhaler Inhale 2 puffs into the lungs every 6 (six) hours as needed. (Patient not taking: Reported on 04/18/2018) 1 Inhaler 0  .  calcium carbonate (TUMS - DOSED IN MG ELEMENTAL CALCIUM) 500 MG chewable tablet Chew 1 tablet by mouth daily.    . polyethylene glycol (MIRALAX / GLYCOLAX) packet Take 17 g by mouth daily.    . benzonatate (TESSALON) 100 MG capsule Take 1 capsule (100 mg total) by mouth every 8 (eight) hours. 21 capsule 0  . doxycycline (VIBRAMYCIN) 100 MG capsule Take 1 capsule (100 mg total) by mouth 2 (two) times daily. 20 capsule 0  . omeprazole (PRILOSEC) 10 MG capsule Take 10 mg by mouth daily.    [name]  oxyCODONE-acetaminophen (PERCOCET/ROXICET) 5-325 MG tablet Take 1-2 tablets by mouth every 6 (six) hours as needed for severe pain. 15 tablet 0  . valACYclovir (VALTREX) 1000 MG tablet Take 1 tablet (1,000 mg total) by mouth 3 (three) times daily. 21 tablet 0   No facility-administered medications prior to visit.     PAST MEDICAL HISTORY: Past Medical History:  Diagnosis Date  . Arthritis   . Bell's palsy    approx 2015  . Chronic kidney disease   . Diabetes mellitus    only when on steriods  . Esophagitis 2012   EGD  . Henoch-Schonlein purpura (HCC)   . History of kidney stones   . History of UTI   . Hypertension   . IBS (irritable bowel syndrome)   . Obesity     PAST SURGICAL HISTORY: Past Surgical History:  Procedure Laterality Date  . ABDOMINAL HYSTERECTOMY  2003  . ACHILLES TENDON SURGERY    . CESAREAN SECTION    . KNEE SURGERY     arthroscopic  . NECK SURGERY  2000   cervical fusion 6/7  . REPLACEMENT TOTAL KNEE Left 2019   [facility]  . TONSILECTOMY, ADENOIDECTOMY, BILATERAL MYRINGOTOMY AND TUBES    . TUBAL LIGATION  1996    FAMILY HISTORY: Family History  Problem Relation Age of Onset  . Leukemia Mother   . Diabetes Mother   . Breast cancer Mother   . Esophageal cancer Brother   . Diabetes Maternal Aunt   . Diabetes Maternal Uncle   . Diabetes Maternal Grandfather   . Heart attack Father   . Colon cancer Neg Hx     SOCIAL HISTORY:  Social History   Socioeconomic History  . Marital status: Married    Spouse name: Not on file  . Number of children: 3  . Years of education: some college  . Highest education level: Not on file  Occupational History    Comment: [employer], loan processor  Social Needs  . Financial resource strain: Not on file  . Food insecurity:    Worry: Not on file    Inability: Not on file  . Transportation needs:    Medical: Not on file    Non-medical: Not on file  Tobacco Use  . Smoking status: Never Smoker  . Smokeless  tobacco: Never Used  Substance and Sexual Activity  . Alcohol use: Yes    Alcohol/week: 0.6 oz    Types: 1 Glasses of wine per week    Comment: 2 a month   . Drug use: No  . Sexual activity: Yes    Birth control/protection: Surgical  Lifestyle  . Physical activity:    Days per week: Not on file    Minutes per session: Not on file  . Stress: Not on file  Relationships  . Social connections:    Talks on phone: Not on file    Gets together:  Not on file    Attends religious service: Not on file    Active member of club or organization: Not on file    Attends meetings of clubs or organizations: Not on file    Relationship status: Not on file  . Intimate partner violence:    Fear of current or ex partner: Not on file    Emotionally abused: Not on file    Physically abused: Not on file    Forced sexual activity: Not on file  Other Topics Concern  . Not on file  Social History Narrative   Lives with spouse   2 caffeine drinks daily      PHYSICAL EXAM  GENERAL EXAM/CONSTITUTIONAL: Vitals:  Vitals:   04/18/18 0949  BP: 123/77  Pulse: 97  Weight: 219 lb 9.6 oz (99.6 kg)  Height: 5\' 3"  (1.6 m)     Body mass index is 38.9 kg/m.  Visual Acuity Screening   Right eye Left eye Both eyes  Without correction:     With correction: 20/50 20/40      Patient is in no distress; well developed, nourished and groomed; neck is supple  CARDIOVASCULAR:  Examination of carotid arteries is normal; no carotid bruits  Regular rate and rhythm, no murmurs  Examination of peripheral vascular system by observation and palpation is normal  EYES:  Ophthalmoscopic exam of optic discs and posterior segments is normal; no papilledema or hemorrhages  MUSCULOSKELETAL:  Gait, strength, tone, movements noted in Neurologic exam below  NEUROLOGIC: MENTAL STATUS:  MMSE - Mini Mental State Exam 04/18/2018  Orientation to time 4  Orientation to Place 5  Registration 3  Attention/  Calculation 5  Recall 3  Language- name 2 objects 2  Language- repeat 0  Language- follow 3 step command 3  Language- read & follow direction 1  Write a sentence 1  Copy design 1  Total score 28    awake, alert, oriented to person, place and time  recent and remote memory intact  normal attention and concentration  language fluent, comprehension intact, naming intact,   fund of knowledge appropriate  CRANIAL NERVE:   2nd - no papilledema on fundoscopic exam  2nd, 3rd, 4th, 6th - pupils equal and reactive to light, visual fields full to confrontation, extraocular muscles intact, no nystagmus  5th - facial sensation symmetric  7th - facial strength --> LEFT UPPER AND LOWER FACIAL WEAKNESS; LEFT SYNKINESIS; LEFT CHIN TWITCHING  8th - hearing intact  9th - palate elevates symmetrically, uvula midline  11th - shoulder shrug symmetric  12th - tongue protrusion midline  MOTOR:   normal bulk and tone, full strength in the BUE, BLE  SENSORY:   normal and symmetric to light touch, temperature, vibration  COORDINATION:   finger-nose-finger, fine finger movements normal  REFLEXES:   deep tendon reflexes TRACE and symmetric  GAIT/STATION:   narrow based gait    DIAGNOSTIC DATA (LABS, IMAGING, TESTING) - I reviewed patient records, labs, notes, testing and imaging myself where available.  Lab Results  Component Value Date   WBC 8.9 05/26/2015   HGB 13.9 05/26/2015   HCT 42.2 05/26/2015   MCV 87.7 05/26/2015   PLT 304 05/26/2015      Component Value Date/Time   NA 138 05/26/2015 0630   K 4.0 05/26/2015 0630   CL 105 05/26/2015 0630   CO2 23 05/26/2015 0630   GLUCOSE 161 (H) 05/26/2015 0630   BUN 12 05/26/2015 0630   CREATININE 0.57 05/26/2015  0630   CALCIUM 9.2 05/26/2015 0630   PROT 7.2 05/26/2015 0630   ALBUMIN 3.7 05/26/2015 0630   AST 17 05/26/2015 0630   ALT 15 05/26/2015 0630   ALKPHOS 74 05/26/2015 0630   BILITOT 0.2 (L) 05/26/2015 0630     GFRNONAA >60 05/26/2015 0630   GFRAA >60 05/26/2015 0630   Lab Results  Component Value Date   CHOL 246 (H) 01/27/2014   HDL 43 01/27/2014   LDLCALC 179 (H) 01/27/2014   TRIG 119 01/27/2014   CHOLHDL 5.7 01/27/2014   Lab Results  Component Value Date   HGBA1C 6.8 (H) 01/27/2014   No results found for: [id number] Lab Results  Component Value Date   TSH 1.738 11/27/2010    01/27/14 MRI brain  - Negative MRI of the brain.    ASSESSMENT AND PLAN  52 y.o. year old female here with chronic intermittent headaches, 1-2 times per month, with migraine features.  Also with 8 to 9 months of short-term memory problems, decreased attention, which may be related to her underlying sleep issues, stress issues and pain.  We will proceed with further work-up.   Dx:  1. Memory loss   2. Other headache syndrome   3. Apnea   4. Snoring      PLAN:  MEMORY LOSS (due to sleep issues, stress, pain) - check MRI brain - brain healthy activities reviewed (nutrition, physical activity, cognitive stimulation, stress mgmt)  MIGRAINE HEADACHES WITHOUT AURA - OTC meds as needed - may consider trial of rizatriptan in future for migraine rescue - may consider propranolol or amitriptyline in future for prevention (cannot take topiramate due to kidney stones)  SNORING / APNEA - refer to sleep study consult   Orders Placed This Encounter  Procedures  . MR BRAIN WO CONTRAST  . Ambulatory referral to Sleep Studies   Return if symptoms worsen or fail to improve, for pending test results.    Suanne Marker, MD 04/18/2018, 10:06 AM Certified in Neurology, Neurophysiology and Neuroimaging  [facility] [address], [address] [place], [place] [address] [phone]

## 2018-04-23 ENCOUNTER — Ambulatory Visit (HOSPITAL_COMMUNITY): Payer: BLUE CROSS/BLUE SHIELD | Attending: Orthopedic Surgery

## 2018-04-23 DIAGNOSIS — M6281 Muscle weakness (generalized): Secondary | ICD-10-CM

## 2018-04-23 DIAGNOSIS — R2689 Other abnormalities of gait and mobility: Secondary | ICD-10-CM | POA: Diagnosis not present

## 2018-04-23 DIAGNOSIS — M25562 Pain in left knee: Secondary | ICD-10-CM | POA: Diagnosis not present

## 2018-04-23 DIAGNOSIS — R262 Difficulty in walking, not elsewhere classified: Secondary | ICD-10-CM | POA: Diagnosis not present

## 2018-04-23 NOTE — Therapy (Signed)
[place], [place], [address] Phone: [phone]   Fax:  [phone]    Progress Note Reporting Period 04/02/18 to 04/23/18  See note below for Objective Data and Assessment of Progress/Goals.    Physical Therapy Treatment  Patient Details  Name: Victoria Williams MRN: [id number] Date of Birth: 1966-04-23 Referring Provider: Malva Cogan, MD   Encounter Date: 04/23/2018  PT End of Session - 04/23/18 1118    Visit Number  7    Number of Visits  12    Date for PT Re-Evaluation  05/14/18 MIni-reassessment completed 04/23/18    Authorization Type  BCBS    Authorization Time Period  04/02/18-05/14/18    Authorization - Visit Number  7    Authorization - Number of Visits  53 7 out of 60 utilized with HHPT    PT Start Time  1117    PT Stop Time  1157    PT Time Calculation (min)  40 min    Activity Tolerance  Patient tolerated treatment well;No increased pain    Behavior During Therapy  WFL for tasks assessed/performed       Past Medical History:  Diagnosis Date  . Arthritis   . Bell's palsy    approx 2015  . Chronic kidney disease   . Diabetes mellitus    only when on steriods  . Esophagitis 2012   EGD  . Henoch-Schonlein purpura ([place])   . History of kidney stones   . History of UTI   . Hypertension   . IBS (irritable bowel syndrome)   . Obesity     Past Surgical History:  Procedure Laterality Date  . ABDOMINAL HYSTERECTOMY  2003  . ACHILLES TENDON SURGERY    . CESAREAN SECTION    . KNEE SURGERY     arthroscopic  . NECK SURGERY  2000   cervical fusion 6/7  . REPLACEMENT TOTAL KNEE Left 2019   [facility]  . TONSILECTOMY, ADENOIDECTOMY, BILATERAL MYRINGOTOMY AND TUBES    . TUBAL LIGATION  1996    There were no vitals filed for this visit.  Subjective Assessment - 04/23/18 1118    Subjective  Pt states that she had a good weekend but had increased swelling in her L knee due to the increase in  activity level; the swelling has since gone down but she states that her knee and ankle were swollen and she has noted a sort of bubble of swelling that ocmes and goes on her scar right at the patella.    Pertinent History  C-spine fusion, Lt calf surgery for cyst removal, Lt posterior thigh numbness - workup pending for lt hip and Low back, Bell's Palsy, DM    Patient Stated Goals  To get as much ability back out of Lt knee as possible over 90% hopefully    Currently in Pain?  Yes    Pain Score  2     Pain Location  Knee    Pain Orientation  Left    Pain Descriptors / Indicators  Aching;Dull;Sore    Pain Type  Surgical pain    Pain Onset  More than a month ago    Pain Frequency  Intermittent    Aggravating Factors   sitting, standing, walking, lifting, inactivity or too much activity    Pain Relieving Factors  changing positions, using ice machine    Effect of Pain on Daily Activities  limits ability to do things like  laungry becuase of having to go downstairs            [facility] PT Assessment - 04/23/18 0001      Assessment   Medical Diagnosis  Lt TKR     Referring Provider  Malva Cogan, MD    Onset Date/Surgical Date  03/08/18    Next MD Visit  05/22/18    Prior Therapy  HHPT      Observation/Other Assessments   Focus on Therapeutic Outcomes (FOTO)   47% limitation was 51% limited      AROM   Left Knee Extension  0 was 6    Left Knee Flexion  117 was 104      Strength   Right Hip Flexion  4+/5 was 4    Right Hip Extension  4/5 was 4-    Right Hip ABduction  4/5 was 4    Left Hip Flexion  4/5 was 4-    Left Hip Extension  4-/5 was 3+    Left Hip ABduction  4-/5 was 4-    Right Knee Flexion  5/5 was 4    Right Knee Extension  5/5 was 4    Left Knee Flexion  4/5 was 3    Left Knee Extension  4+/5 was 4-    Right Ankle Dorsiflexion  5/5 was 4    Left Ankle Dorsiflexion  4+/5 was 4-      Ambulation/Gait   Stairs  Yes    Stair Management Technique  No rails;One  rail Right    Number of Stairs  12    Height of Stairs  7    Gait Comments  ascent was slightly unsteady but overall WFL, descent unsteady, painful, and pt not confident without handrail            [facility] Adult PT Treatment/Exercise - 04/23/18 0001      Knee/Hip Exercises: Stretches   Active Hamstring Stretch  Left;3 reps;30 seconds    Active Hamstring Stretch Limitations  12in step    Quad Stretch  Left;3 reps;30 seconds;Limitations    Quad Stretch Limitations  prone with rope      Knee/Hip Exercises: Machines for Strengthening   Cybex Knee Extension  1 plate, LLE only, x10 reps    Cybex Knee Flexion  3 plates, LLE only, x10 reps           PT Education - 04/23/18 1118    Education Details  reassessment findings    Person(s) Educated  Patient    Methods  Explanation;Demonstration    Comprehension  Verbalized understanding;Returned demonstration       PT Short Term Goals - 04/23/18 1126      PT SHORT TERM GOAL #1   Title  Pt will exhibit lt knee flexion to 110 degrees and extension to -3 degrees to improve ability to ascend and descend stairs with a more fluid gait pattern.    Baseline  7/1: 0-111deg    Time  3    Period  Weeks    Status  Achieved      PT SHORT TERM GOAL #2   Title  Patient will exhibit lt leg MMT > or = to 4-/5 to exhibit greater functional strength for daily activities.    Baseline  7/1: see MMT    Time  3    Period  Weeks    Status  Achieved      PT SHORT TERM GOAL #3   Title  Patient  will be able to sit comfortably for 1 hour so she may be able to go out and eat a meal with her spouse.     Baseline  7/1: agrees with this statement    Time  3    Period  Weeks    Status  Achieved      PT SHORT TERM GOAL #4   Title  Patient will be able to stand for 30 minutes so she can stand to have a conversation with a friend/family member.    Baseline  7/1: agrees with this statement    Time  3    Period  Weeks    Status  Achieved      PT SHORT  TERM GOAL #5   Title  Patient's FOTO score will improved by 5 points to reduced overall functional limitations.    Baseline  7/1: 47% limitation was 51% limited    Time  3    Period  Weeks    Status  On-going        PT Long Term Goals - 04/23/18 1127      PT LONG TERM GOAL #1   Title  Patient will be able to sit comfortably for 2 hours to improve her ability to return to work sooner.    Baseline  7/1: 1 hour comfortably    Time  6    Period  Weeks    Status  On-going      PT LONG TERM GOAL #2   Title  Patient will be able to ascend and descend the stairs to her laundry room comfortably so she can keep up with her housework.    Baseline  descending difficult and feels unsafe    Time  6    Period  Weeks    Status  On-going      PT LONG TERM GOAL #3   Title  Patient's FOTO score will improved by10 points to exhibit reduced overall functional limitations.    Baseline  7/1: 47% limitation was 51% limited    Time  6    Period  Weeks    Status  On-going            Plan - 04/23/18 1200    Clinical Impression Statement  PT reassessed pt's goals and outcome measures this date. Pt has made great progress overall as illustrated above. She has met all but 1 STG while making progress towards all LTG. Her AROM was 0-117deg this date (has been 120deg). Her main complaints at this time are her difficulty with stairs and crossing her L knee over her R knee so that she can paint her toenails; both of which are limited due to pain. PT explained that this is likely due to limitations in quad strength and will begin to increase her strengthening since her AROM has normalized at this point. Pt stating she had a spot on her scar that ballooned out when her knee was swollen this weekend; it has since decreased but PT educated pt to still contact her doctor regarding this to be safe. Continue POC as planned, progressing as able.     Rehab Potential  Good    PT Frequency  2x / week    PT Duration  6  weeks    PT Treatment/Interventions  Biofeedback;Cryotherapy;Electrical Stimulation;DME Instruction;Ultrasound;Moist Heat;Iontophoresis 69m/ml Dexamethasone;Gait training;Stair training;Functional mobility training;Therapeutic activities;Therapeutic exercise;Balance training;Patient/family education;Neuromuscular re-education;Manual techniques;Manual lymph drainage;Scar mobilization;Passive range of motion;Dry needling;Energy conservation;Taping    PT Next Visit Plan  continue prone quad stretch for pain control; begin to increase strengthening since AROM has normalized; Progress balance training and strengthening as appropriate. resume SLS/tandem stance.      PT Home Exercise Plan  HHPT HEP: quad sets, heel slides, ankle pumps, standing hamstring curl and heel raises; 6/12: hamstring stretch and SLR; 7/1: prone quad stretch    Consulted and Agree with Plan of Care  Patient       Patient will benefit from skilled therapeutic intervention in order to improve the following deficits and impairments:  Abnormal gait, Decreased skin integrity, Pain, Decreased scar mobility, Decreased activity tolerance, Decreased range of motion, Decreased strength, Decreased balance, Difficulty walking, Increased edema  Visit Diagnosis: Muscle weakness (generalized)  Other abnormalities of gait and mobility  Difficulty in walking, not elsewhere classified  Acute pain of left knee     Problem List Patient Active Problem List   Diagnosis Date Noted  . Facial paralysis/Bells palsy 01/27/2014  . Facial droop 01/26/2014  . Facial weakness 01/26/2014  . Bloating 09/30/2013  . Loss of weight 09/30/2013  . Abdominal pain, other specified site 09/30/2013  . Abdominal pain, epigastric 02/10/2011  . Henoch-Schonlein purpura ([place]) 02/10/2011  . Diabetes mellitus without mention of complication 34/96/1164       Victoria Williams PT, DPT  [place], [place], [address] Phone: [phone]   Fax:  [phone]  Name: Victoria Williams MRN: [id number] Date of Birth: [birth year]

## 2018-04-24 ENCOUNTER — Ambulatory Visit (INDEPENDENT_AMBULATORY_CARE_PROVIDER_SITE_OTHER): Payer: BLUE CROSS/BLUE SHIELD

## 2018-04-24 DIAGNOSIS — R413 Other amnesia: Secondary | ICD-10-CM

## 2018-04-24 DIAGNOSIS — G4489 Other headache syndrome: Secondary | ICD-10-CM | POA: Diagnosis not present

## 2018-04-25 ENCOUNTER — Ambulatory Visit (HOSPITAL_COMMUNITY): Payer: BLUE CROSS/BLUE SHIELD

## 2018-04-25 ENCOUNTER — Encounter (HOSPITAL_COMMUNITY): Payer: Self-pay

## 2018-04-25 DIAGNOSIS — M25562 Pain in left knee: Secondary | ICD-10-CM

## 2018-04-25 DIAGNOSIS — R2689 Other abnormalities of gait and mobility: Secondary | ICD-10-CM | POA: Diagnosis not present

## 2018-04-25 DIAGNOSIS — R262 Difficulty in walking, not elsewhere classified: Secondary | ICD-10-CM

## 2018-04-25 DIAGNOSIS — M6281 Muscle weakness (generalized): Secondary | ICD-10-CM | POA: Diagnosis not present

## 2018-04-25 NOTE — Therapy (Signed)
[place] [facility] [address] [place], [place], [address] Phone: [phone]   Fax:  [phone]  Physical Therapy Treatment  Patient Details  Name: Victoria LegacyMia M Williams MRN: [id number] Date of Birth: Sep 26, 1966 Referring Provider: Roda Shuttersalvin Maxwell McCabe, MD   Encounter Date: 04/25/2018  PT End of Session - 04/25/18 1129    Visit Number  8    Number of Visits  12    Date for PT Re-Evaluation  05/14/18 Minireassessment complete 04/23/18    Authorization Type  BCBS    Authorization Time Period  04/02/18-05/14/18    Authorization - Visit Number  8    Authorization - Number of Visits  53    PT Start Time  1115    PT Stop Time  1153    PT Time Calculation (min)  38 min    Activity Tolerance  Patient tolerated treatment well;No increased pain    Behavior During Therapy  WFL for tasks assessed/performed       Past Medical History:  Diagnosis Date  . Arthritis   . Bell's palsy    approx 2015  . Chronic kidney disease   . Diabetes mellitus    only when on steriods  . Esophagitis 2012   EGD  . Henoch-Schonlein purpura ([facility])   . History of kidney stones   . History of UTI   . Hypertension   . IBS (irritable bowel syndrome)   . Obesity     Past Surgical History:  Procedure Laterality Date  . ABDOMINAL HYSTERECTOMY  2003  . ACHILLES TENDON SURGERY    . CESAREAN SECTION    . KNEE SURGERY     arthroscopic  . NECK SURGERY  2000   cervical fusion 6/7  . REPLACEMENT TOTAL KNEE Left 2019   [facility]  . TONSILECTOMY, ADENOIDECTOMY, BILATERAL MYRINGOTOMY AND TUBES    . TUBAL LIGATION  1996    There were no vitals filed for this visit.  Subjective Assessment - 04/25/18 1118    Subjective  Pt stated knee is feeling good today, reports MD happy with progress just encouraged to increase height with elevation for edema control and increase ice application.      Pertinent History  C-spine fusion, Lt calf surgery for cyst removal, Lt posterior thigh  numbness - workup pending for lt hip and Low back, Bell's Palsy, DM    Patient Stated Goals  To get as much ability back out of Lt knee as possible over 90% hopefully    Currently in Pain?  No/denies                       [facility] Adult PT Treatment/Exercise - 04/25/18 0001      Knee/Hip Exercises: Stretches   Active Hamstring Stretch  Left;3 reps;30 seconds    Active Hamstring Stretch Limitations  supine    Quad Stretch  Left;3 reps;30 seconds;Limitations    Quad Stretch Limitations  prone with rope    Gastroc Stretch  3 reps;30 seconds    Gastroc Stretch Limitations  slant board      Knee/Hip Exercises: Machines for Strengthening   Cybex Knee Extension  1 plate, LLE only, x10 reps    Cybex Knee Flexion  3 plates, LLE only, x10 reps      Knee/Hip Exercises: Standing   Heel Raises  2 sets;10 reps    Heel Raises Limitations  squat then heel raise with red weight ball    Abduction Limitations  sidestep 2RT RTB    Lateral Step Up  Left;15 reps;Hand Hold: 0;Step Height: 6"    Functional Squat  2 sets;10 reps;Limitations    Functional Squat Limitations  proper lifting: squat then heel raise with red weight ball    Stairs  5RT 7in 1 HR reciprocal pattern    SLS  Rt 43", Lt 41"    SLS with Vectors  3x 5" holds Lt LE    Other Standing Knee Exercises  tandem stance 2x 30" on foam      Knee/Hip Exercises: Supine   Knee Extension  AROM    Knee Extension Limitations  0    Knee Flexion  AROM    Knee Flexion Limitations  121               PT Short Term Goals - 04/23/18 1126      PT SHORT TERM GOAL #1   Title  Pt will exhibit lt knee flexion to 110 degrees and extension to -3 degrees to improve ability to ascend and descend stairs with a more fluid gait pattern.    Baseline  7/1: 0-111deg    Time  3    Period  Weeks    Status  Achieved      PT SHORT TERM GOAL #2   Title  Patient will exhibit lt leg MMT > or = to 4-/5 to exhibit greater functional strength for  daily activities.    Baseline  7/1: see MMT    Time  3    Period  Weeks    Status  Achieved      PT SHORT TERM GOAL #3   Title  Patient will be able to sit comfortably for 1 hour so she may be able to go out and eat a meal with her spouse.     Baseline  7/1: agrees with this statement    Time  3    Period  Weeks    Status  Achieved      PT SHORT TERM GOAL #4   Title  Patient will be able to stand for 30 minutes so she can stand to have a conversation with a friend/family member.    Baseline  7/1: agrees with this statement    Time  3    Period  Weeks    Status  Achieved      PT SHORT TERM GOAL #5   Title  Patient's FOTO score will improved by 5 points to reduced overall functional limitations.    Baseline  7/1: 47% limitation was 51% limited    Time  3    Period  Weeks    Status  On-going        PT Long Term Goals - 04/23/18 1127      PT LONG TERM GOAL #1   Title  Patient will be able to sit comfortably for 2 hours to improve her ability to return to work sooner.    Baseline  7/1: 1 hour comfortably    Time  6    Period  Weeks    Status  On-going      PT LONG TERM GOAL #2   Title  Patient will be able to ascend and descend the stairs to her laundry room comfortably so she can keep up with her housework.    Baseline  descending difficult and feels unsafe    Time  6    Period  Weeks    Status  On-going  PT LONG TERM GOAL #3   Title  Patient's FOTO score will improved by10 points to exhibit reduced overall functional limitations.    Baseline  7/1: 47% limitation was 51% limited    Time  6    Period  Weeks    Status  On-going            Plan - 04/25/18 1157    Clinical Impression Statement  Session focus on functional strengthening and balalnce training.  Progressed functional strengthening with additional squats for proper lifting, reciprocal training on stairs and progressed balance activities with resistance sidestep, vector stance and tandem stance on  foam.  Pt able to demonstrate good mechanics with squats and able to ascend and descend reciprocal pattern 7in stairs with 1 HR.  Pt did reports a slight discomfort descending stairs but no pain following task.  AROM 0-121 degrees today and incision looks good, minimal redness on scars.  No edema present on knee and no pain at EOS.      Rehab Potential  Good    PT Frequency  2x / week    PT Duration  6 weeks    PT Treatment/Interventions  Biofeedback;Cryotherapy;Electrical Stimulation;DME Instruction;Ultrasound;Moist Heat;Iontophoresis 4mg /ml Dexamethasone;Gait training;Stair training;Functional mobility training;Therapeutic activities;Therapeutic exercise;Balance training;Patient/family education;Neuromuscular re-education;Manual techniques;Manual lymph drainage;Scar mobilization;Passive range of motion;Dry needling;Energy conservation;Taping    PT Next Visit Plan  continue prone quad stretch for pain control; session focus with functional strengthening since AROM has normalized; Progress balance training and strengthening as appropriate.  Added palvo with tandem stance next session.      PT Home Exercise Plan  HHPT HEP: quad sets, heel slides, ankle pumps, standing hamstring curl and heel raises; 6/12: hamstring stretch and SLR; 7/1: prone quad stretch       Patient will benefit from skilled therapeutic intervention in order to improve the following deficits and impairments:  Abnormal gait, Decreased skin integrity, Pain, Decreased scar mobility, Decreased activity tolerance, Decreased range of motion, Decreased strength, Decreased balance, Difficulty walking, Increased edema  Visit Diagnosis: Muscle weakness (generalized)  Other abnormalities of gait and mobility  Difficulty in walking, not elsewhere classified  Acute pain of left knee     Problem List Patient Active Problem List   Diagnosis Date Noted  . Facial paralysis/Bells palsy 01/27/2014  . Facial droop 01/26/2014  . Facial  weakness 01/26/2014  . Bloating 09/30/2013  . Loss of weight 09/30/2013  . Abdominal pain, other specified site 09/30/2013  . Abdominal pain, epigastric 02/10/2011  . Henoch-Schonlein purpura ([facility]) 02/10/2011  . Diabetes mellitus without mention of complication 02/10/2011   Becky Sax, LPTA; CBIS [phone]  Juel Burrow 04/25/2018, 12:05 PM  [facility] [address] [place], [place], [address] Phone: [phone]   Fax:  [phone]  Name: ELENY CORTEZ MRN: [id number] Date of Birth: 01-06-1966

## 2018-04-30 ENCOUNTER — Ambulatory Visit (HOSPITAL_COMMUNITY): Payer: BLUE CROSS/BLUE SHIELD

## 2018-04-30 ENCOUNTER — Telehealth: Payer: Self-pay | Admitting: *Deleted

## 2018-04-30 DIAGNOSIS — R262 Difficulty in walking, not elsewhere classified: Secondary | ICD-10-CM

## 2018-04-30 DIAGNOSIS — R2689 Other abnormalities of gait and mobility: Secondary | ICD-10-CM

## 2018-04-30 DIAGNOSIS — M6281 Muscle weakness (generalized): Secondary | ICD-10-CM | POA: Diagnosis not present

## 2018-04-30 DIAGNOSIS — M25562 Pain in left knee: Secondary | ICD-10-CM

## 2018-04-30 NOTE — Telephone Encounter (Signed)
LMVM for pt on her mobile (ok per DPR) that her MRI was read as unremarkable (impression: normal) per Dr. Marjory LiesPenumalli.  She is to call back if questions.

## 2018-04-30 NOTE — Therapy (Signed)
[facility] [address] [place], [place], [address] Phone: [phone]   Fax:  [phone]  Physical Therapy Treatment  Patient Details  Name: Victoria Williams MRN: [id number] Date of Birth: 1966-10-14 Referring Provider: Roda Shutters, MD   Encounter Date: 04/30/2018  PT End of Session - 04/30/18 1107    Visit Number  9    Number of Visits  12    Date for PT Re-Evaluation  05/14/18 Minireassessment complete 04/23/18    Authorization Type  BCBS    Authorization Time Period  04/02/18-05/14/18    Authorization - Visit Number  9    Authorization - Number of Visits  53    PT Start Time  1108    PT Stop Time  1155    PT Time Calculation (min)  47 min    Activity Tolerance  Patient tolerated treatment well;No increased pain    Behavior During Therapy  WFL for tasks assessed/performed       Past Medical History:  Diagnosis Date  . Arthritis   . Bell's palsy    approx 2015  . Chronic kidney disease   . Diabetes mellitus    only when on steriods  . Esophagitis 2012   EGD  . Henoch-Schonlein purpura (HCC)   . History of kidney stones   . History of UTI   . Hypertension   . IBS (irritable bowel syndrome)   . Obesity     Past Surgical History:  Procedure Laterality Date  . ABDOMINAL HYSTERECTOMY  2003  . ACHILLES TENDON SURGERY    . CESAREAN SECTION    . KNEE SURGERY     arthroscopic  . NECK SURGERY  2000   cervical fusion 6/7  . REPLACEMENT TOTAL KNEE Left 2019   [facility]  . TONSILECTOMY, ADENOIDECTOMY, BILATERAL MYRINGOTOMY AND TUBES    . TUBAL LIGATION  1996    There were no vitals filed for this visit.  Subjective Assessment - 04/30/18 1108    Subjective  Pt reports that she feels about the same, about 2/10 achy pain. She states that her bending isn't quite what she wants.    Pertinent History  C-spine fusion, Lt calf surgery for cyst removal, Lt posterior thigh numbness - workup pending for lt hip and Low back,  Bell's Palsy, DM    Patient Stated Goals  To get as much ability back out of Lt knee as possible over 90% hopefully    Currently in Pain?  Yes    Pain Score  2     Pain Location  Knee    Pain Orientation  Left    Pain Descriptors / Indicators  Aching    Pain Type  Surgical pain    Pain Onset  More than a month ago    Pain Frequency  Intermittent    Aggravating Factors   sitting, standing, walking, lifting, inactivity or too much activity    Pain Relieving Factors  changing positions, using ice machines    Effect of Pain on Daily Activities  limits ability to do things like laundry becuase of having to go downstairs             [facility] Adult PT Treatment/Exercise - 04/30/18 0001      Knee/Hip Exercises: Stretches   Active Hamstring Stretch  Left;3 reps;30 seconds    Active Hamstring Stretch Limitations  12" step    Knee: Self-Stretch to increase Flexion  Left;3 reps;30 seconds  Knee: Self-Stretch Limitations  Knee drive on 16XW step    Gastroc Stretch  3 reps;30 seconds    Gastroc Stretch Limitations  slant board      Knee/Hip Exercises: Machines for Strengthening   Cybex Knee Extension  1 plate, LLE only, 2x10 reps    Cybex Knee Flexion  3.5 plates, LLE only, 2x10    Other Machine  power tower BLE: 34deg x10 reps; LLE only: 21deg x10 reps      Knee/Hip Exercises: Standing   Functional Squat  2 sets;10 reps;Limitations    Functional Squat Limitations  proper lifting: squat then heel raise with yellow weight ball    SLS with Vectors  x5RT on foam    Other Standing Knee Exercises  tandem stance on foam +palov press with RTB 2X15 reps each foot forward    Other Standing Knee Exercises  bil tandem stance on 1/2 foam roll upside down 3x10" each      Knee/Hip Exercises: Supine   Knee Extension Limitations  0    Knee Flexion Limitations  121 after MFR; 117 before      Manual Therapy   Manual Therapy  Myofascial release             PT Education - 04/30/18 1108     Education Details  exercise technique    Person(s) Educated  Patient    Methods  Explanation;Demonstration    Comprehension  Verbalized understanding;Returned demonstration       PT Short Term Goals - 04/23/18 1126      PT SHORT TERM GOAL #1   Title  Pt will exhibit lt knee flexion to 110 degrees and extension to -3 degrees to improve ability to ascend and descend stairs with a more fluid gait pattern.    Baseline  7/1: 0-111deg    Time  3    Period  Weeks    Status  Achieved      PT SHORT TERM GOAL #2   Title  Patient will exhibit lt leg MMT > or = to 4-/5 to exhibit greater functional strength for daily activities.    Baseline  7/1: see MMT    Time  3    Period  Weeks    Status  Achieved      PT SHORT TERM GOAL #3   Title  Patient will be able to sit comfortably for 1 hour so she may be able to go out and eat a meal with her spouse.     Baseline  7/1: agrees with this statement    Time  3    Period  Weeks    Status  Achieved      PT SHORT TERM GOAL #4   Title  Patient will be able to stand for 30 minutes so she can stand to have a conversation with a friend/family member.    Baseline  7/1: agrees with this statement    Time  3    Period  Weeks    Status  Achieved      PT SHORT TERM GOAL #5   Title  Patient's FOTO score will improved by 5 points to reduced overall functional limitations.    Baseline  7/1: 47% limitation was 51% limited    Time  3    Period  Weeks    Status  On-going        PT Long Term Goals - 04/23/18 1127      PT LONG TERM GOAL #1  Title  Patient will be able to sit comfortably for 2 hours to improve her ability to return to work sooner.    Baseline  7/1: 1 hour comfortably    Time  6    Period  Weeks    Status  On-going      PT LONG TERM GOAL #2   Title  Patient will be able to ascend and descend the stairs to her laundry room comfortably so she can keep up with her housework.    Baseline  descending difficult and feels unsafe    Time   6    Period  Weeks    Status  On-going      PT LONG TERM GOAL #3   Title  Patient's FOTO score will improved by10 points to exhibit reduced overall functional limitations.    Baseline  7/1: 47% limitation was 51% limited    Time  6    Period  Weeks    Status  On-going            Plan - 04/30/18 1158    Clinical Impression Statement  Continued with established POC focusing on maximizing overall strength, mobility, and balance. Able to add power tower and palov press to balance activities today. No pain reported during session, just challenge with activities. AROM 0-117 at EOS and pt reporting increased pulling over knee cap and along sides of thigh; performed a few mins of MFR to distal quad/proximal scar and pt had increase in flexion to 121deg. Recommend increasing focus on this in future sessions to continue to decrease her pain and maximize her AROM. Continue as planned, progressing as able.     Rehab Potential  Good    PT Frequency  2x / week    PT Duration  6 weeks    PT Treatment/Interventions  Biofeedback;Cryotherapy;Electrical Stimulation;DME Instruction;Ultrasound;Moist Heat;Iontophoresis 4mg /ml Dexamethasone;Gait training;Stair training;Functional mobility training;Therapeutic activities;Therapeutic exercise;Balance training;Patient/family education;Neuromuscular re-education;Manual techniques;Manual lymph drainage;Scar mobilization;Passive range of motion;Dry needling;Energy conservation;Taping    PT Next Visit Plan  resume MFR to quad/proximal scar; continue prone quad stretch for pain control; session focus with functional strengthening since AROM has normalized; Progress balance training and strengthening as appropriate.  continue palov with tandem stance next session.      PT Home Exercise Plan  HHPT HEP: quad sets, heel slides, ankle pumps, standing hamstring curl and heel raises; 6/12: hamstring stretch and SLR; 7/1: prone quad stretch    Consulted and Agree with Plan of  Care  Patient       Patient will benefit from skilled therapeutic intervention in order to improve the following deficits and impairments:  Abnormal gait, Decreased skin integrity, Pain, Decreased scar mobility, Decreased activity tolerance, Decreased range of motion, Decreased strength, Decreased balance, Difficulty walking, Increased edema  Visit Diagnosis: Muscle weakness (generalized)  Other abnormalities of gait and mobility  Difficulty in walking, not elsewhere classified  Acute pain of left knee     Problem List Patient Active Problem List   Diagnosis Date Noted  . Facial paralysis/Bells palsy 01/27/2014  . Facial droop 01/26/2014  . Facial weakness 01/26/2014  . Bloating 09/30/2013  . Loss of weight 09/30/2013  . Abdominal pain, other specified site 09/30/2013  . Abdominal pain, epigastric 02/10/2011  . Henoch-Schonlein purpura (HCC) 02/10/2011  . Diabetes mellitus without mention of complication 02/10/2011       Jac CanavanBrooke Destin Vinsant PT, DPT  [facility] [address] [place], [place], [address] Phone: [phone]  Fax:  [phone]  Name: EDLA PARA MRN: [id number] Date of Birth: [birth year]

## 2018-04-30 NOTE — Telephone Encounter (Signed)
-----   Message from Vikram R Penumalli, MD sent at 04/26/2018 12:51 AM EDT ----- Unremarkable imaging results. Please call patient. Continue current plan. -VRP 

## 2018-05-02 ENCOUNTER — Ambulatory Visit (HOSPITAL_COMMUNITY): Payer: BLUE CROSS/BLUE SHIELD

## 2018-05-02 DIAGNOSIS — R262 Difficulty in walking, not elsewhere classified: Secondary | ICD-10-CM

## 2018-05-02 DIAGNOSIS — R2689 Other abnormalities of gait and mobility: Secondary | ICD-10-CM

## 2018-05-02 DIAGNOSIS — M6281 Muscle weakness (generalized): Secondary | ICD-10-CM

## 2018-05-02 DIAGNOSIS — M25562 Pain in left knee: Secondary | ICD-10-CM | POA: Diagnosis not present

## 2018-05-02 NOTE — Therapy (Signed)
[facility] [address] [place], [place], [address] Phone: [phone]   Fax:  [phone]  Physical Therapy Treatment  Patient Details  Name: Victoria Williams MRN: [id number] Date of Birth: 07/11/1966 Referring Provider: Roda Shutters, MD   Encounter Date: 05/02/2018  PT End of Session - 05/02/18 1116    Visit Number  10    Number of Visits  12    Date for PT Re-Evaluation  05/14/18 Minireassessment complete 04/23/18    Authorization Type  BCBS    Authorization Time Period  04/02/18-05/14/18    Authorization - Visit Number  10    Authorization - Number of Visits  53    PT Start Time  1115    PT Stop Time  1200    PT Time Calculation (min)  45 min    Activity Tolerance  Patient tolerated treatment well;No increased pain    Behavior During Therapy  WFL for tasks assessed/performed       Past Medical History:  Diagnosis Date  . Arthritis   . Bell's palsy    approx 2015  . Chronic kidney disease   . Diabetes mellitus    only when on steriods  . Esophagitis 2012   EGD  . Henoch-Schonlein purpura (HCC)   . History of kidney stones   . History of UTI   . Hypertension   . IBS (irritable bowel syndrome)   . Obesity     Past Surgical History:  Procedure Laterality Date  . ABDOMINAL HYSTERECTOMY  2003  . ACHILLES TENDON SURGERY    . CESAREAN SECTION    . KNEE SURGERY     arthroscopic  . NECK SURGERY  2000   cervical fusion 6/7  . REPLACEMENT TOTAL KNEE Left 2019   [facility]  . TONSILECTOMY, ADENOIDECTOMY, BILATERAL MYRINGOTOMY AND TUBES    . TUBAL LIGATION  1996    There were no vitals filed for this visit.  Subjective Assessment - 05/02/18 1117    Subjective  Pt states that she felt good following last session, especially after the manual because she feels she has more flexibility. She did state that he was in the floor trying to do her stretching and she hurt her hip flexor when getting up. Rates it at 4/10  currently.    Pertinent History  C-spine fusion, Lt calf surgery for cyst removal, Lt posterior thigh numbness - workup pending for lt hip and Low back, Bell's Palsy, DM    Patient Stated Goals  To get as much ability back out of Lt knee as possible over 90% hopefully    Currently in Pain?  Yes    Pain Score  4  2/10 L knee, aching    Pain Location  Hip    Pain Orientation  Left    Pain Descriptors / Indicators  Burning;Aching    Pain Type  Surgical pain    Pain Onset  More than a month ago    Pain Frequency  Intermittent    Aggravating Factors   sitting, standing, walking, lifting, inactivity or too much activity    Pain Relieving Factors  changing positions, using ice machines    Effect of Pain on Daily Activities  limits ability to do things like laundry because of having to go downstairs           [facility] Adult PT Treatment/Exercise - 05/02/18 0001      Knee/Hip Exercises: Stretches   Active Hamstring Stretch  Left;3 reps;30 seconds    Active Hamstring Stretch Limitations  12" step    Knee: Self-Stretch to increase Flexion  Left;3 reps;30 seconds    Knee: Self-Stretch Limitations  Knee drive on 16XW12in step    Gastroc Stretch  3 reps;30 seconds    Gastroc Stretch Limitations  slant board      Knee/Hip Exercises: Machines for Strengthening   Cybex Knee Extension  1 plate, LLE only, 2x15 reps    Cybex Knee Flexion  3.5 plates, LLE only, 2x15    Other Machine  power tower BLE: 34deg x10 reps; LLE only: 21deg x10 reps      Knee/Hip Exercises: Standing   Forward Lunges  Both;2 sets;10 reps    Wall Squat  2 sets;10 reps      Knee/Hip Exercises: Seated   Stool Scoot - Round Trips  fwd/retro x2RT      Knee/Hip Exercises: Supine   Knee Extension Limitations  0    Knee Flexion Limitations  121      Manual Therapy   Manual Therapy  Soft tissue mobilization    Manual therapy comments  completed seperately from all other skilled interventions    Soft tissue mobilization   instrument-assisted and manual STM to distal quad and proximal scar for decreased adhesions and for flexion ROM            PT Education - 05/02/18 1116    Education Details  continue HEP, exercise technqiue    Person(s) Educated  Patient    Methods  Explanation;Demonstration    Comprehension  Verbalized understanding;Returned demonstration       PT Short Term Goals - 04/23/18 1126      PT SHORT TERM GOAL #1   Title  Pt will exhibit lt knee flexion to 110 degrees and extension to -3 degrees to improve ability to ascend and descend stairs with a more fluid gait pattern.    Baseline  7/1: 0-111deg    Time  3    Period  Weeks    Status  Achieved      PT SHORT TERM GOAL #2   Title  Patient will exhibit lt leg MMT > or = to 4-/5 to exhibit greater functional strength for daily activities.    Baseline  7/1: see MMT    Time  3    Period  Weeks    Status  Achieved      PT SHORT TERM GOAL #3   Title  Patient will be able to sit comfortably for 1 hour so she may be able to go out and eat a meal with her spouse.     Baseline  7/1: agrees with this statement    Time  3    Period  Weeks    Status  Achieved      PT SHORT TERM GOAL #4   Title  Patient will be able to stand for 30 minutes so she can stand to have a conversation with a friend/family member.    Baseline  7/1: agrees with this statement    Time  3    Period  Weeks    Status  Achieved      PT SHORT TERM GOAL #5   Title  Patient's FOTO score will improved by 5 points to reduced overall functional limitations.    Baseline  7/1: 47% limitation was 51% limited    Time  3    Period  Weeks    Status  On-going  PT Long Term Goals - 04/23/18 1127      PT LONG TERM GOAL #1   Title  Patient will be able to sit comfortably for 2 hours to improve her ability to return to work sooner.    Baseline  7/1: 1 hour comfortably    Time  6    Period  Weeks    Status  On-going      PT LONG TERM GOAL #2   Title  Patient  will be able to ascend and descend the stairs to her laundry room comfortably so she can keep up with her housework.    Baseline  descending difficult and feels unsafe    Time  6    Period  Weeks    Status  On-going      PT LONG TERM GOAL #3   Title  Patient's FOTO score will improved by10 points to exhibit reduced overall functional limitations.    Baseline  7/1: 47% limitation was 51% limited    Time  6    Period  Weeks    Status  On-going            Plan - 05/02/18 1203    Clinical Impression Statement  Continued with established POC focusing on functional strengthening and addressing soft tissue restrictions of distal quad/proximal scar. Progressed pt to wall squats, lunging, and stool scoots this date; pt challenged with lunging and wall squats due to fatigue. Continued restrictions in proximal scar/distal quad; utilized tool to address restrictions this date. AROM 0 to 121deg today. Continue as planned, progressing as able.    Rehab Potential  Good    PT Frequency  2x / week    PT Duration  6 weeks    PT Treatment/Interventions  Biofeedback;Cryotherapy;Electrical Stimulation;DME Instruction;Ultrasound;Moist Heat;Iontophoresis 4mg /ml Dexamethasone;Gait training;Stair training;Functional mobility training;Therapeutic activities;Therapeutic exercise;Balance training;Patient/family education;Neuromuscular re-education;Manual techniques;Manual lymph drainage;Scar mobilization;Passive range of motion;Dry needling;Energy conservation;Taping    PT Next Visit Plan  cotninue MFR to quad/proximal scar; continue prone quad stretch for pain control; session focus with functional strengthening since AROM has normalized; Progress balance training and strengthening as appropriate.  continue palov with tandem stance next session.      PT Home Exercise Plan  HHPT HEP: quad sets, heel slides, ankle pumps, standing hamstring curl and heel raises; 6/12: hamstring stretch and SLR; 7/1: prone quad stretch     Consulted and Agree with Plan of Care  Patient       Patient will benefit from skilled therapeutic intervention in order to improve the following deficits and impairments:  Abnormal gait, Decreased skin integrity, Pain, Decreased scar mobility, Decreased activity tolerance, Decreased range of motion, Decreased strength, Decreased balance, Difficulty walking, Increased edema  Visit Diagnosis: Muscle weakness (generalized)  Other abnormalities of gait and mobility  Difficulty in walking, not elsewhere classified  Acute pain of left knee     Problem List Patient Active Problem List   Diagnosis Date Noted  . Facial paralysis/Bells palsy 01/27/2014  . Facial droop 01/26/2014  . Facial weakness 01/26/2014  . Bloating 09/30/2013  . Loss of weight 09/30/2013  . Abdominal pain, other specified site 09/30/2013  . Abdominal pain, epigastric 02/10/2011  . Henoch-Schonlein purpura (HCC) 02/10/2011  . Diabetes mellitus without mention of complication 02/10/2011       Jac Canavan PT, DPT  [facility] [address] [place], [place], [address] Phone: [phone]   Fax:  [phone]  Name: Victoria Williams MRN: [id number] Date  of Birth: 07-17-1966

## 2018-05-07 ENCOUNTER — Ambulatory Visit (HOSPITAL_COMMUNITY): Payer: BLUE CROSS/BLUE SHIELD

## 2018-05-07 DIAGNOSIS — R262 Difficulty in walking, not elsewhere classified: Secondary | ICD-10-CM | POA: Diagnosis not present

## 2018-05-07 DIAGNOSIS — M6281 Muscle weakness (generalized): Secondary | ICD-10-CM | POA: Diagnosis not present

## 2018-05-07 DIAGNOSIS — M25562 Pain in left knee: Secondary | ICD-10-CM | POA: Diagnosis not present

## 2018-05-07 DIAGNOSIS — R2689 Other abnormalities of gait and mobility: Secondary | ICD-10-CM | POA: Diagnosis not present

## 2018-05-07 NOTE — Therapy (Signed)
[facility] [address] [place], [place], [address] Phone: [phone]   Fax:  [phone]  Physical Therapy Treatment  Patient Details  Name: Victoria Williams MRN: [id number] Date of Birth: 07/29/1966 Referring Provider: Roda Shutters, MD   Encounter Date: 05/07/2018  PT End of Session - 05/07/18 0940    Visit Number  11    Number of Visits  12    Date for PT Re-Evaluation  05/14/18 Minireassessment complete 04/23/18    Authorization Type  BCBS    Authorization Time Period  04/02/18-05/14/18    Authorization - Visit Number  11    Authorization - Number of Visits  53    PT Start Time  0940    PT Stop Time  1021    PT Time Calculation (min)  41 min    Activity Tolerance  Patient tolerated treatment well;No increased pain    Behavior During Therapy  WFL for tasks assessed/performed       Past Medical History:  Diagnosis Date  . Arthritis   . Bell's palsy    approx 2015  . Chronic kidney disease   . Diabetes mellitus    only when on steriods  . Esophagitis 2012   EGD  . Henoch-Schonlein purpura (HCC)   . History of kidney stones   . History of UTI   . Hypertension   . IBS (irritable bowel syndrome)   . Obesity     Past Surgical History:  Procedure Laterality Date  . ABDOMINAL HYSTERECTOMY  2003  . ACHILLES TENDON SURGERY    . CESAREAN SECTION    . KNEE SURGERY     arthroscopic  . NECK SURGERY  2000   cervical fusion 6/7  . REPLACEMENT TOTAL KNEE Left 2019   [facility]  . TONSILECTOMY, ADENOIDECTOMY, BILATERAL MYRINGOTOMY AND TUBES    . TUBAL LIGATION  1996    There were no vitals filed for this visit.  Subjective Assessment - 05/07/18 0940    Subjective  Pt reports that she was sore following the manual therapy last visit. No pain currently.    Pertinent History  C-spine fusion, Lt calf surgery for cyst removal, Lt posterior thigh numbness - workup pending for lt hip and Low back, Bell's Palsy, DM    Patient  Stated Goals  To get as much ability back out of Lt knee as possible over 90% hopefully    Currently in Pain?  No/denies    Pain Onset  More than a month ago            [facility] Adult PT Treatment/Exercise - 05/07/18 0001      Knee/Hip Exercises: Stretches   Active Hamstring Stretch  Left;3 reps;30 seconds    Active Hamstring Stretch Limitations  12" step    Knee: Self-Stretch to increase Flexion  Left;3 reps;30 seconds    Knee: Self-Stretch Limitations  Knee drive on 69GE step    Gastroc Stretch  3 reps;30 seconds    Gastroc Stretch Limitations  slant board      Knee/Hip Exercises: Machines for Strengthening   Cybex Knee Extension  1.5plates, LLE only, 2x12    Cybex Knee Flexion  4 plates, LLE only, 2x12    Other Machine  power tower LLE only: 24deg, 2x10      Knee/Hip Exercises: Standing   Forward Lunges  Both;2 sets;10 reps    Wall Squat  2 sets;10 reps      Knee/Hip Exercises: Supine  Bridges  Both;2 sets;10 reps    Knee Flexion Limitations  120      Knee/Hip Exercises: Sidelying   Hip ABduction  Left;2 sets;10 reps      Manual Therapy   Manual Therapy  Soft tissue mobilization    Manual therapy comments  completed seperately from all other skilled interventions    Soft tissue mobilization  manual STM to distal quad and proximal scar for decreased adhesions and for flexion ROM            PT Education - 05/07/18 0940    Education Details  will reassess next visit    Person(s) Educated  Patient    Methods  Explanation;Demonstration    Comprehension  Returned demonstration;Verbalized understanding       PT Short Term Goals - 04/23/18 1126      PT SHORT TERM GOAL #1   Title  Pt will exhibit lt knee flexion to 110 degrees and extension to -3 degrees to improve ability to ascend and descend stairs with a more fluid gait pattern.    Baseline  7/1: 0-111deg    Time  3    Period  Weeks    Status  Achieved      PT SHORT TERM GOAL #2   Title  Patient will  exhibit lt leg MMT > or = to 4-/5 to exhibit greater functional strength for daily activities.    Baseline  7/1: see MMT    Time  3    Period  Weeks    Status  Achieved      PT SHORT TERM GOAL #3   Title  Patient will be able to sit comfortably for 1 hour so she may be able to go out and eat a meal with her spouse.     Baseline  7/1: agrees with this statement    Time  3    Period  Weeks    Status  Achieved      PT SHORT TERM GOAL #4   Title  Patient will be able to stand for 30 minutes so she can stand to have a conversation with a friend/family member.    Baseline  7/1: agrees with this statement    Time  3    Period  Weeks    Status  Achieved      PT SHORT TERM GOAL #5   Title  Patient's FOTO score will improved by 5 points to reduced overall functional limitations.    Baseline  7/1: 47% limitation was 51% limited    Time  3    Period  Weeks    Status  On-going        PT Long Term Goals - 04/23/18 1127      PT LONG TERM GOAL #1   Title  Patient will be able to sit comfortably for 2 hours to improve her ability to return to work sooner.    Baseline  7/1: 1 hour comfortably    Time  6    Period  Weeks    Status  On-going      PT LONG TERM GOAL #2   Title  Patient will be able to ascend and descend the stairs to her laundry room comfortably so she can keep up with her housework.    Baseline  descending difficult and feels unsafe    Time  6    Period  Weeks    Status  On-going      PT LONG  TERM GOAL #3   Title  Patient's FOTO score will improved by10 points to exhibit reduced overall functional limitations.    Baseline  7/1: 47% limitation was 51% limited    Time  6    Period  Weeks    Status  On-going            Plan - 05/07/18 1021    Clinical Impression Statement  Continued with established POC focusing on mobility and strength. Pt tolerating treatment well, not reporting any pain during therex. Able to progress pt in weights this date with no issue.  Ended with STM to continue to address soft tissue restrictions in distal quad and proximal scar. AROM flexion to 120deg this date. Pt due for reassessment next visit.    Rehab Potential  Good    PT Frequency  2x / week    PT Duration  6 weeks    PT Treatment/Interventions  Biofeedback;Cryotherapy;Electrical Stimulation;DME Instruction;Ultrasound;Moist Heat;Iontophoresis 4mg /ml Dexamethasone;Gait training;Stair training;Functional mobility training;Therapeutic activities;Therapeutic exercise;Balance training;Patient/family education;Neuromuscular re-education;Manual techniques;Manual lymph drainage;Scar mobilization;Passive range of motion;Dry needling;Energy conservation;Taping    PT Next Visit Plan  reassess; cotninue MFR to quad/proximal scar; continue prone quad stretch for pain control; session focus with functional strengthening since AROM has normalized; Progress balance training and strengthening as appropriate.  continue palov with tandem stance next session.      PT Home Exercise Plan  HHPT HEP: quad sets, heel slides, ankle pumps, standing hamstring curl and heel raises; 6/12: hamstring stretch and SLR; 7/1: prone quad stretch    Consulted and Agree with Plan of Care  Patient       Patient will benefit from skilled therapeutic intervention in order to improve the following deficits and impairments:  Abnormal gait, Decreased skin integrity, Pain, Decreased scar mobility, Decreased activity tolerance, Decreased range of motion, Decreased strength, Decreased balance, Difficulty walking, Increased edema  Visit Diagnosis: Muscle weakness (generalized)  Other abnormalities of gait and mobility  Difficulty in walking, not elsewhere classified  Acute pain of left knee     Problem List Patient Active Problem List   Diagnosis Date Noted  . Facial paralysis/Bells palsy 01/27/2014  . Facial droop 01/26/2014  . Facial weakness 01/26/2014  . Bloating 09/30/2013  . Loss of weight  09/30/2013  . Abdominal pain, other specified site 09/30/2013  . Abdominal pain, epigastric 02/10/2011  . Henoch-Schonlein purpura (HCC) 02/10/2011  . Diabetes mellitus without mention of complication 02/10/2011        Jac CanavanBrooke Eshal Propps PT, DPT  [facility] [address] [place], [place], [address] Phone: [phone]   Fax:  [phone]  Name: Victoria Williams MRN: [id number] Date of Birth: 05/30/1966

## 2018-05-09 ENCOUNTER — Ambulatory Visit (HOSPITAL_COMMUNITY): Payer: BLUE CROSS/BLUE SHIELD

## 2018-05-09 ENCOUNTER — Encounter (HOSPITAL_COMMUNITY): Payer: Self-pay

## 2018-05-09 ENCOUNTER — Telehealth (HOSPITAL_COMMUNITY): Payer: Self-pay

## 2018-05-09 DIAGNOSIS — M6281 Muscle weakness (generalized): Secondary | ICD-10-CM | POA: Diagnosis not present

## 2018-05-09 DIAGNOSIS — R2689 Other abnormalities of gait and mobility: Secondary | ICD-10-CM

## 2018-05-09 DIAGNOSIS — M25562 Pain in left knee: Secondary | ICD-10-CM | POA: Diagnosis not present

## 2018-05-09 DIAGNOSIS — R262 Difficulty in walking, not elsewhere classified: Secondary | ICD-10-CM

## 2018-05-09 NOTE — Patient Instructions (Signed)
Access Code: [email or web address]  URL: [email or web address]  Date: 05/09/2018  Prepared by: Jac CanavanBrooke Powell   Exercises Supine Bridge - 10 reps - 3 sets - 1x daily - 7x weekly Supine Active Straight Leg Raise - 10 reps - 3 sets - 1x daily - 7x weekly Sidelying Hip Abduction - 10 reps - 3 sets - 1x daily - 7x weekly Mini Squat with Chair - 10 reps - 3 sets - 1x daily - 7x weekly Lateral Step Ups - 10 reps - 3 sets - 1x daily - 7x weekly Step Up - 10 reps - 3 sets - 1x daily - 7x weekly Side Stepping with Resistance at Ankles - 10 reps - 3 sets - 1x daily - 7x weekly Standing Hip Abduction with Resistance at Ankles - 10 reps - 3 sets - 1x daily - 7x weekly Knee Extension with Weight Machine - 10 reps - 3 sets - 1x daily - 7x weekly Single Leg Knee Extension with Weight Machine - 10 reps - 3 sets                            - 1x daily - 7x weekly Eccentric Knee Extension with Weight Machine - 10 reps - 3 sets - 1x daily - 7x weekly Hamstring Curl with Weight Machine - 10 reps - 3 sets - 1x daily - 7x weekly Single Leg Hamstring Curl with Weight Machine - 10 reps - 3 sets                            - 1x daily - 7x weekly Eccentric Hamstring Curl with Weight Machine - 10 reps - 3 sets - 1x daily - 7x weekly

## 2018-05-09 NOTE — Telephone Encounter (Signed)
Patient was discharged by Jac CanavanBrooke Powell

## 2018-05-09 NOTE — Therapy (Signed)
[facility] [address] [place], [place], [address] Phone: [phone]   Fax:  [phone]   PHYSICAL THERAPY DISCHARGE SUMMARY  Visits from Start of Care: 12  Current functional level related to goals / functional outcomes: See below   Remaining deficits: See below   Education / Equipment: HEP  Plan: Patient agrees to discharge.  Patient goals were met. Patient is being discharged due to meeting the stated rehab goals.  ?????     Physical Therapy Treatment  Patient Details  Name: Victoria Williams MRN: [id number] Date of Birth: November 07, 1965 Referring Provider: Malva Cogan   Encounter Date: 05/09/2018  PT End of Session - 05/09/18 0947    Visit Number  12    Number of Visits  12    Date for PT Re-Evaluation  05/14/18 Minireassessment complete 04/23/18    Authorization Type  BCBS    Authorization Time Period  04/02/18-05/14/18    Authorization - Visit Number  12    Authorization - Number of Visits  33    PT Start Time  0946    PT Stop Time  1004    PT Time Calculation (min)  18 min    Activity Tolerance  Patient tolerated treatment well;No increased pain    Behavior During Therapy  WFL for tasks assessed/performed       Past Medical History:  Diagnosis Date  . Arthritis   . Bell's palsy    approx 2015  . Chronic kidney disease   . Diabetes mellitus    only when on steriods  . Esophagitis 2012   EGD  . Henoch-Schonlein purpura ([place])   . History of kidney stones   . History of UTI   . Hypertension   . IBS (irritable bowel syndrome)   . Obesity     Past Surgical History:  Procedure Laterality Date  . ABDOMINAL HYSTERECTOMY  2003  . ACHILLES TENDON SURGERY    . CESAREAN SECTION    . KNEE SURGERY     arthroscopic  . NECK SURGERY  2000   cervical fusion 6/7  . REPLACEMENT TOTAL KNEE Left 2019   [facility]  . TONSILECTOMY, ADENOIDECTOMY, BILATERAL MYRINGOTOMY AND TUBES    . TUBAL LIGATION  1996    There  were no vitals filed for this visit.  Subjective Assessment - 05/09/18 0948    Subjective  Pt reports she is feeling good, no pain.    Pertinent History  C-spine fusion, Lt calf surgery for cyst removal, Lt posterior thigh numbness - workup pending for lt hip and Low back, Bell's Palsy, DM    Patient Stated Goals  To get as much ability back out of Lt knee as possible over 90% hopefully    Currently in Pain?  No/denies    Pain Onset  More than a month ago         [facility] PT Assessment - 05/09/18 0001      Assessment   Medical Diagnosis  Lt TKR     Referring Provider  Pricilla Riffle McCabe    Onset Date/Surgical Date  03/08/18    Next MD Visit  05/22/18    Prior Therapy  HHPT      Observation/Other Assessments   Focus on Therapeutic Outcomes (FOTO)   21% limitation was 51% at eval      AROM   Left Knee Flexion  117 was 117      Strength   Right Hip Flexion  5/5 was 4+    Right Hip Extension  4/5 was 4    Right Hip ABduction  4/5 was 4    Left Hip Flexion  4+/5 was 4    Left Hip Extension  4/5 was 4-    Left Hip ABduction  4-/5 was 4-    Left Knee Flexion  5/5 was 4    Left Knee Extension  5/5 was 4+    Left Ankle Dorsiflexion  5/5 was 4+           PT Education - 05/09/18 1010    Education Details  discharge plans, HEP    Person(s) Educated  Patient    Methods  Explanation;Handout    Comprehension  Verbalized understanding       PT Short Term Goals - 05/09/18 0949      PT SHORT TERM GOAL #1   Title  Pt will exhibit lt knee flexion to 110 degrees and extension to -3 degrees to improve ability to ascend and descend stairs with a more fluid gait pattern.    Baseline  7/1: 0-111deg    Time  3    Period  Weeks    Status  Achieved      PT SHORT TERM GOAL #2   Title  Patient will exhibit lt leg MMT > or = to 4-/5 to exhibit greater functional strength for daily activities.    Baseline  7/1: see MMT    Time  3    Period  Weeks    Status  Achieved      PT SHORT  TERM GOAL #3   Title  Patient will be able to sit comfortably for 1 hour so she may be able to go out and eat a meal with her spouse.     Baseline  7/1: agrees with this statement    Time  3    Period  Weeks    Status  Achieved      PT SHORT TERM GOAL #4   Title  Patient will be able to stand for 30 minutes so she can stand to have a conversation with a friend/family member.    Baseline  7/1: agrees with this statement    Time  3    Period  Weeks    Status  Achieved      PT SHORT TERM GOAL #5   Title  Patient's FOTO score will improved by 5 points to reduced overall functional limitations.    Baseline  7/17: 21% limitation was 51% limited    Time  3    Period  Weeks    Status  Achieved        PT Long Term Goals - 05/09/18 0949      PT LONG TERM GOAL #1   Title  Patient will be able to sit comfortably for 2 hours to improve her ability to return to work sooner.    Baseline  7/17: agrees with this statement    Time  6    Period  Weeks    Status  Achieved      PT LONG TERM GOAL #2   Title  Patient will be able to ascend and descend the stairs to her laundry room comfortably so she can keep up with her housework.    Baseline  7/17: no difficulty, feels much safer now    Time  6    Period  Weeks    Status  Achieved  PT LONG TERM GOAL #3   Title  Patient's FOTO score will improved by10 points to exhibit reduced overall functional limitations.    Baseline  7/17: 21% limitation was 51% limited    Time  6    Period  Weeks    Status  Achieved            Plan - 05/09/18 1010    Clinical Impression Statement  PT reassessed pt's goals and outcome measures this date. Pt has made tremendous progress since starting therapy. Her knee AROM has maintained around 0-117 deg for the last few weeks, her pain has significantly improved, and her strength is 4-5/5 throughout all muscle groups. Pt also verbalizing that she feels confident with where she is at and does not have any  limitations. At this time, pt is ready for discharge. Strengthening HEP updated and handout for membership at the local [employer] provided and pt is discharged to her HEP    Rehab Potential  Good    PT Frequency  2x / week    PT Duration  6 weeks    PT Treatment/Interventions  Biofeedback;Cryotherapy;Electrical Stimulation;DME Instruction;Ultrasound;Moist Heat;Iontophoresis 61m/ml Dexamethasone;Gait training;Stair training;Functional mobility training;Therapeutic activities;Therapeutic exercise;Balance training;Patient/family education;Neuromuscular re-education;Manual techniques;Manual lymph drainage;Scar mobilization;Passive range of motion;Dry needling;Energy conservation;Taping    PT Next Visit Plan  [place] [place] [facility] [place] sets, heel slides, ankle pumps, standing hamstring curl and heel raises; 6/12: hamstring stretch and SLR; 7/1: prone quad stretch; 7/17: see below for extensive additions    Consulted and Agree with Plan of Care  Patient       Patient will benefit from skilled therapeutic intervention in order to improve the following deficits and impairments:  Abnormal gait, Decreased skin integrity, Pain, Decreased scar mobility, Decreased activity tolerance, Decreased range of motion, Decreased strength, Decreased balance, Difficulty walking, Increased edema  Visit Diagnosis: Muscle weakness (generalized)  Other abnormalities of gait and mobility  Difficulty in walking, not elsewhere classified  Acute pain of left knee     Problem List Patient Active Problem List   Diagnosis Date Noted  . Facial paralysis/Bells palsy 01/27/2014  . Facial droop 01/26/2014  . Facial weakness 01/26/2014  . Bloating 09/30/2013  . Loss of weight 09/30/2013  . Abdominal pain, other specified site 09/30/2013  . Abdominal pain, epigastric 02/10/2011  . [place] purpura [place] 02/10/2011  . Diabetes mellitus without mention of complication 025/63/8937      BGeraldine SolarPT, DPT  [place] [address]Phone: [phone]  Fax:  [phone] Name: MATOYA ANDREWMRN: [id number] of Birth: 203/16/1967

## 2018-05-14 ENCOUNTER — Encounter (HOSPITAL_COMMUNITY): Payer: BLUE CROSS/BLUE SHIELD

## 2018-06-19 DIAGNOSIS — I10 Essential (primary) hypertension: Secondary | ICD-10-CM | POA: Diagnosis not present

## 2018-06-19 DIAGNOSIS — Z96652 Presence of left artificial knee joint: Secondary | ICD-10-CM | POA: Diagnosis not present

## 2018-06-21 ENCOUNTER — Encounter: Payer: Self-pay | Admitting: Neurology

## 2018-06-21 ENCOUNTER — Ambulatory Visit (INDEPENDENT_AMBULATORY_CARE_PROVIDER_SITE_OTHER): Payer: BLUE CROSS/BLUE SHIELD | Admitting: Neurology

## 2018-06-21 VITALS — BP 138/89 | HR 83 | Ht 64.0 in | Wt 221.0 lb

## 2018-06-21 DIAGNOSIS — R419 Unspecified symptoms and signs involving cognitive functions and awareness: Secondary | ICD-10-CM

## 2018-06-21 DIAGNOSIS — R0683 Snoring: Secondary | ICD-10-CM

## 2018-06-21 DIAGNOSIS — G4719 Other hypersomnia: Secondary | ICD-10-CM

## 2018-06-21 DIAGNOSIS — E669 Obesity, unspecified: Secondary | ICD-10-CM

## 2018-06-21 DIAGNOSIS — R51 Headache: Secondary | ICD-10-CM

## 2018-06-21 DIAGNOSIS — G510 Bell's palsy: Secondary | ICD-10-CM

## 2018-06-21 DIAGNOSIS — R0681 Apnea, not elsewhere classified: Secondary | ICD-10-CM

## 2018-06-21 DIAGNOSIS — Z820 Family history of epilepsy and other diseases of the nervous system: Secondary | ICD-10-CM

## 2018-06-21 DIAGNOSIS — R519 Headache, unspecified: Secondary | ICD-10-CM

## 2018-06-21 NOTE — Patient Instructions (Signed)

## 2018-06-21 NOTE — Progress Notes (Signed)
Subjective:    Patient ID: Victoria Williams is a 52 y.o. female.  HPI     Huston FoleySaima Josslyn Ciolek, MD, PhD [facility] [address], [address] [address] [place], [place] [id number]  Dear Satira SarkVikram,   I saw your patient, Victoria Williams, upon your kind request, in my clinic today for initial consultation of her sleep disorder, in particular, concern for underlying obstructive sleep apnea. The patient is unaccompanied today. As you know, Victoria Williams is a 52 year old right-handed woman with an underlying medical history of hypertension, irritable bowel syndrome, history of kidney stones, diabetes, Bell's palsy 4 years ago with residual facial droop on the L, arthritis with s/p L TKA in 5/19, and obesity, who reports snoring and excessive daytime somnolence. I reviewed your office note from 04/18/2018. She has had recurrent headaches and memory loss at the time. She had an interim brain MRI in July which was reported as unremarkable without contrast. Her Epworth sleepiness score is 10 out of 24, fatigue score is 42 out of 63. She is married and lives with her husband, they have 3 children. She works for [employer]. She is a nonsmoker and drinks alcohol rarely, caffeine in the form of soda, 16 ounce per day on average. She has woken up with a headache. She does not have night to night nocturia but suffers from overactive bladder and urinary incontinence. Since her Bell's palsy she has had difficulty with mouth dryness and eye dryness. She takes eyedrops for this and sees her eye doctor twice a year. She had knee replacement surgery on 03/08/2018 and was encouraged to get checked out for obstructive sleep apnea. She had neck surgery in 2000, she had multiple kidney stone procedures and had a tonsillectomy in 1979. Her bedtime is generally between 9:30 and 10 and rise time around 4:30 or 5. Her brother has sleep apnea and uses a BiPAP machine. She has a history of restless leg syndrome with treatment in the  past for this for about 4 years but she no longer has any significant symptoms in that regard.  Her Past Medical History Is Significant For: Past Medical History:  Diagnosis Date  . Arthritis   . Bell's palsy    approx 2015  . Chronic kidney disease   . Diabetes mellitus    only when on steriods  . Esophagitis 2012   EGD  . Henoch-Schonlein purpura (HCC)   . History of kidney stones   . History of UTI   . Hypertension   . IBS (irritable bowel syndrome)   . Obesity     Her Past Surgical History Is Significant For: Past Surgical History:  Procedure Laterality Date  . ABDOMINAL HYSTERECTOMY  2003  . ACHILLES TENDON SURGERY    . CESAREAN SECTION    . KNEE SURGERY     arthroscopic  . NECK SURGERY  2000   cervical fusion 6/7  . REPLACEMENT TOTAL KNEE Left 2019   [facility]  . TONSILECTOMY, ADENOIDECTOMY, BILATERAL MYRINGOTOMY AND TUBES    . TUBAL LIGATION  1996    Her Family History Is Significant For: Family History  Problem Relation Age of Onset  . Leukemia Mother   . Diabetes Mother   . Breast cancer Mother   . Esophageal cancer Brother   . Diabetes Maternal Aunt   . Diabetes Maternal Uncle   . Diabetes Maternal Grandfather   . Heart attack Father   . Colon cancer Neg Hx     Her Social History  Is Significant For: Social History   Socioeconomic History  . Marital status: Married    Spouse name: Not on file  . Number of children: 3  . Years of education: some college  . Highest education level: Not on file  Occupational History    Comment: [employer], loan processor  Social Needs  . Financial resource strain: Not on file  . Food insecurity:    Worry: Not on file    Inability: Not on file  . Transportation needs:    Medical: Not on file    Non-medical: Not on file  Tobacco Use  . Smoking status: Never Smoker  . Smokeless tobacco: Never Used  Substance and Sexual Activity  . Alcohol use: Yes    Alcohol/week: 1.0 standard drinks    Types: 1 Glasses of  wine per week    Comment: 2 a month   . Drug use: No  . Sexual activity: Yes    Birth control/protection: Surgical  Lifestyle  . Physical activity:    Days per week: Not on file    Minutes per session: Not on file  . Stress: Not on file  Relationships  . Social connections:    Talks on phone: Not on file    Gets together: Not on file    Attends religious service: Not on file    Active member of club or organization: Not on file    Attends meetings of clubs or organizations: Not on file    Relationship status: Not on file  Other Topics Concern  . Not on file  Social History Narrative   Lives with spouse   2 caffeine drinks daily     Her Allergies Are:  Allergies  Allergen Reactions  . Hydromorphone Hcl Nausea And Vomiting  . Septra [Bactrim] Nausea And Vomiting  . Sulfa Antibiotics Nausea And Vomiting  . Cephalexin Hives  . Ciprocin-Fluocin-Procin [Fluocinolone Acetonide] Hives  . Bee Venom Swelling    REACTION: Severe swelling  . Levofloxacin Hives  . Penicillins   . Celebrex [Celecoxib] Rash    itching  :   Her Current Medications Are:  Outpatient Encounter Medications as of 06/21/2018  Medication Sig  . acetaminophen (TYLENOL) 500 MG tablet Take 1,000 mg by mouth 2 (two) times daily as needed for headache.  . calcium carbonate (TUMS - DOSED IN MG ELEMENTAL CALCIUM) 500 MG chewable tablet Chew 1 tablet by mouth daily.  [name] dicyclomine (BENTYL) 10 MG capsule 10 mg 2 (two) times daily.  [name] ibuprofen (ADVIL,MOTRIN) 200 MG tablet Take 600 mg by mouth 2 (two) times daily as needed for moderate pain.  [name] losartan-hydrochlorothiazide (HYZAAR) 100-25 MG tablet Take by mouth 2 (two) times daily.  . metFORMIN (GLUCOPHAGE) 500 MG tablet Take 500 mg by mouth 2 (two) times daily.  [name] PARoxetine Mesylate 7.5 MG CAPS Take 7.5 mg by mouth at bedtime.  . tolterodine (DETROL) 2 MG tablet 2 mg 2 (two) times daily.  . [DISCONTINUED] albuterol (PROVENTIL HFA;VENTOLIN HFA) 108 (90 Base)  MCG/ACT inhaler Inhale 2 puffs into the lungs every 6 (six) hours as needed.  . [DISCONTINUED] polyethylene glycol (MIRALAX / GLYCOLAX) packet Take 17 g by mouth daily.   No facility-administered encounter medications on file as of 06/21/2018.   :  Review of Systems:  Out of a complete 14 point review of systems, all are reviewed and negative with the exception of these symptoms as listed below: Review of Systems  Neurological:  Pt presents today to discuss her sleep. Pt has never had a sleep study but does endorse snoring.  Epworth Sleepiness Scale 0= would never doze 1= slight chance of dozing 2= moderate chance of dozing 3= high chance of dozing  Sitting and reading: 1 Watching TV: 1 Sitting inactive in a public place (ex. Theater or meeting): 0 As a passenger in a car for an hour without a break: 2 Lying down to rest in the afternoon: 3 Sitting and talking to someone: 0 Sitting quietly after lunch (no alcohol): 2 In a car, while stopped in traffic: 1 Total: 10     Objective:  Neurological Exam  Physical Exam Physical Examination:   Vitals:   06/21/18 1603  BP: 138/89  Pulse: 83    General Examination: The patient is a very pleasant 53 y.o. female in no acute distress. She appears well-developed and well-nourished and well groomed.   HEENT: Normocephalic, atraumatic, pupils are equal, round and reactive to light and accommodation. She has left facial weakness. Extraocular tracking is good, no dysarthria. Hearing is grossly intact. Airway examination reveals moderate mouth dryness, adequate dental hygiene, minimal overbite noted. She has a moderately crowded airway secondary to smaller airway opening and larger uvula, redundant soft palate, tonsils are absent. Neck circumference is 18-5/8 inches. Tongue protrudes centrally and palate elevates symmetrically. She has an unremarkable anterior cervical scar from prior surgery.   Chest: Clear to auscultation without  wheezing, rhonchi or crackles noted.  Heart: S1+S2+0, regular and normal without murmurs, rubs or gallops noted.   Abdomen: Soft, non-tender and non-distended with normal bowel sounds appreciated on auscultation.  Extremities: There is trace pitting edema in the left distal lower extremity.  Skin: Warm and dry without trophic changes noted.  Musculoskeletal: exam reveals no obvious joint deformities, tenderness or joint swelling or erythema. Left knee mildly wider than right. Good range of motion left knee.  Neurologically:  Mental status: The patient is awake, alert and oriented in all 4 spheres. Her immediate and remote memory, attention, language skills and fund of knowledge are appropriate. There is no evidence of aphasia, agnosia, apraxia or anomia. Speech is clear with normal prosody and enunciation. Thought process is linear. Mood is normal and affect is normal.  Cranial nerves II - XII are as described above under HEENT exam. In addition: shoulder shrug is normal with equal shoulder height noted. Motor exam: Normal bulk, strength and tone is noted. There is no tremor. Fine motor skills and coordination: intact with normal finger taps, normal hand movements, normal rapid alternating patting, normal foot taps and normal foot agility.  Cerebellar testing: No dysmetria or intention tremor on finger to nose testing. Heel to shin is unremarkable bilaterally. There is no truncal or gait ataxia.  Sensory exam: intact to light touch in the upper and lower extremities.  Gait, station and balance: She stands easily. No veering to one side is noted. No leaning to one side is noted. Posture is age-appropriate and stance is narrow based. Gait shows normal stride length and normal pace. No problems turning are noted.   Assessment and Plan:  In summary, Morgyn ASHLEAH VALTIERRA is a very pleasant 52 y.o.-year old female with an underlying medical history of hypertension, irritable bowel syndrome, history of  kidney stones, diabetes, Bell's palsy 4 years ago with residual facial droop on the L, arthritis with s/p L TKA in 5/19, and obesity, whose history and physical exam are concerning for obstructive sleep apnea (OSA). I had a  long chat with the patient about my findings and the diagnosis of OSA, its prognosis and treatment options. We talked about medical treatments, surgical interventions and non-pharmacological approaches. I explained in particular the risks and ramifications of untreated moderate to severe OSA, especially with respect to developing cardiovascular disease down the Road, including congestive heart failure, difficult to treat hypertension, cardiac arrhythmias, or stroke. Even type 2 diabetes has, in part, been linked to untreated OSA. Symptoms of untreated OSA include daytime sleepiness, memory problems, mood irritability and mood disorder such as depression and anxiety, lack of energy, as well as recurrent headaches, especially morning headaches. We talked about smoking cessation and trying to maintain a healthy lifestyle in general, as well as the importance of weight control. I encouraged the patient to eat healthy, exercise daily and keep well hydrated, to keep a scheduled bedtime and wake time routine, to not skip any meals and eat healthy snacks in between meals. I advised the patient not to drive when feeling sleepy. I recommended the following at this time: sleep study with potential positive airway pressure titration. (We will score hypopneas at 3%).   I explained the sleep test procedure to the patient and also outlined possible surgical and non-surgical treatment options of OSA, including the use of a custom-made dental device (which would require a referral to a specialist dentist or oral surgeon), upper airway surgical options, such as pillar implants, radiofrequency surgery, tongue base surgery, and UPPP (which would involve a referral to an ENT surgeon). Rarely, jaw surgery such as  mandibular advancement may be considered.  I also explained the CPAP treatment option to the patient, who indicated that she would be willing to try CPAP if the need arises. I explained the importance of being compliant with PAP treatment, not only for insurance purposes but primarily to improve Her symptoms, and for the patient's long term health benefit, including to reduce Her cardiovascular risks. I answered all her questions today and the patient was in agreement. I plan to see her back after the sleep study is completed and encouraged her to call with any interim questions, concerns, problems or updates.   Thank you very much for allowing me to participate in the care of this nice patient. If I can be of any further assistance to you please do not hesitate to call me at [phone].  Sincerely,   Huston Foley, MD, PhD

## 2018-07-10 ENCOUNTER — Other Ambulatory Visit: Payer: Self-pay | Admitting: Obstetrics and Gynecology

## 2018-07-10 DIAGNOSIS — Z1231 Encounter for screening mammogram for malignant neoplasm of breast: Secondary | ICD-10-CM

## 2018-07-13 ENCOUNTER — Ambulatory Visit (INDEPENDENT_AMBULATORY_CARE_PROVIDER_SITE_OTHER): Payer: BLUE CROSS/BLUE SHIELD | Admitting: Neurology

## 2018-07-13 DIAGNOSIS — R419 Unspecified symptoms and signs involving cognitive functions and awareness: Secondary | ICD-10-CM

## 2018-07-13 DIAGNOSIS — G510 Bell's palsy: Secondary | ICD-10-CM

## 2018-07-13 DIAGNOSIS — G4761 Periodic limb movement disorder: Secondary | ICD-10-CM

## 2018-07-13 DIAGNOSIS — G4733 Obstructive sleep apnea (adult) (pediatric): Secondary | ICD-10-CM

## 2018-07-13 DIAGNOSIS — Z820 Family history of epilepsy and other diseases of the nervous system: Secondary | ICD-10-CM

## 2018-07-13 DIAGNOSIS — R51 Headache: Secondary | ICD-10-CM

## 2018-07-13 DIAGNOSIS — R0683 Snoring: Secondary | ICD-10-CM

## 2018-07-13 DIAGNOSIS — R519 Headache, unspecified: Secondary | ICD-10-CM

## 2018-07-13 DIAGNOSIS — G4719 Other hypersomnia: Secondary | ICD-10-CM

## 2018-07-13 DIAGNOSIS — G4720 Circadian rhythm sleep disorder, unspecified type: Secondary | ICD-10-CM

## 2018-07-13 DIAGNOSIS — E669 Obesity, unspecified: Secondary | ICD-10-CM

## 2018-07-13 DIAGNOSIS — R0681 Apnea, not elsewhere classified: Secondary | ICD-10-CM

## 2018-07-17 ENCOUNTER — Telehealth: Payer: Self-pay

## 2018-07-17 NOTE — Telephone Encounter (Signed)
I called pt to discuss her sleep study results. No answer, left a message asking her to call me back. 

## 2018-07-17 NOTE — Addendum Note (Signed)
Addended by: Huston FoleyATHAR, Nickolaus Bordelon on: 07/17/2018 08:00 AM   Modules accepted: Orders

## 2018-07-17 NOTE — Progress Notes (Signed)
Patient referred by Dr. Marjory LiesPenumalli, seen by me on 06/21/18, diagnostic PSG on 07/13/18.   Please call and notify the patient that the recent sleep study showed mild to moderate obstructive sleep apnea with a total AHI of 12.8/hour, REM AHI of 20.2/hour, and O2 nadir of 87%. The absence of supine sleep likely underestimates her sleep disordered breathing. I recommend treatment for her OSA in the form of CPAP; she may see an improvement in her rec HAs and her cognit complaints. This will require a repeat sleep study for proper titration and mask fitting and correct monitoring of the oxygen saturations. Please explain to patient. I have placed an order in the chart. Thanks.  Huston FoleySaima Moe Graca, MD, PhD [facility])

## 2018-07-17 NOTE — Procedures (Signed)
PATIENT'S NAME:  Victoria Williams, Victoria Williams DOB:      01-06-1966      MR#:    [id number]     DATE OF RECORDING: 07/13/2018 REFERRING M.D.:  Joycelyn Schmid, MD Study Performed:   Baseline Polysomnogram HISTORY: 52 year old right-handed woman with an underlying medical history of hypertension, irritable bowel syndrome, history of kidney stones, diabetes, Bell's palsy 4 years ago with residual facial droop on the L, arthritis with s/p L TKA in 5/19, and obesity, who reports snoring and daytime somnolence, memory issues. The patient endorsed the Epworth Sleepiness Scale at 10/24 points. The patient's weight 221 pounds with a height of 64 (inches), resulting in a BMI of 37.6 kg/m2. The patient's neck circumference measured 18.6 inches.  CURRENT MEDICATIONS: Tylenol, Tums, Bentyl, Advil, Motrin, Hyzaar, Glucophage, Detrol, Proventil, Ventolin   PROCEDURE:  This is a multichannel digital polysomnogram utilizing the Somnostar 11.2 system.  Electrodes and sensors were applied and monitored per AASM Specifications.   EEG, EOG, Chin and Limb EMG, were sampled at 200 Hz.  ECG, Snore and Nasal Pressure, Thermal Airflow, Respiratory Effort, CPAP Flow and Pressure, Oximetry was sampled at 50 Hz. Digital video and audio were recorded.      BASELINE STUDY  Lights Out was at 21:31 and Lights On at 05:01.  Total recording time (TRT) was 450.5 minutes, with a total sleep time (TST) of 380 minutes.   The patient's sleep latency was 15 minutes.  REM latency was 209 minutes, which is delayed. The sleep efficiency was 84.4 %.     SLEEP ARCHITECTURE: WASO (Wake after sleep onset) was 51 minutes with mild sleep fragmentation noted.  There were 11.5 minutes in Stage N1, 199 minutes Stage N2, 119 minutes Stage N3 and 50.5 minutes in Stage REM.  The percentage of Stage N1 was 3.%, Stage N2 was 52.4%, Stage N3 was 31.3%, which is increased, and Stage R (REM sleep) was 13.3%, which is reduced. The arousals were noted as: 21 were spontaneous, 1  were associated with PLMs, 0 were associated with respiratory events.   RESPIRATORY ANALYSIS:  There were a total of 81 respiratory events:  1 obstructive apneas, 0 central apneas and 2 mixed apneas with a total of 3 apneas and an apnea index (AI) of .5 /hour. There were 78 hypopneas with a hypopnea index of 12.3 /hour. The patient also had 0 respiratory event related arousals (RERAs).      The total APNEA/HYPOPNEA INDEX (AHI) was 12.8 /hour and the total RESPIRATORY DISTURBANCE INDEX was 12.8 /hour.  17 events occurred in REM sleep and 124 events in NREM. The REM AHI was 20.2 /hour, versus a non-REM AHI of 11.7. The patient spent 0 minutes of total sleep time in the supine position and 380 minutes in non-supine.. The supine AHI was n/a versus a non-supine AHI of 12.8.  OXYGEN SATURATION & C02:  The Wake baseline 02 saturation was 94%, with the lowest being 87%. Time spent below 89% saturation equaled 3 minutes. PERIODIC LIMB MOVEMENTS: The patient had a total of 512 Periodic Limb Movements.  The Periodic Limb Movement (PLM) index was 80.8 and the PLM Arousal index was .2/hour.  Audio and video analysis did not show any abnormal or unusual movements, behaviors, phonations or vocalizations. The patient took 2 bathroom breaks. Moderate to louder snoring was noted. The EKG was in keeping with normal sinus rhythm (NSR).  Post-study, the patient indicated that sleep was worse than usual.   IMPRESSION:  1. Obstructive Sleep Apnea (OSA)  2. Periodic Limb Movement Disorder (PLMD) 3. Dysfunctions associated with sleep stages or arousals from sleep   RECOMMENDATIONS:  1. This study demonstrates overall mild obstructive sleep apnea, moderate in REM sleep with a total AHI of 12.8/hour, REM AHI of 20.2/hour, and O2 nadir of 87%. The absence of supine sleep likely underestimates her sleep disordered breathing. Given the patient's medical history and sleep related complaints, treatment with positive airway  pressure is a reasonable choice. A full-night CPAP titration study is recommended to optimize therapy. Other treatment options may include avoidance of supine sleep position along with weight loss, upper airway or jaw surgery in selected patients or the use of an oral appliance in certain patients. ENT evaluation and/or consultation with a maxillofacial surgeon or dentist may be feasible in some instances.    2. Severe PLMs (periodic limb movements of sleep) were noted during this study with no significant arousals; clinical correlation is recommended.  3. This study shows some sleep fragmentation and mildly abnormal sleep stage percentages; these are nonspecific findings and per se do not signify an intrinsic sleep disorder or a cause for the patient's sleep-related symptoms. Causes include (but are not limited to) the first night effect of the sleep study, circadian rhythm disturbances, medication effect or an underlying mood disorder or medical problem.  4. The patient should be cautioned not to drive, work at heights, or operate dangerous or heavy equipment when tired or sleepy. Review and reiteration of good sleep hygiene measures should be pursued with any patient. 5. The patient will [facility] for discussion of the test results, symptom and treatment compliance review, further management strategies, etc. The referring provider will be notified of the test results.  I certify that I have reviewed the entire raw data recording prior to the issuance of this report in accordance with the Standards of Accreditation of the American Academy of Sleep Medicine (AASM)   Huston FoleySaima Nyaire Denbleyker, MD, PhD Diplomat, American Board of Neurology and Sleep Medicine (Neurology and Sleep Medicine)

## 2018-07-17 NOTE — Telephone Encounter (Signed)
-----   Message from Huston FoleySaima Athar, MD sent at 07/17/2018  8:00 AM EDT ----- Patient referred by Dr. Marjory LiesPenumalli, seen by me on 06/21/18, diagnostic PSG on 07/13/18.   Please call and notify the patient that the recent sleep study showed mild to moderate obstructive sleep apnea with a total AHI of 12.8/hour, REM AHI of 20.2/hour, and O2 nadir of 87%. The absence of supine sleep likely underestimates her sleep disordered breathing. I recommend treatment for her OSA in the form of CPAP; she may see an improvement in her rec HAs and her cognit complaints. This will require a repeat sleep study for proper titration and mask fitting and correct monitoring of the oxygen saturations. Please explain to patient. I have placed an order in the chart. Thanks.  Huston FoleySaima Athar, MD, PhD [facility])

## 2018-07-18 NOTE — Telephone Encounter (Signed)
I called pt to discuss her sleep study results again. No answer, left a message asking her to call me back. 

## 2018-07-19 NOTE — Telephone Encounter (Signed)
I called pt. I advised pt that Dr. Frances Furbish reviewed their sleep study results and found that pt has mild to moderate osa with an O2 nadir of 87% and recommends that pt be treated with a cpap to hopefully help with her headaches and cognitive complaints. Dr. Frances Furbish recommends that pt return for a repeat sleep study in order to properly titrate the cpap and ensure a good mask fit. Pt is agreeable to returning for a titration study. I advised pt that our sleep lab will file with pt's insurance and call pt to schedule the sleep study when we hear back from the pt's insurance regarding coverage of this sleep study. Pt verbalized understanding of results. Pt had no questions at this time but was encouraged to call back if questions arise.

## 2018-07-20 DIAGNOSIS — M7711 Lateral epicondylitis, right elbow: Secondary | ICD-10-CM | POA: Diagnosis not present

## 2018-07-20 DIAGNOSIS — M25521 Pain in right elbow: Secondary | ICD-10-CM | POA: Diagnosis not present

## 2018-08-10 ENCOUNTER — Ambulatory Visit (INDEPENDENT_AMBULATORY_CARE_PROVIDER_SITE_OTHER): Payer: BLUE CROSS/BLUE SHIELD | Admitting: Neurology

## 2018-08-10 DIAGNOSIS — R419 Unspecified symptoms and signs involving cognitive functions and awareness: Secondary | ICD-10-CM

## 2018-08-10 DIAGNOSIS — G4719 Other hypersomnia: Secondary | ICD-10-CM

## 2018-08-10 DIAGNOSIS — G4720 Circadian rhythm sleep disorder, unspecified type: Secondary | ICD-10-CM

## 2018-08-10 DIAGNOSIS — G4733 Obstructive sleep apnea (adult) (pediatric): Secondary | ICD-10-CM

## 2018-08-10 DIAGNOSIS — E669 Obesity, unspecified: Secondary | ICD-10-CM

## 2018-08-10 DIAGNOSIS — G510 Bell's palsy: Secondary | ICD-10-CM

## 2018-08-10 DIAGNOSIS — G4761 Periodic limb movement disorder: Secondary | ICD-10-CM

## 2018-08-10 DIAGNOSIS — R519 Headache, unspecified: Secondary | ICD-10-CM

## 2018-08-10 DIAGNOSIS — R51 Headache: Secondary | ICD-10-CM

## 2018-08-10 DIAGNOSIS — Z820 Family history of epilepsy and other diseases of the nervous system: Secondary | ICD-10-CM

## 2018-08-13 ENCOUNTER — Ambulatory Visit
Admission: RE | Admit: 2018-08-13 | Discharge: 2018-08-13 | Disposition: A | Discharge status: Home / Self Care | Payer: BLUE CROSS/BLUE SHIELD | Source: Ambulatory Visit | Attending: Obstetrics and Gynecology | Admitting: Obstetrics and Gynecology

## 2018-08-13 DIAGNOSIS — Z1231 Encounter for screening mammogram for malignant neoplasm of breast: Secondary | ICD-10-CM

## 2018-08-16 DIAGNOSIS — Z23 Encounter for immunization: Secondary | ICD-10-CM | POA: Diagnosis not present

## 2018-08-22 ENCOUNTER — Telehealth: Payer: Self-pay

## 2018-08-22 NOTE — Telephone Encounter (Signed)
-----   Message from Huston Foley, MD sent at 08/22/2018  8:39 AM EDT ----- Patient referred by Dr. Marjory Lies, seen by me on 06/21/18, diagnostic PSG on 07/13/18. Patient had a CPAP titration study on 08/10/18.  Please call and inform patient that I have entered an order for treatment with positive airway pressure (PAP) treatment for obstructive sleep apnea (OSA). She did well during the latest sleep study with CPAP. We will, therefore, arrange for a machine for home use through a DME (durable medical equipment) company of Her choice; and I will see the patient back in follow-up in about 10 weeks. Please also explain to the patient that I will be looking out for compliance data, which can be downloaded from the machine (stored on an SD card, that is inserted in the machine) or via remote access through a modem, that is built into the machine. At the time of the followup appointment we will discuss sleep study results and how it is going with PAP treatment at home. Please advise patient to bring Her machine at the time of the first FU visit, even though this is cumbersome. Bringing the machine for every visit after that will likely not be needed, but often helps for the first visit to troubleshoot if needed. Please re-enforce the importance of compliance with treatment and the need for [place] to monitor compliance data - often an insurance requirement and actually good feedback for the patient as far as how they are doing.  Also remind patient, that any interim PAP machine or mask issues should be first addressed with the DME company, as they can often help better with technical and mask fit issues. Please ask if patient has a preference regarding DME company.  Please also make sure, the patient has a follow-up appointment with me in about 10 weeks from the setup date, thanks. May see one of our nurse practitioners if needed for proper timing of the FU appointment.  Please fax or rout report to the referring provider.  Thanks,   Huston Foley, MD, PhD [facility])

## 2018-08-22 NOTE — Telephone Encounter (Signed)
I called pt to discuss her sleep study results. No answer, left a message asking her to call me back. 

## 2018-08-22 NOTE — Progress Notes (Signed)
Patient referred by Dr. Marjory Lies, seen by me on 06/21/18, diagnostic PSG on 07/13/18. Patient had a CPAP titration study on 08/10/18.  Please call and inform patient that I have entered an order for treatment with positive airway pressure (PAP) treatment for obstructive sleep apnea (OSA). She did well during the latest sleep study with CPAP. We will, therefore, arrange for a machine for home use through a DME (durable medical equipment) company of Her choice; and I will see the patient back in follow-up in about 10 weeks. Please also explain to the patient that I will be looking out for compliance data, which can be downloaded from the machine (stored on an SD card, that is inserted in the machine) or via remote access through a modem, that is built into the machine. At the time of the followup appointment we will discuss sleep study results and how it is going with PAP treatment at home. Please advise patient to bring Her machine at the time of the first FU visit, even though this is cumbersome. Bringing the machine for every visit after that will likely not be needed, but often helps for the first visit to troubleshoot if needed. Please re-enforce the importance of compliance with treatment and the need for [place] to monitor compliance data - often an insurance requirement and actually good feedback for the patient as far as how they are doing.  Also remind patient, that any interim PAP machine or mask issues should be first addressed with the DME company, as they can often help better with technical and mask fit issues. Please ask if patient has a preference regarding DME company.  Please also make sure, the patient has a follow-up appointment with me in about 10 weeks from the setup date, thanks. May see one of our nurse practitioners if needed for proper timing of the FU appointment.  Please fax or rout report to the referring provider. Thanks,   Huston Foley, MD, PhD [facility])

## 2018-08-22 NOTE — Procedures (Signed)
PATIENT'S NAME:  Desirey, Keahey DOB:      02-Mar-1966      MR#:    [id number]     DATE OF RECORDING: 08/10/2018 REFERRING M.D.:  Joycelyn Schmid, MD Study Performed:   CPAP  Titration HISTORY: 52 year old right-handed woman with an underlying medical history of hypertension, irritable bowel syndrome, history of kidney stones, diabetes, Bell's palsy 4 years ago with residual facial droop on the L, arthritis with s/p L TKA in 5/19, and obesity, who returns for a CPAP titration study. Her baseline study from 07/13/18 showed a total AHI of 12.8/hour, REM AHI of 20.2/hour, and O2 nadir of 87%. The patient's weight 220 pounds with a height of 64 (inches), resulting in a BMI of 37.6 kg/m2. The patient's neck circumference measured 18.6 inches.  CURRENT MEDICATIONS: Tylenol, Tums, Bentyl, Advil, Motrin, Hyzaar, Glucophage, Detrol, Proventil, Ventolin.    PROCEDURE:  This is a multichannel digital polysomnogram utilizing the SomnoStar 11.2 system.  Electrodes and sensors were applied and monitored per AASM Specifications.   EEG, EOG, Chin and Limb EMG, were sampled at 200 Hz.  ECG, Snore and Nasal Pressure, Thermal Airflow, Respiratory Effort, CPAP Flow and Pressure, Oximetry was sampled at 50 Hz. Digital video and audio were recorded.      The patient was fitted with a small Eson 2 nasal mask. CPAP was initiated at 5 cmH20 with heated humidity per AASM standards and pressure was advanced to 6 cm H20 because of hypopneas, apneas and desaturations.  At a PAP pressure of 6 cmH20, there was a reduction of the AHI to 0/hour with some supine REM sleep achieved and O2 nadir of 90%.   Lights Out was at 22:05 and Lights On at 04:56. Total recording time (TRT) was 412 minutes, with a total sleep time (TST) of 396 minutes. The patient's sleep latency was 2.5 minutes. REM latency was 94 minutes.  The sleep efficiency was 96.1%.    SLEEP ARCHITECTURE: WASO (Wake after sleep onset) was 6 minutes. There were 8 minutes in Stage  N1, 217 minutes Stage N2, 72 minutes Stage N3 and 99 minutes in Stage REM.  The percentage of Stage N1 was 2.%, Stage N2 was 54.8%, Stage N3 was 18.2%, which is normal, and Stage R (REM sleep) was 25%, which is normal.   RESPIRATORY ANALYSIS:  There was a total of 2 respiratory events: 1 obstructive apneas, 0 central apneas and 0 mixed apneas with a total of 1 apneas and an apnea index (AI) of .2 /hour. There were 1 hypopneas with a hypopnea index of .2/hour. The patient also had 0 respiratory event related arousals (RERAs).      The total APNEA/HYPOPNEA INDEX (AHI) was .3 /hour and the total RESPIRATORY DISTURBANCE INDEX was .3 /hour  0 events occurred in REM sleep and 2 events in NREM. The REM AHI was 0 /hour versus a non-REM AHI of .4 /hour.  The patient spent 142.5 minutes of total sleep time in the supine position and 254 minutes in non-supine. The supine AHI was 0.4, versus a non-supine AHI of 0.2.  OXYGEN SATURATION & C02:  The baseline 02 saturation was 97%, with the lowest being 89%. Time spent below 89% saturation equaled 0 minutes.  PERIODIC LIMB MOVEMENTS:  The patient had a total of 274 Periodic Limb Movements. The Periodic Limb Movement (PLM) index was 41.5/hour and the PLM Arousal index was 1.5/hour.   Audio and video analysis did not show any abnormal or unusual movements, behaviors, phonations or  vocalizations. The patient took no bathroom breaks. The EKG was in keeping with normal sinus rhythm.  Post-study, the patient indicated that sleep was the same as usual.   IMPRESSION:   1. Obstructive Sleep Apnea (OSA) 2. Periodic Limb Movement Disorder (PLMD)   RECOMMENDATIONS:   1. This study demonstrates resolution of the patient's obstructive sleep apnea with CPAP therapy. I will, therefore, start the patient on home CPAP treatment at a pressure of 6 cm via small nasal mask with heated humidity. The patient should be reminded to be fully compliant with PAP therapy to improve sleep  related symptoms and decrease long term cardiovascular risks. The patient should be reminded, that it may take up to 3 months to get fully used to using PAP with all planned sleep. The earlier full compliance is achieved, the better long term compliance tends to be. Please note that untreated obstructive sleep apnea may carry additional perioperative morbidity. Patients with significant obstructive sleep apnea should receive perioperative PAP therapy and the surgeons and particularly the anesthesiologist should be informed of the diagnosis and the severity of the sleep disordered breathing. 2. Severe PLMs (periodic limb movements of sleep) were noted during this study with no significant arousals; clinical correlation is recommended.  3. The patient should be cautioned not to drive, work at heights, or operate dangerous or heavy equipment when tired or sleepy. Review and reiteration of good sleep hygiene measures should be pursued with any patient. 4. The patient will be seen in follow-up in the sleep clinic at [facility] for discussion of the test results, symptom and treatment compliance review, further management strategies, etc. The referring provider will be notified of the test results.   I certify that I have reviewed the entire raw data recording prior to the issuance of this report in accordance with the Standards of Accreditation of the American Academy of Sleep Medicine (AASM).     Huston Foley, MD, PhD Diplomat, American Board of Neurology and Sleep Medicine (Neurology and Sleep Medicine)

## 2018-08-22 NOTE — Addendum Note (Signed)
Addended by: Huston Foley on: 08/22/2018 08:40 AM   Modules accepted: Orders

## 2018-08-23 NOTE — Telephone Encounter (Signed)
Pt returning RN's call.

## 2018-08-24 NOTE — Telephone Encounter (Signed)
I called pt. I advised pt that Dr. Frances Furbish reviewed their sleep study results and found that pt did well with the cpap during her latest sleep study. Dr. Frances Furbish recommends that pt start a cpap at home. I reviewed PAP compliance expectations with the pt. Pt is agreeable to starting a CPAP. I advised pt that an order will be sent to a DME, [facility], and [facility] will call the pt within about one week after they file with the pt's insurance. [facility] will show the pt how to use the machine, fit for masks, and troubleshoot the CPAP if needed. A follow up appt was made for insurance purposes with Dr. Frances Furbish on 11/22/2018 at 3:00pm. Pt verbalized understanding to arrive 15 minutes early and bring their CPAP. A letter with all of this information in it will be mailed to the pt as a reminder. I verified with the pt that the address we have on file is correct. Pt verbalized understanding of results. Pt had no questions at this time but was encouraged to call back if questions arise. I have sent the order to [facility] and have received confirmation that they have received the order.

## 2018-08-27 DIAGNOSIS — E119 Type 2 diabetes mellitus without complications: Secondary | ICD-10-CM | POA: Diagnosis not present

## 2018-08-27 DIAGNOSIS — R3129 Other microscopic hematuria: Secondary | ICD-10-CM | POA: Diagnosis not present

## 2018-08-27 DIAGNOSIS — R809 Proteinuria, unspecified: Secondary | ICD-10-CM | POA: Diagnosis not present

## 2018-08-27 DIAGNOSIS — R739 Hyperglycemia, unspecified: Secondary | ICD-10-CM | POA: Diagnosis not present

## 2018-08-27 DIAGNOSIS — I10 Essential (primary) hypertension: Secondary | ICD-10-CM | POA: Diagnosis not present

## 2018-09-26 DIAGNOSIS — G4733 Obstructive sleep apnea (adult) (pediatric): Secondary | ICD-10-CM | POA: Diagnosis not present

## 2018-10-27 DIAGNOSIS — G4733 Obstructive sleep apnea (adult) (pediatric): Secondary | ICD-10-CM | POA: Diagnosis not present

## 2018-11-22 ENCOUNTER — Ambulatory Visit: Payer: Self-pay | Admitting: Neurology

## 2018-11-26 DIAGNOSIS — I10 Essential (primary) hypertension: Secondary | ICD-10-CM | POA: Diagnosis not present

## 2018-11-26 DIAGNOSIS — Z6841 Body Mass Index (BMI) 40.0 and over, adult: Secondary | ICD-10-CM | POA: Diagnosis not present

## 2018-11-26 DIAGNOSIS — N959 Unspecified menopausal and perimenopausal disorder: Secondary | ICD-10-CM | POA: Diagnosis not present

## 2018-11-26 DIAGNOSIS — R32 Unspecified urinary incontinence: Secondary | ICD-10-CM | POA: Diagnosis not present

## 2018-11-26 DIAGNOSIS — E1165 Type 2 diabetes mellitus with hyperglycemia: Secondary | ICD-10-CM | POA: Diagnosis not present

## 2018-11-27 DIAGNOSIS — G4733 Obstructive sleep apnea (adult) (pediatric): Secondary | ICD-10-CM | POA: Diagnosis not present

## 2018-11-27 DIAGNOSIS — R32 Unspecified urinary incontinence: Secondary | ICD-10-CM | POA: Diagnosis not present

## 2018-11-27 DIAGNOSIS — I10 Essential (primary) hypertension: Secondary | ICD-10-CM | POA: Diagnosis not present

## 2018-11-27 DIAGNOSIS — E1165 Type 2 diabetes mellitus with hyperglycemia: Secondary | ICD-10-CM | POA: Diagnosis not present

## 2018-12-16 ENCOUNTER — Encounter: Payer: Self-pay | Admitting: Neurology

## 2018-12-18 ENCOUNTER — Encounter: Payer: Self-pay | Admitting: Neurology

## 2018-12-18 ENCOUNTER — Ambulatory Visit: Payer: BLUE CROSS/BLUE SHIELD | Admitting: Neurology

## 2018-12-18 VITALS — BP 152/87 | HR 91 | Ht 64.0 in | Wt 229.0 lb

## 2018-12-18 DIAGNOSIS — Z9989 Dependence on other enabling machines and devices: Secondary | ICD-10-CM | POA: Diagnosis not present

## 2018-12-18 DIAGNOSIS — G4733 Obstructive sleep apnea (adult) (pediatric): Secondary | ICD-10-CM | POA: Diagnosis not present

## 2018-12-18 NOTE — Progress Notes (Signed)
Subjective:    Patient ID: Victoria Williams is a 53 y.o. female.  HPI     Interim history:   Ms. Tuch is a 53 year old right-handed woman with an underlying medical history of hypertension, irritable bowel syndrome, history of kidney stones, diabetes, Bell's palsy 4 years ago with residual facial droop on the L, arthritis with s/p L TKA in 5/19, and obesity, who presents for follow-up consultation of her obstructive sleep apnea after her recent sleep study testing. The patient is unaccompanied today. I first met her on 06/21/2018 at the request of Dr. Leta Baptist, at which time she reported snoring and daytime somnolence. She was advised to proceed with sleep study testing. She had a baseline sleep study, followed by a CPAP titration study. Her baseline sleep study from 07/13/2018 showed a sleep efficiency of 84.4%, sleep latency was 15 minutes, REM sleep was delayed at 209 minutes. Total AHI was 12.8 per hour, REM AHI in the moderate range 20.2 per hour and supine sleep was not achieved, average oxygen saturation 94%, nadir was 87%. She had severe PLMS with no significant arousals. She was advised to proceed with a CPAP titration study. This was on 08/10/2018. CPAP was initiated at 5 cm via small Eson mask and advanced to 6 cm. On that pressure her AHI was 0 per hour with some supine REM sleep achieved an O2 nadir of 90%. She had moderate PLMS with no significant arousals during that study. She achieved normal percentages for slow-wave sleep and REM sleep, REM latency was normal at 94%, sleep latency 2.5 minutes, sleep efficiency high at 96.1%. Based on her test results I prescribed CPAP therapy for home use at a pressure of 6 cm Today, 12/18/2018: I reviewed her CPAP compliance data from 11/17/2018 through 12/16/2018 which is a total of 30 days, during which time she used her CPAP 29 days with percent used days greater than 4 hours at 83%, indicating very good compliance with an average usage of 5 hours  and 50 minutes, residual AHI at goal at 0.2 per hour, leak low with the 95th percentile at 1.3 L/m on a pressure of 6 cm with EPR. She reports doing fairly well. She has adapted to treatment quite well. Some nights she does take it off in the middle of the night as she is bothered by the mask. Generally speaking, she feels improved with regards to her daytime somnolence and sleep quality. Her Epworth sleepiness score is 8 out of 24 today. Previously was 10 out of 24 when I first met her.she does still have some issues with focus and concentration. She has had some interim stressors unfortunately. She also changed jobs which was a positive change in her life, her commute is much less now. Blood pressure numbers have been elevated at times.  Previously:  06/21/18: (She) reports snoring and excessive daytime somnolence. I reviewed your office note from 04/18/2018. She has had recurrent headaches and memory loss at the time. She had an interim brain MRI in July which was reported as unremarkable without contrast. Her Epworth sleepiness score is 10 out of 24, fatigue score is 42 out of 63. She is married and lives with her husband, they have 3 children. She works for [employer]. She is a nonsmoker and drinks alcohol rarely, caffeine in the form of soda, 16 ounce per day on average. She has woken up with a headache. She does not have night to night nocturia but suffers from overactive bladder and urinary incontinence. Since  her Bell's palsy she has had difficulty with mouth dryness and eye dryness. She takes eyedrops for this and sees her eye doctor twice a year. She had knee replacement surgery on 03/08/2018 and was encouraged to get checked out for obstructive sleep apnea. She had neck surgery in 2000, she had multiple kidney stone procedures and had a tonsillectomy in 1979. Her bedtime is generally between 9:30 and 10 and rise time around 4:30 or 5. Her brother has sleep apnea and uses a BiPAP machine. She has a history  of restless leg syndrome with treatment in the past for this for about 4 years but she no longer has any significant symptoms in that regard. The patient's allergies, current medications, family history, past medical history, past social history, past surgical history and problem list were reviewed and updated as appropriate.    Her Past Medical History Is Significant For: Past Medical History:  Diagnosis Date  . Arthritis   . Bell's palsy    approx 2015  . Chronic kidney disease   . Diabetes mellitus    only when on steriods  . Esophagitis 2012   EGD  . Henoch-Schonlein purpura ([place])   . History of kidney stones   . History of UTI   . Hypertension   . IBS (irritable bowel syndrome)   . Obesity     Her Past Surgical History Is Significant For: Past Surgical History:  Procedure Laterality Date  . ABDOMINAL HYSTERECTOMY  2003  . ACHILLES TENDON SURGERY    . CESAREAN SECTION    . KNEE SURGERY     arthroscopic  . NECK SURGERY  2000   cervical fusion 6/7  . REPLACEMENT TOTAL KNEE Left 2019   [facility]  . TONSILECTOMY, ADENOIDECTOMY, BILATERAL MYRINGOTOMY AND TUBES    . TUBAL LIGATION  1996    Her Family History Is Significant For: Family History  Problem Relation Age of Onset  . Leukemia Mother   . Diabetes Mother   . Breast cancer Mother   . Esophageal cancer Brother   . Diabetes Maternal Aunt   . Diabetes Maternal Uncle   . Diabetes Maternal Grandfather   . Heart attack Father   . Colon cancer Neg Hx     Her Social History Is Significant For: Social History   Socioeconomic History  . Marital status: Married    Spouse name: Not on file  . Number of children: 3  . Years of education: some college  . Highest education level: Not on file  Occupational History    Comment: [employer], loan processor  Social Needs  . Financial resource strain: Not on file  . Food insecurity:    Worry: Not on file    Inability: Not on file  . Transportation needs:    Medical: Not  on file    Non-medical: Not on file  Tobacco Use  . Smoking status: Never Smoker  . Smokeless tobacco: Never Used  Substance and Sexual Activity  . Alcohol use: Yes    Alcohol/week: 1.0 standard drinks    Types: 1 Glasses of wine per week    Comment: 2 a month   . Drug use: No  . Sexual activity: Yes    Birth control/protection: Surgical  Lifestyle  . Physical activity:    Days per week: Not on file    Minutes per session: Not on file  . Stress: Not on file  Relationships  . Social connections:    Talks on phone: Not  on file    Gets together: Not on file    Attends religious service: Not on file    Active member of club or organization: Not on file    Attends meetings of clubs or organizations: Not on file    Relationship status: Not on file  Other Topics Concern  . Not on file  Social History Narrative   Lives with spouse   2 caffeine drinks daily     Her Allergies Are:  Allergies  Allergen Reactions  . Hydromorphone Hcl Nausea And Vomiting  . Septra [Bactrim] Nausea And Vomiting  . Sulfa Antibiotics Nausea And Vomiting  . Cephalexin Hives  . Ciprocin-Fluocin-Procin [Fluocinolone Acetonide] Hives  . Bee Venom Swelling    REACTION: Severe swelling  . Levofloxacin Hives  . Penicillins   . Celebrex [Celecoxib] Rash    itching  :   Her Current Medications Are:  Outpatient Encounter Medications as of 12/18/2018  Medication Sig  . acetaminophen (TYLENOL) 500 MG tablet Take 1,000 mg by mouth 2 (two) times daily as needed for headache.  . calcium carbonate (TUMS - DOSED IN MG ELEMENTAL CALCIUM) 500 MG chewable tablet Chew 1 tablet by mouth daily.  [name] dicyclomine (BENTYL) 10 MG capsule 10 mg 2 (two) times daily.  . hydrochlorothiazide (HYDRODIURIL) 25 MG tablet Take 25 mg by mouth daily.  [name] ibuprofen (ADVIL,MOTRIN) 200 MG tablet Take 600 mg by mouth 2 (two) times daily as needed for moderate pain.  [name] losartan-hydrochlorothiazide (HYZAAR) 100-25 MG tablet Take by mouth 2  (two) times daily.  . metFORMIN (GLUCOPHAGE) 500 MG tablet Take 500 mg by mouth 2 (two) times daily.  [name] PARoxetine Mesylate 7.5 MG CAPS Take 7.5 mg by mouth at bedtime.  . tolterodine (DETROL) 2 MG tablet 2 mg 2 (two) times daily.   No facility-administered encounter medications on file as of 12/18/2018.   :  Review of Systems:  Out of a complete 14 point review of systems, all are reviewed and negative with the exception of these symptoms as listed below:  Review of Systems  Neurological:       Pt presents today to discuss her auto pap. Pt reports that she feels better and she is less sleepy during the day.  Epworth Sleepiness Scale 0= would never doze 1= slight chance of dozing 2= moderate chance of dozing 3= high chance of dozing  Sitting and reading: 1 Watching TV: 1 Sitting inactive in a public place (ex. Theater or meeting): 1 As a passenger in a car for an hour without a break: 2 Lying down to rest in the afternoon: 2 Sitting and talking to someone: 0 Sitting quietly after lunch (no alcohol): 1 In a car, while stopped in traffic: 0 Total: 8     Objective:  Neurological Exam  Physical Exam Physical Examination:   Vitals:   12/18/18 0833  BP: (!) 152/87  Pulse: 91   General Examination: The patient is a very pleasant 53 y.o. female in no acute distress. She appears well-developed and well-nourished and well groomed.   HEENT: Normocephalic, atraumatic, pupils are equal, round and reactive to light. She has left facial weakness, stable from prior Bell's palsy. Extraocular tracking is good, no dysarthria. Hearing is grossly intact. Airway examination reveals moderate mouth dryness, adequate dental hygiene, minimal overbite noted. She has a moderately crowded airway. Tongue protrudes centrally and palate elevates symmetrically. She has an unremarkable anterior cervical scar from prior surgery.   Chest: Clear to auscultation without wheezing,  rhonchi or crackles  noted.  Heart: S1+S2+0, regular and normal without murmurs, rubs or gallops noted.   Abdomen: Soft, non-tender and non-distended.  Extremities: There is trace pitting edema in the left distal lower extremity.  Skin: Warm and dry without trophic changes noted.  Musculoskeletal: exam reveals no obvious joint deformities, tenderness or joint swelling or erythema. Left knee mildly wider than right, stable. Good range of motion left knee. Reports discomfort in both thumb joints.   Neurologically:  Mental status: The patient is awake, alert and oriented in all 4 spheres. Her immediate and remote memory, attention, language skills and fund of knowledge are appropriate. There is no evidence of aphasia, agnosia, apraxia or anomia. Speech is clear with normal prosody and enunciation. Thought process is linear. Mood is normal and affect is normal.  Cranial nerves II - XII are as described above under HEENT exam. In addition: shoulder shrug is normal with equal shoulder height noted. Motor exam: Normal bulk, strength and tone is noted. There is no tremor. Fine motor skills and coordination: grossly intact.  Cerebellar testing: No dysmetria or intention tremor on finger to nose testing. Heel to shin is unremarkable bilaterally. There is no truncal or gait ataxia.  Sensory exam: intact to light touch in the upper and lower extremities.  Gait, station and balance: She stands easily. No veering to one side is noted. No leaning to one side is noted. Posture is age-appropriate and stance is narrow based. Gait shows normal stride length and normal pace. No problems turning are noted.   Assessment and Plan:  In summary, Greg MICAELA STITH is a very pleasant 53 year old female with an underlying medical history of hypertension, irritable bowel syndrome, history of kidney stones, diabetes, Bell's palsy 4 years ago with residual facial droop on the L, arthritis with s/p L TKA in 5/19, and obesity, who presents for  follow-up consultation of her obstructive sleep apnea, after sleep study testing and starting CPAP therapy at home. She is compliant with CPAP and indicates good results including better sleep consolidation, better sleep quality and less daytime somnolence. She has a history remotely of restless leg symptoms which are not active currently. Her PLMS improved during her CPAP titration study and she did not have any significant sleep disruption from leg movements. She can be monitored for restless leg symptoms. She does report some ongoing issues with focus and concentration. Unfortunately, she has also had interim stressors. If needed, she can make a follow-up appointment with Dr. Leta Baptist. From the sleep apnea standpoint she is doing rather well. She is encouraged to be fully compliant with her CPAP and make enough time for sleep, 7-8 hours are recommended. Her bedtime does fluctuate, sometimes she may go to bed around 9:30 and sometimes as late as after 11 PM. She does have a fairly set wake time of 5 AM currently. She changed jobs recently. She is advised to follow-up routinely for sleep apnea in one year, she can see Ward Givens, nurse practitioner at the time. I answered all her questions today, we reviewed her sleep study results in detail and her compliance data together. She was in agreement with the plan.  I spent 25 minutes in total face-to-face time with the patient, more than 50% of which was spent in counseling and coordination of care, reviewing test results, reviewing medication and discussing or reviewing the diagnosis of OSA, its prognosis and treatment options. Pertinent laboratory and imaging test results that were available during this visit with  the patient were reviewed by me and considered in my medical decision making (see chart for details).

## 2018-12-18 NOTE — Patient Instructions (Signed)
Please continue using your CPAP regularly. While your insurance requires that you use CPAP at least 4 hours each night on 70% of the nights, I recommend, that you not skip any nights and use it throughout the night if you can. Getting used to CPAP and staying with the treatment long term does take time and patience and discipline. Untreated obstructive sleep apnea when it is moderate to severe can have an adverse impact on cardiovascular health and raise her risk for heart disease, arrhythmias, hypertension, congestive heart failure, stroke and diabetes. Untreated obstructive sleep apnea causes sleep disruption, nonrestorative sleep, and sleep deprivation. This can have an impact on your day to day functioning and cause daytime sleepiness and impairment of cognitive function, memory loss, mood disturbance, and problems focussing. Using CPAP regularly can improve these symptoms.  Please try to make more time for sleep: 7-8 hour are recommended.  Work on weight loss. I hope you have less stress.  We can see you in 1 year, you can see one of our nurse practitioners as you are stable. I will see you after that.

## 2019-01-18 DIAGNOSIS — G4733 Obstructive sleep apnea (adult) (pediatric): Secondary | ICD-10-CM | POA: Diagnosis not present

## 2019-02-14 DIAGNOSIS — Z681 Body mass index (BMI) 19 or less, adult: Secondary | ICD-10-CM | POA: Diagnosis not present

## 2019-02-14 DIAGNOSIS — K529 Noninfective gastroenteritis and colitis, unspecified: Secondary | ICD-10-CM | POA: Diagnosis not present

## 2019-02-14 DIAGNOSIS — Z1389 Encounter for screening for other disorder: Secondary | ICD-10-CM | POA: Diagnosis not present

## 2019-03-04 IMAGING — DX DG CHEST 2V
2 series · 2 of 2 positions shown · non-contrast
Comparison: 05/02/2013

CLINICAL DATA: Dry cough

EXAM:
CHEST  2 VIEW

[chest pa]
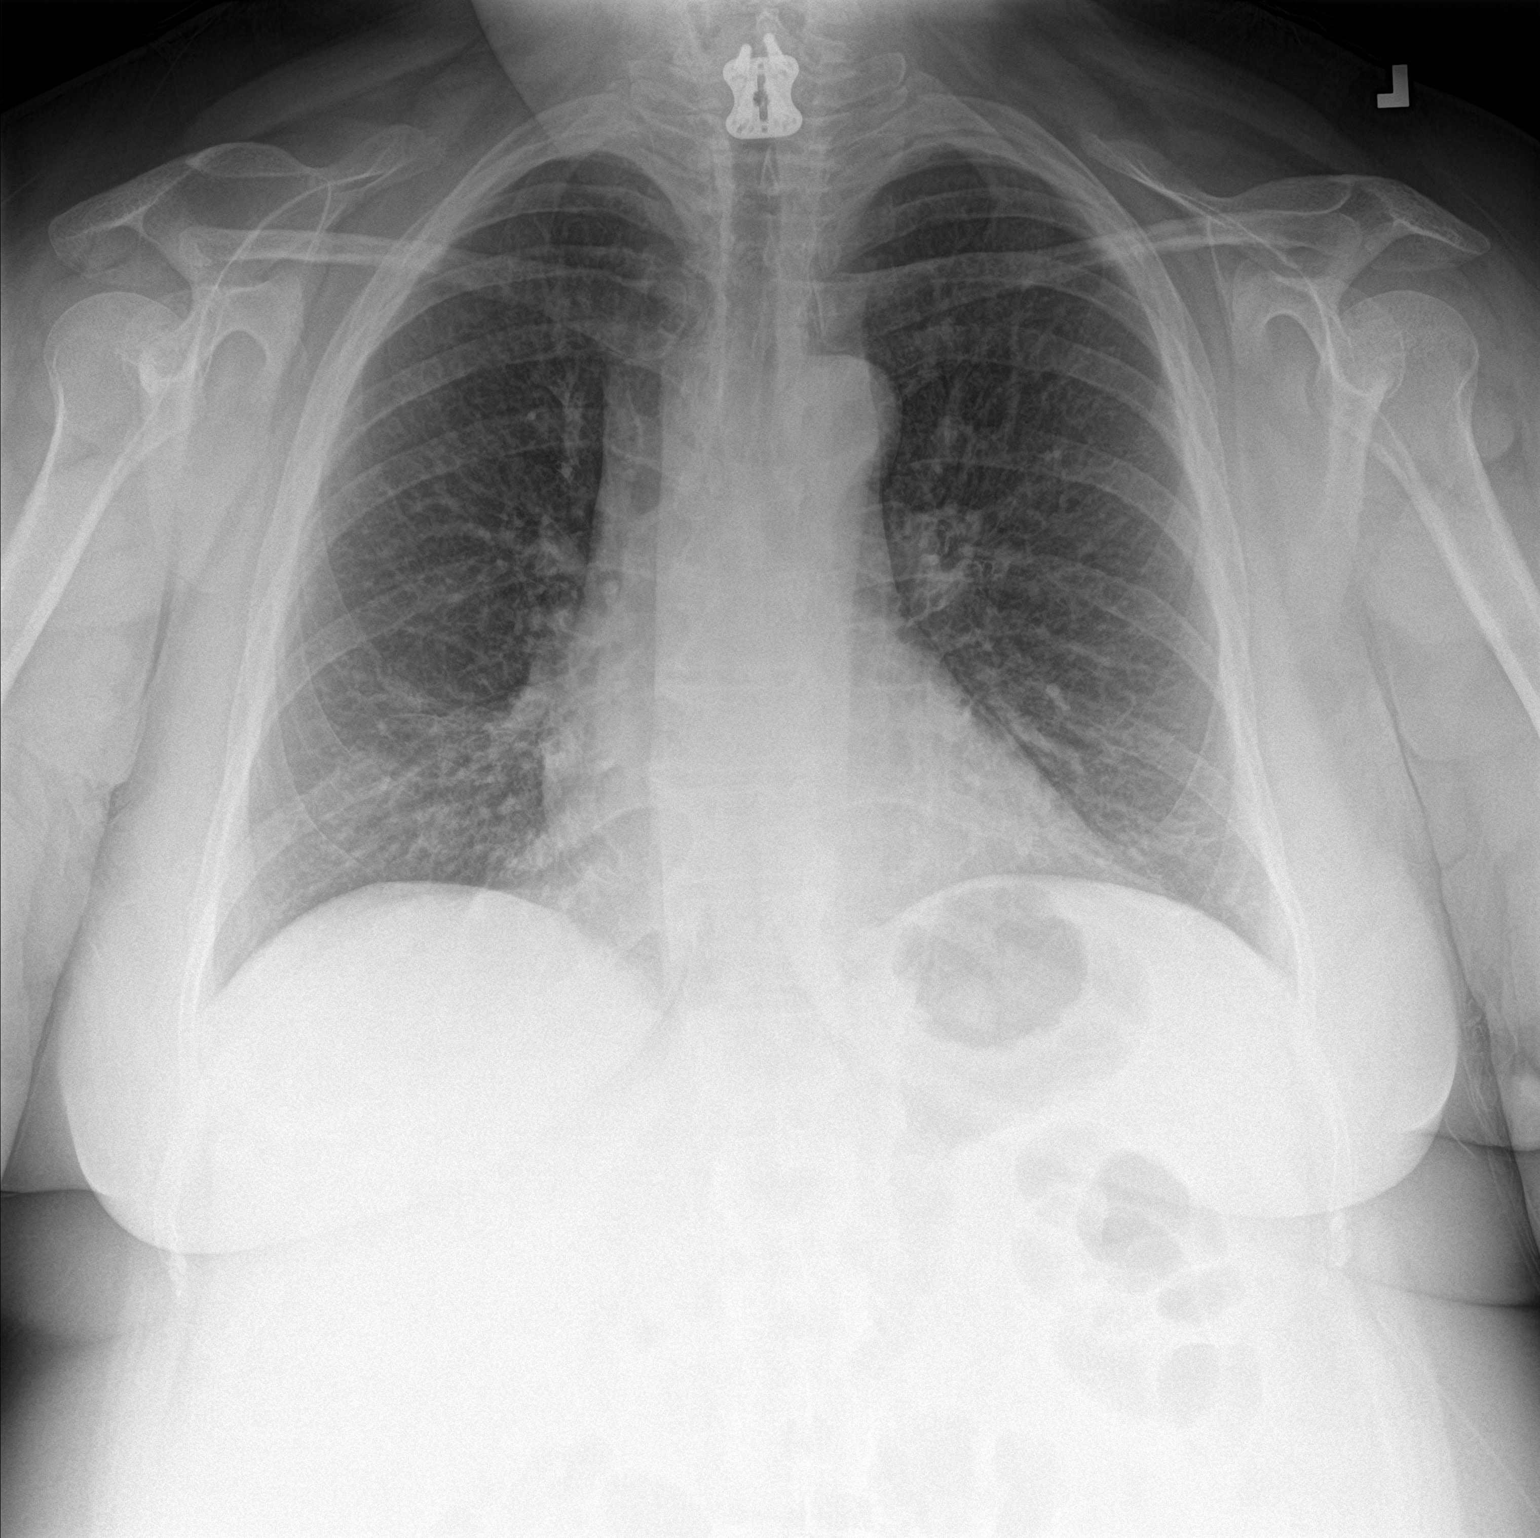

[chest lat]
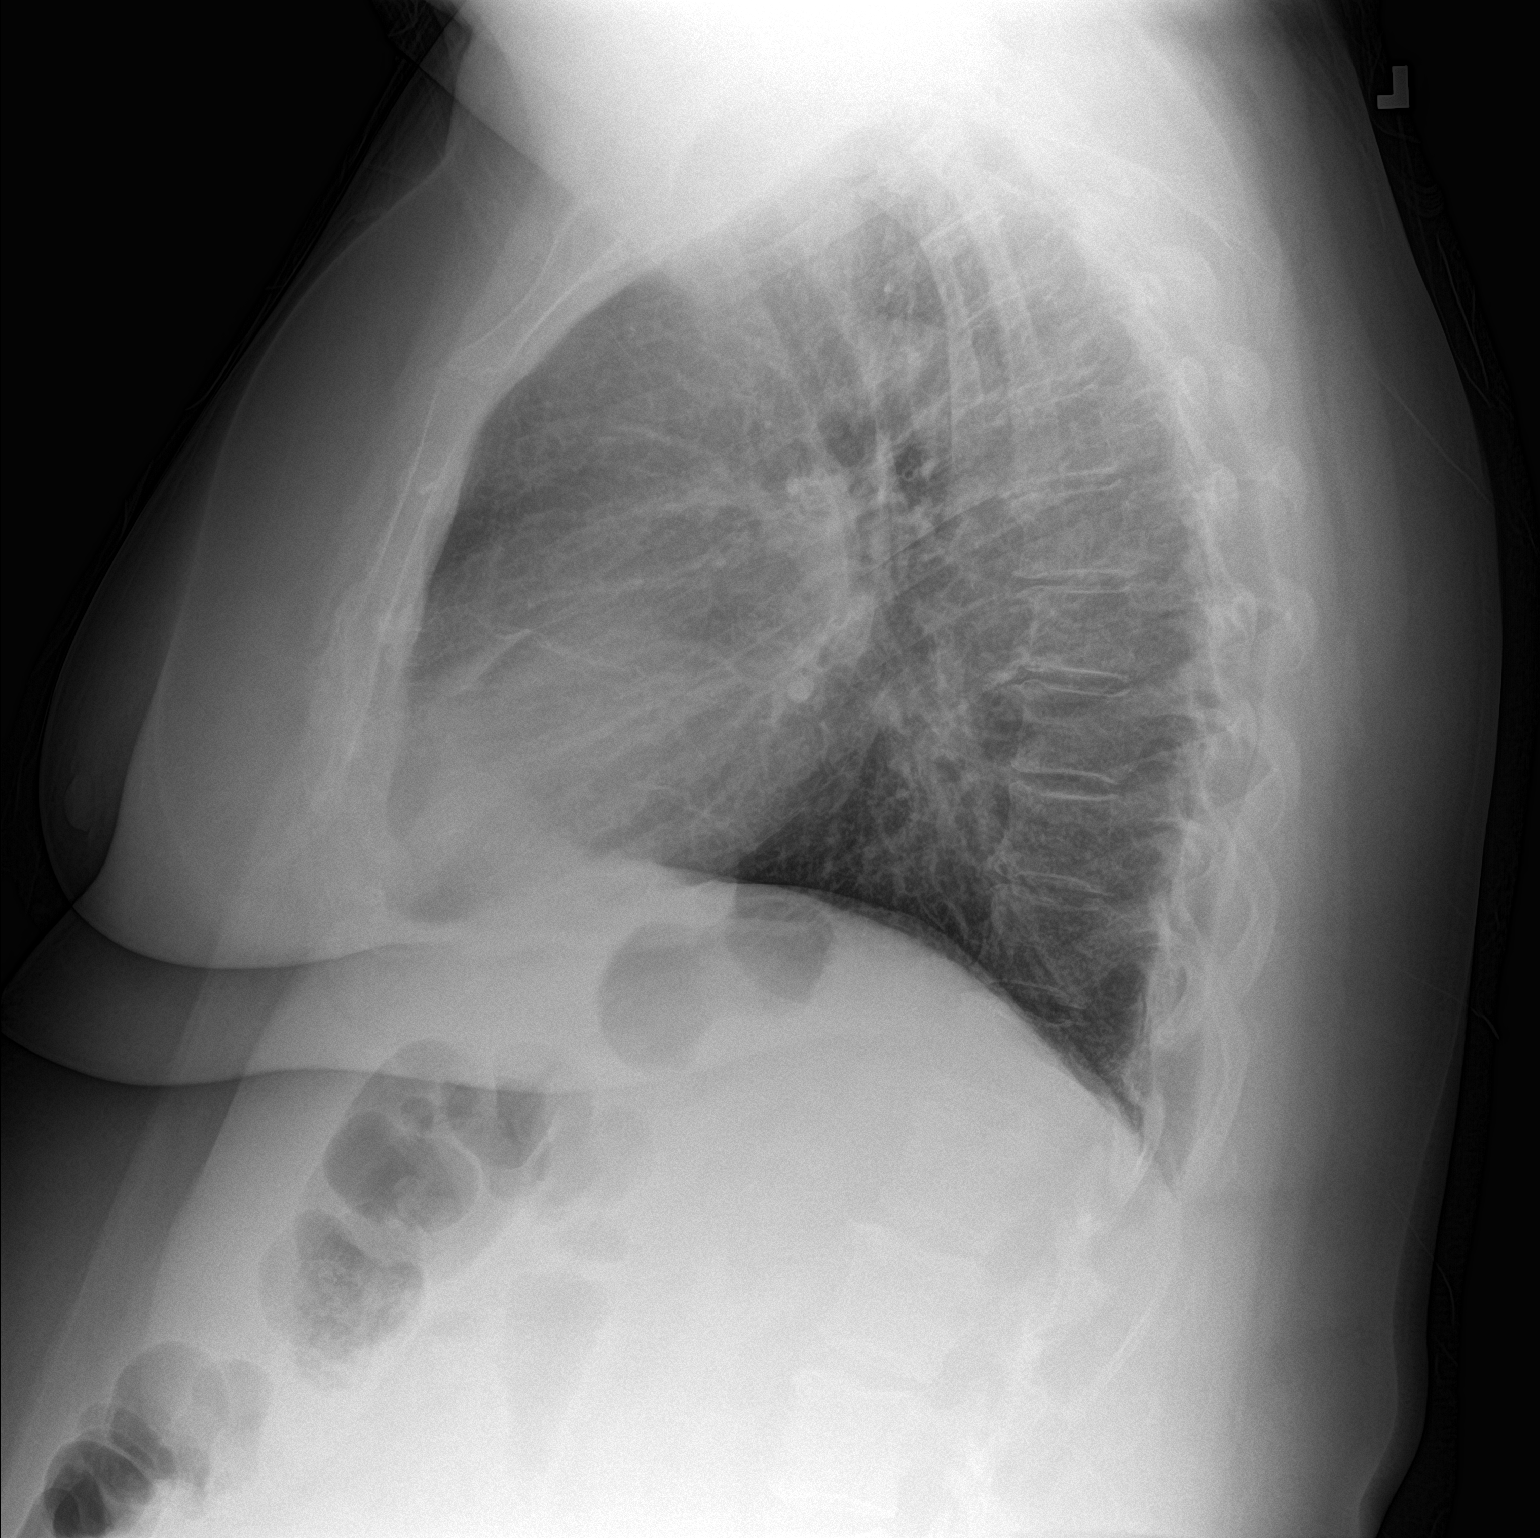

[2 of 2 positions shown; findings below may reference images not displayed]

FINDINGS: Cardiac shadow is within normal limits. The lungs are well aerated
bilaterally. No focal infiltrate or sizable effusion is seen.
Postsurgical changes in the cervical spine are noted. Mild
degenerative change in the thoracic spine is seen.
IMPRESSION: No active cardiopulmonary disease.

## 2019-06-19 DIAGNOSIS — G4733 Obstructive sleep apnea (adult) (pediatric): Secondary | ICD-10-CM | POA: Diagnosis not present

## 2019-08-21 ENCOUNTER — Other Ambulatory Visit: Payer: Self-pay | Admitting: *Deleted

## 2019-08-21 DIAGNOSIS — Z20822 Contact with and (suspected) exposure to covid-19: Secondary | ICD-10-CM

## 2019-08-22 ENCOUNTER — Telehealth: Payer: Self-pay

## 2019-08-22 LAB — NOVEL CORONAVIRUS, NAA: SARS-CoV-2, NAA: NOT DETECTED

## 2019-08-22 NOTE — Telephone Encounter (Signed)
Patient called for COVID-19 test result. Per cart patient was informed that her result was not yet available.  She was encouraged to call back.

## 2019-08-26 DIAGNOSIS — Z681 Body mass index (BMI) 19 or less, adult: Secondary | ICD-10-CM | POA: Diagnosis not present

## 2019-08-26 DIAGNOSIS — J329 Chronic sinusitis, unspecified: Secondary | ICD-10-CM | POA: Diagnosis not present

## 2019-09-10 DIAGNOSIS — I10 Essential (primary) hypertension: Secondary | ICD-10-CM | POA: Diagnosis not present

## 2019-09-10 DIAGNOSIS — R3129 Other microscopic hematuria: Secondary | ICD-10-CM | POA: Diagnosis not present

## 2019-09-10 DIAGNOSIS — R809 Proteinuria, unspecified: Secondary | ICD-10-CM | POA: Diagnosis not present

## 2019-09-10 DIAGNOSIS — E119 Type 2 diabetes mellitus without complications: Secondary | ICD-10-CM | POA: Diagnosis not present

## 2019-10-31 DIAGNOSIS — E1165 Type 2 diabetes mellitus with hyperglycemia: Secondary | ICD-10-CM | POA: Diagnosis not present

## 2019-10-31 DIAGNOSIS — Z6839 Body mass index (BMI) 39.0-39.9, adult: Secondary | ICD-10-CM | POA: Diagnosis not present

## 2019-10-31 DIAGNOSIS — E8881 Metabolic syndrome: Secondary | ICD-10-CM | POA: Diagnosis not present

## 2019-12-02 DIAGNOSIS — K529 Noninfective gastroenteritis and colitis, unspecified: Secondary | ICD-10-CM | POA: Diagnosis not present

## 2019-12-03 ENCOUNTER — Ambulatory Visit: Payer: BC Managed Care – PPO | Attending: Internal Medicine

## 2019-12-03 ENCOUNTER — Other Ambulatory Visit: Payer: Self-pay

## 2019-12-03 DIAGNOSIS — Z20822 Contact with and (suspected) exposure to covid-19: Secondary | ICD-10-CM

## 2019-12-04 LAB — NOVEL CORONAVIRUS, NAA: SARS-CoV-2, NAA: NOT DETECTED

## 2019-12-05 ENCOUNTER — Emergency Department (HOSPITAL_COMMUNITY)
Admission: EM | Admit: 2019-12-05 | Discharge: 2019-12-05 | Disposition: A | Discharge status: Home / Self Care | Payer: BC Managed Care – PPO | Attending: Emergency Medicine | Admitting: Emergency Medicine

## 2019-12-05 ENCOUNTER — Other Ambulatory Visit: Payer: Self-pay

## 2019-12-05 ENCOUNTER — Emergency Department (HOSPITAL_COMMUNITY): Payer: BC Managed Care – PPO

## 2019-12-05 ENCOUNTER — Encounter (HOSPITAL_COMMUNITY): Payer: Self-pay

## 2019-12-05 DIAGNOSIS — Z7984 Long term (current) use of oral hypoglycemic drugs: Secondary | ICD-10-CM | POA: Diagnosis not present

## 2019-12-05 DIAGNOSIS — R1010 Upper abdominal pain, unspecified: Secondary | ICD-10-CM | POA: Diagnosis not present

## 2019-12-05 DIAGNOSIS — Z96652 Presence of left artificial knee joint: Secondary | ICD-10-CM | POA: Insufficient documentation

## 2019-12-05 DIAGNOSIS — I129 Hypertensive chronic kidney disease with stage 1 through stage 4 chronic kidney disease, or unspecified chronic kidney disease: Secondary | ICD-10-CM | POA: Diagnosis not present

## 2019-12-05 DIAGNOSIS — E1122 Type 2 diabetes mellitus with diabetic chronic kidney disease: Secondary | ICD-10-CM | POA: Insufficient documentation

## 2019-12-05 DIAGNOSIS — R112 Nausea with vomiting, unspecified: Secondary | ICD-10-CM | POA: Diagnosis not present

## 2019-12-05 DIAGNOSIS — Z79899 Other long term (current) drug therapy: Secondary | ICD-10-CM | POA: Insufficient documentation

## 2019-12-05 DIAGNOSIS — R1011 Right upper quadrant pain: Secondary | ICD-10-CM | POA: Diagnosis not present

## 2019-12-05 DIAGNOSIS — E876 Hypokalemia: Secondary | ICD-10-CM | POA: Diagnosis not present

## 2019-12-05 DIAGNOSIS — R1013 Epigastric pain: Secondary | ICD-10-CM | POA: Diagnosis not present

## 2019-12-05 DIAGNOSIS — N189 Chronic kidney disease, unspecified: Secondary | ICD-10-CM | POA: Insufficient documentation

## 2019-12-05 DIAGNOSIS — R109 Unspecified abdominal pain: Secondary | ICD-10-CM | POA: Diagnosis not present

## 2019-12-05 LAB — COMPREHENSIVE METABOLIC PANEL
ALT: 17 U/L (ref 0–44)
AST: 17 U/L (ref 15–41)
Albumin: 3.9 g/dL (ref 3.5–5.0)
Alkaline Phosphatase: 67 U/L (ref 38–126)
Anion gap: 9 (ref 5–15)
BUN: 12 mg/dL (ref 6–20)
CO2: 27 mmol/L (ref 22–32)
Calcium: 8.8 mg/dL — ABNORMAL LOW (ref 8.9–10.3)
Chloride: 102 mmol/L (ref 98–111)
Creatinine, Ser: 1.02 mg/dL — ABNORMAL HIGH (ref 0.44–1.00)
GFR calc Af Amer: >60 mL/min (ref >60)
GFR calc non Af Amer: >60 mL/min (ref >60)
Glucose, Bld: 179 mg/dL — ABNORMAL HIGH (ref 70–99)
Potassium: 2.9 mmol/L — ABNORMAL LOW (ref 3.5–5.1)
Sodium: 138 mmol/L (ref 135–145)
Total Bilirubin: 0.4 mg/dL (ref 0.3–1.2)
Total Protein: 7.6 g/dL (ref 6.5–8.1)

## 2019-12-05 LAB — URINALYSIS, ROUTINE W REFLEX MICROSCOPIC
Bilirubin Urine: NEGATIVE
Glucose, UA: NEGATIVE mg/dL
Hgb urine dipstick: NEGATIVE
Ketones, ur: NEGATIVE mg/dL
Leukocytes,Ua: NEGATIVE
Nitrite: NEGATIVE
Protein, ur: NEGATIVE mg/dL
Specific Gravity, Urine: >1.046 — ABNORMAL HIGH (ref 1.005–1.030)
pH: 6 (ref 5.0–8.0)

## 2019-12-05 LAB — CBC
HCT: 43.8 % (ref 36.0–46.0)
Hemoglobin: 13.6 g/dL (ref 12.0–15.0)
MCH: 28.6 pg (ref 26.0–34.0)
MCHC: 31.1 g/dL (ref 30.0–36.0)
MCV: 92 fL (ref 80.0–100.0)
Platelets: 406 10*3/uL — ABNORMAL HIGH (ref 150–400)
RBC: 4.76 MIL/uL (ref 3.87–5.11)
RDW: 13.2 % (ref 11.5–15.5)
WBC: 10.8 10*3/uL — ABNORMAL HIGH (ref 4.0–10.5)
nRBC: 0 % (ref 0.0–0.2)

## 2019-12-05 LAB — LIPASE, BLOOD: Lipase: 39 U/L (ref 11–51)

## 2019-12-05 MED ORDER — IOHEXOL 300 MG/ML  SOLN
100.0000 mL | Freq: Once | INTRAMUSCULAR | Status: AC | PRN
  Administered 2019-12-05: 100 mL via INTRAVENOUS

## 2019-12-05 MED ORDER — ONDANSETRON HCL 4 MG/2ML IJ SOLN
4.0000 mg | Freq: Once | INTRAMUSCULAR | Status: AC
  Administered 2019-12-05: 4 mg via INTRAVENOUS
  Filled 2019-12-05: qty 2

## 2019-12-05 MED ORDER — POTASSIUM CHLORIDE 10 MEQ/100ML IV SOLN
10.0000 meq | Freq: Once | INTRAVENOUS | Status: AC
  Administered 2019-12-05: 10 meq via INTRAVENOUS
  Filled 2019-12-05: qty 100

## 2019-12-05 MED ORDER — MORPHINE SULFATE (PF) 4 MG/ML IV SOLN
4.0000 mg | Freq: Once | INTRAVENOUS | Status: AC
  Filled 2019-12-05: qty 1

## 2019-12-05 MED ORDER — OXYCODONE-ACETAMINOPHEN 5-325 MG PO TABS: 1.0000 | ORAL_TABLET | Freq: Three times a day (TID) | ORAL | 0 refills | Status: DC | PRN

## 2019-12-05 MED ORDER — ONDANSETRON 4 MG PO TBDP: 4.0000 mg | ORAL_TABLET | Freq: Three times a day (TID) | ORAL | 0 refills | Status: DC | PRN

## 2019-12-05 MED ORDER — POTASSIUM CHLORIDE CRYS ER 20 MEQ PO TBCR
40.0000 meq | EXTENDED_RELEASE_TABLET | Freq: Once | ORAL | Status: AC
  Administered 2019-12-05: 40 meq via ORAL
  Filled 2019-12-05: qty 2

## 2019-12-05 MED ORDER — POTASSIUM CHLORIDE ER 20 MEQ PO TBCR: 20.0000 meq | EXTENDED_RELEASE_TABLET | Freq: Two times a day (BID) | ORAL | 0 refills | Status: DC

## 2019-12-05 MED ORDER — MORPHINE SULFATE (PF) 4 MG/ML IV SOLN: 4.0000 mg | Freq: Once | INTRAVENOUS | Status: DC

## 2019-12-05 MED ORDER — MORPHINE SULFATE (PF) 4 MG/ML IV SOLN
INTRAVENOUS | Status: AC
  Administered 2019-12-05: 4 mg via INTRAVENOUS
  Filled 2019-12-05: qty 1

## 2019-12-05 MED ORDER — SODIUM CHLORIDE 0.9 % IV BOLUS
1000.0000 mL | Freq: Once | INTRAVENOUS | Status: AC
  Administered 2019-12-05: 1000 mL via INTRAVENOUS

## 2019-12-05 MED ORDER — SODIUM CHLORIDE 0.9% FLUSH
3.0000 mL | Freq: Once | INTRAVENOUS | Status: AC
  Administered 2019-12-05: 3 mL via INTRAVENOUS

## 2019-12-05 MED ORDER — FENTANYL CITRATE (PF) 100 MCG/2ML IJ SOLN
50.0000 ug | Freq: Once | INTRAMUSCULAR | Status: AC
  Administered 2019-12-05: 50 ug via INTRAVENOUS
  Filled 2019-12-05: qty 2

## 2019-12-05 NOTE — ED Notes (Signed)
Defective Morphine vial. Have wasted with Tsosie Billing RN. Will put in another order for Morphine and draw from pyxis

## 2019-12-05 NOTE — ED Notes (Signed)
Patient taken to CT scan.

## 2019-12-05 NOTE — ED Notes (Signed)
Patient told to call for ultrasound to be scheduled. Discharge instructions reviewed with instructions.

## 2019-12-05 NOTE — ED Notes (Signed)
Patient given oral fluids.

## 2019-12-05 NOTE — ED Triage Notes (Signed)
Pt presents to ED with abdominal cramping, vomiting and diarrhea started last Friday.

## 2019-12-05 NOTE — Progress Notes (Signed)
[facility]  Would admit. Npo Repeat labs in am. [place] to assess gallbladder further. Will see in am and determine if needs surgery tomorrow.  Algis Greenhouse, MD

## 2019-12-05 NOTE — Discharge Instructions (Addendum)
IMPORTANT PATIENT INSTRUCTIONS:  Your ED provider has recommended an Outpatient Ultrasound.  Please call [phone] to schedule an appointment.  If your appointment is scheduled for a Saturday, Sunday or holiday, please go to the [facility] Emergency Department Registration Desk at least 15 minutes prior to your appointment time and tell them you are there for an ultrasound.    If your appointment is scheduled for a weekday (Monday-Friday), please go directly to the [facility] Radiology Department at least 15 minutes prior to your appointment time and tell them you are there for an ultrasound.  Please call [phone] with questions.   As we discussed, you will need an ultrasound.  I have put in an order for you.  Call their office tomorrow to arrange for an ultrasound appointment.  Take Zofran as needed.  Return to emergency department for any worsening pain, vomiting, fevers.

## 2019-12-05 NOTE — ED Provider Notes (Signed)
[facility] Provider Note   CSN: [id number] Arrival date & time: 12/05/19  1539     History Chief Complaint  Patient presents with  . Abdominal Pain    Victoria Williams is a 54 y.o. female possible history of Bell's palsy, chronic kidney disease, diabetes, esophagitis, Henoch-Schnlein purpura, kidney stones who presents for evaluation of 5 days of generalized abdominal pain, nausea, diarrhea.  She states that at onset of symptoms, she started having sharp, cramping generalized abdominal pain.  She states that she had nausea and was not able to tolerate much food.  She has not had any vomiting.  She reports multiple episodes of diarrhea that are loose in nature.  No blood.  She estimates about 8-9 episodes a day.  She states that the diarrhea has improved.  She states that she continues to have nausea, generalized abdominal pain.  She had a virtual visit with her doctor who advised her to go to the emergency department for further evaluation.  She states that occasionally, she will feel the pain in her epigastric abdominal shoots up to towards her right shoulder but otherwise no other chest pain, difficulty breathing.  She has not noted any fevers, cough, dysuria, hematuria.  No recent travel.  No recent antibiotic.  No recent known COVID-19 exposure.  The history is provided by the patient.       Past Medical History:  Diagnosis Date  . Arthritis   . Bell's palsy    approx 2015  . Chronic kidney disease   . Diabetes mellitus    only when on steriods  . Esophagitis 2012   EGD  . Henoch-Schonlein purpura ([place])   . History of kidney stones   . History of UTI   . Hypertension   . IBS (irritable bowel syndrome)   . Obesity     Patient Active Problem List   Diagnosis Date Noted  . Facial paralysis/Bells palsy 01/27/2014  . Facial droop 01/26/2014  . Facial weakness 01/26/2014  . Bloating 09/30/2013  . Loss of weight 09/30/2013  . Abdominal pain, other  specified site 09/30/2013  . Abdominal pain, epigastric 02/10/2011  . Henoch-Schonlein purpura ([place]) 02/10/2011  . Diabetes mellitus without mention of complication 40/98/1191    Past Surgical History:  Procedure Laterality Date  . ABDOMINAL HYSTERECTOMY  2003  . ACHILLES TENDON SURGERY    . CESAREAN SECTION    . KNEE SURGERY     arthroscopic  . NECK SURGERY  2000   cervical fusion 6/7  . REPLACEMENT TOTAL KNEE Left 2019   [facility]  . TONSILECTOMY, ADENOIDECTOMY, BILATERAL MYRINGOTOMY AND TUBES    . TUBAL LIGATION  1996     OB History   No obstetric history on file.     Family History  Problem Relation Age of Onset  . Leukemia Mother   . Diabetes Mother   . Breast cancer Mother   . Esophageal cancer Brother   . Diabetes Maternal Aunt   . Diabetes Maternal Uncle   . Diabetes Maternal Grandfather   . Heart attack Father   . Colon cancer Neg Hx     Social History   Tobacco Use  . Smoking status: Never Smoker  . Smokeless tobacco: Never Used  Substance Use Topics  . Alcohol use: Yes    Alcohol/week: 1.0 standard drinks    Types: 1 Glasses of wine per week    Comment: 2 a month   . Drug use: No  Home Medications Prior to Admission medications   Medication Sig Start Date End Date Taking? Authorizing Provider  acetaminophen (TYLENOL) 500 MG tablet Take 1,000 mg by mouth 2 (two) times daily as needed for headache.   Yes [provider]  calcium carbonate (TUMS - DOSED IN MG ELEMENTAL CALCIUM) 500 MG chewable tablet Chew 1 tablet by mouth daily as needed for indigestion or heartburn.    Yes [provider]  dicyclomine (BENTYL) 10 MG capsule Take 10 mg by mouth 4 (four) times daily -  before meals and at bedtime.  03/14/18  Yes [provider]  glipiZIDE (GLUCOTROL) 5 MG tablet Take 5 mg by mouth 2 (two) times daily. 11/01/19  Yes [provider]  ibuprofen (ADVIL,MOTRIN) 200 MG tablet Take 600 mg by mouth 2 (two) times daily as  needed for moderate pain.   Yes [provider]  losartan-hydrochlorothiazide (HYZAAR) 100-25 MG tablet Take 1 tablet by mouth daily.    Yes [provider]  tolterodine (DETROL) 2 MG tablet Take 2 mg by mouth 2 (two) times daily.  04/02/18  Yes [provider]  TRULICITY 1.5 MG/0.5ML SOPN Inject 1.5 mg into the skin every Wednesday.  11/27/19  Yes [provider]  ondansetron (ZOFRAN ODT) 4 MG disintegrating tablet Take 1 tablet (4 mg total) by mouth every 8 (eight) hours as needed for nausea or vomiting. 12/05/19   Maxwell Caul, PA-C  oxyCODONE-acetaminophen (PERCOCET/ROXICET) 5-325 MG tablet Take 1-2 tablets by mouth every 8 (eight) hours as needed for severe pain. 12/05/19   Maxwell Caul, PA-C  potassium chloride 20 MEQ TBCR Take 20 mEq by mouth 2 (two) times daily for 3 days. 12/05/19 12/08/19  Maxwell Caul, PA-C    Allergies    Hydromorphone hcl, Sulfa antibiotics, Cephalexin, Ciprocin-fluocin-procin [fluocinolone acetonide], Bee venom, Levofloxacin, Celebrex [celecoxib], and Penicillins  Review of Systems   Review of Systems  Constitutional: Negative for fever.  Respiratory: Negative for cough and shortness of breath.   Cardiovascular: Negative for chest pain.  Gastrointestinal: Positive for abdominal pain, diarrhea and nausea. Negative for vomiting.  Genitourinary: Negative for dysuria and hematuria.  Neurological: Negative for headaches.  All other systems reviewed and are negative.   Physical Exam Updated Vital Signs BP 94/60 (BP Location: Left Arm)   Pulse (!) 40   Temp 98.4 F (36.9 C) (Oral)   Resp 19   Ht 5\' 4"  (1.626 m)   Wt 99.8 kg   SpO2 98%   BMI 37.76 kg/m   Physical Exam Vitals and nursing note reviewed.  Constitutional:      Appearance: Normal appearance. She is well-developed.  HENT:     Head: Normocephalic and atraumatic.  Eyes:     General: Lids are normal.     Conjunctiva/sclera: Conjunctivae normal.      Pupils: Pupils are equal, round, and reactive to light.  Cardiovascular:     Rate and Rhythm: Normal rate and regular rhythm.     Pulses: Normal pulses.     Heart sounds: Normal heart sounds. No murmur. No friction rub. No gallop.   Pulmonary:     Effort: Pulmonary effort is normal.     Breath sounds: Normal breath sounds.     Comments: Lungs clear to auscultation bilaterally.  Symmetric chest rise.  No wheezing, rales, rhonchi. Abdominal:     Palpations: Abdomen is soft. Abdomen is not rigid.     Tenderness: There is generalized abdominal tenderness and tenderness in the  right upper quadrant, epigastric area and left upper quadrant. There is no guarding. Positive signs include Murphy's sign.     Comments: Abdomen is soft, nondistended.  Generalized tenderness noted throughout but worse in the upper abdomen.  Positive Murphy sign.  No tenderness noted at McBurney's point.  Musculoskeletal:        General: Normal range of motion.     Cervical back: Full passive range of motion without pain.  Skin:    General: Skin is warm and dry.     Capillary Refill: Capillary refill takes less than 2 seconds.  Neurological:     Mental Status: She is alert and oriented to person, place, and time.  Psychiatric:        Speech: Speech normal.     ED Results / Procedures / Treatments   Labs (all labs ordered are listed, but only abnormal results are displayed) Labs Reviewed  CBC - Abnormal; Notable for the following components:      Result Value   WBC 10.8 (*)    Platelets 406 (*)    All other components within normal limits  URINALYSIS, ROUTINE W REFLEX MICROSCOPIC - Abnormal; Notable for the following components:   Specific Gravity, Urine >1.046 (*)    All other components within normal limits  COMPREHENSIVE METABOLIC PANEL - Abnormal; Notable for the following components:   Potassium 2.9 (*)    Glucose, Bld 179 (*)    Creatinine, Ser 1.02 (*)    Calcium 8.8 (*)    All other components  within normal limits  LIPASE, BLOOD    EKG None  Radiology CT ABDOMEN PELVIS W CONTRAST  Result Date: 12/05/2019 CLINICAL DATA:  Abdominal pain with nausea beginning about 1 week ago. EXAM: CT ABDOMEN AND PELVIS WITH CONTRAST TECHNIQUE: Multidetector CT imaging of the abdomen and pelvis was performed using the standard protocol following bolus administration of intravenous contrast. CONTRAST:  OMNIPAQUE IOHEXOL 300 MG/ML  SOLN COMPARISON:  Ultrasound 05/26/2015.  Abdomen CT 04/04/2012 FINDINGS: Lower chest: Scarring and bronchiectasis in both lower lobes, worsened since the study of 2013. cannot rule out an element of active inflammatory disease at the lung bases. No pleural effusion. Hepatobiliary: Liver parenchyma is normal. There is a gradient differential within the gallbladder that could indicate the presence of sludge or stones. No evidence of gross gallbladder inflammation. Consider abdominal ultrasound. Pancreas: Normal Spleen: Normal Adrenals/Urinary Tract: Adrenal glands are normal. Kidneys are in normal with exception of a 3 mm nonobstructing stone in the lower pole of the left kidney. No hydronephrosis. Bladder is normal. Stomach/Bowel: No acute or significant bowel finding. No evidence of obstruction. No sign of inflammatory disease. Vascular/Lymphatic: Aortic atherosclerosis. No aneurysm. IVC is normal. No retroperitoneal adenopathy. Reproductive: Previous hysterectomy.  No pelvic mass. Other: No free fluid or air. Musculoskeletal: Ordinary mild lumbar degenerative changes. IMPRESSION: Density gradient within the gallbladder could indicate the presence of sludge or stones. Consider abdominal ultrasound. No CT evidence of gallbladder inflammation or biliary ductal obstruction however. Nonobstructing 3 mm stone in the lower pole of the left kidney. Aortic atherosclerosis. Electronically Signed   By: Paulina Fusi M.D.   On: 12/05/2019 19:43    Procedures Procedures (including critical  care time)  Medications Ordered in ED Medications  sodium chloride flush (NS) 0.9 % injection 3 mL (3 mLs Intravenous Given 12/05/19 1822)  ondansetron (ZOFRAN) injection 4 mg (4 mg Intravenous Given 12/05/19 1804)  morphine 4 MG/ML injection 4 mg (4 mg Intravenous Given 12/05/19 1820)  sodium chloride 0.9 % bolus 1,000 mL (0 mLs Intravenous Stopped 12/05/19 1949)  potassium chloride 10 mEq in 100 mL IVPB (0 mEq Intravenous Stopped 12/05/19 2027)  iohexol (OMNIPAQUE) 300 MG/ML solution 100 mL (100 mLs Intravenous Contrast Given 12/05/19 1915)  fentaNYL (SUBLIMAZE) injection 50 mcg (50 mcg Intravenous Given 12/05/19 2033)  ondansetron (ZOFRAN) injection 4 mg (4 mg Intravenous Given 12/05/19 2033)  sodium chloride 0.9 % bolus 1,000 mL (0 mLs Intravenous Stopped 12/05/19 2137)  potassium chloride SA (KLOR-CON) CR tablet 40 mEq (40 mEq Oral Given 12/05/19 2144)    ED Course  I have reviewed the triage vital signs and the nursing notes.  Pertinent labs & imaging results that were available during my care of the patient were reviewed by me and considered in my medical decision making (see chart for details).    MDM Rules/Calculators/A&P                      54 year old female who presents for evaluation of generalized abdominal pain, nausea, diarrhea.  No fevers.  No vomiting.  Initially arrival, she is afebrile, nontoxic-appearing.  Vital signs are stable though her blood pressure is little bit soft.  She has generalized abdominal tenderness with slightly worsening of her abdomen.  Consider viral GI process versus hepatobiliary etiology. Patient does report she has had right upper quadrant pain before but states this feels slightly worse.  Plan to check labs, fluids.  I reviewed her records.  She had had epigastric and right upper quadrant abdominal pain previously.  Her ultrasound never showed any gallbladder abnormalities.  CBC shows slight leukocytosis of 10.8.  Lipase is unremarkable.  CMP shows  hypokalemia with potassium of 2.9.  BUN and creatinine are 12/1.02.   CT scan shows density gradient within the gallbladder could indicate the presence of sludge or stones.  Recommend abdominal ultrasound.  She has a nonobstructing 3 mm stone in the left lower pole of the kidney.  No other acute abnormalities.  Discussed results with patient.  She states she is still having pain after pain medications.  Repeat abdominal exam shows she still tender in epigastric and right upper quadrant area.  We will plan for repeat meds.  Discussed with Dr. Henreitta Leber (general surgery) regarding patient's findings and exam.  She recommends if patient's pain cannot be controlled she cannot tolerate p.o. then admit her to medicine with plans for an ultrasound tomorrow and surgical consult as needed.  I discussed plan with patient.  Patient does report feeling slightly better.  She was to try additional pain medication and determine if she wants to be admitted or come back for ultrasound.  Reevaluation after additional analgesics.  Patient reports improvement in pain.  She has been able to tolerate p.o.  Discussed at length regarding options.  I did offer admission with plans for pain control, observation ultrasound in the morning.  Additionally, we discussed returning to the hospital to get her ultrasound.  I discussed risk versus benefits of each option.  After extensive discussion, patient would like to go home as she feels better and has been able to tolerate p.o.  She understands that she is to return if she has any worsening or concerning symptoms.  We will plan to schedule her an outpatient ultrasound. At this time, patient exhibits no emergent life-threatening condition that require further evaluation in ED or admission. Patient had ample opportunity for questions and discussion. All patient's questions were answered with full understanding. Strict return  precautions discussed. Patient expresses understanding and  agreement to plan.    Portions of this note were generated with Scientist, clinical (histocompatibility and immunogenetics). Dictation errors may occur despite best attempts at proofreading.   Final Clinical Impression(s) / ED Diagnoses Final diagnoses:  Pain of upper abdomen  Hypokalemia    Rx / DC Orders ED Discharge Orders         Ordered    US Abdomen Limited RUQ/Gall Gladder     12/05/19 2119    ondansetron (ZOFRAN ODT) 4 MG disintegrating tablet  Every 8 hours PRN     12/05/19 2123    oxyCODONE-acetaminophen (PERCOCET/ROXICET) 5-325 MG tablet  Every 8 hours PRN     12/05/19 2123    potassium chloride 20 MEQ TBCR  2 times daily     12/05/19 2125           Rosana Hoes 12/06/19 1106    Bethann Berkshire, MD 12/06/19 1505

## 2019-12-11 ENCOUNTER — Other Ambulatory Visit: Payer: Self-pay

## 2019-12-11 ENCOUNTER — Ambulatory Visit (HOSPITAL_COMMUNITY)
Admission: RE | Admit: 2019-12-11 | Discharge: 2019-12-11 | Disposition: A | Discharge status: Home / Self Care | Payer: BC Managed Care – PPO | Source: Ambulatory Visit | Attending: Emergency Medicine | Admitting: Emergency Medicine

## 2019-12-11 DIAGNOSIS — K760 Fatty (change of) liver, not elsewhere classified: Secondary | ICD-10-CM | POA: Diagnosis not present

## 2019-12-11 DIAGNOSIS — R1010 Upper abdominal pain, unspecified: Secondary | ICD-10-CM

## 2019-12-11 DIAGNOSIS — K828 Other specified diseases of gallbladder: Secondary | ICD-10-CM | POA: Diagnosis not present

## 2019-12-17 ENCOUNTER — Encounter: Payer: Self-pay | Admitting: General Surgery

## 2019-12-17 ENCOUNTER — Ambulatory Visit (INDEPENDENT_AMBULATORY_CARE_PROVIDER_SITE_OTHER): Payer: BC Managed Care – PPO | Admitting: General Surgery

## 2019-12-17 ENCOUNTER — Other Ambulatory Visit: Payer: Self-pay

## 2019-12-17 VITALS — BP 105/67 | HR 87 | Temp 98.3°F | Resp 14 | Ht 64.0 in | Wt 220.6 lb

## 2019-12-17 DIAGNOSIS — K828 Other specified diseases of gallbladder: Secondary | ICD-10-CM

## 2019-12-17 MED ORDER — ONDANSETRON 4 MG PO TBDP: 4.0000 mg | ORAL_TABLET | Freq: Three times a day (TID) | ORAL | 1 refills | Status: DC | PRN

## 2019-12-17 NOTE — Progress Notes (Signed)
[facility] History and Physical  Reason for Referral: Gallbladder sludge  Referring Physician: ED   Chief Complaint    New Patient (Initial Visit)      Victoria Williams is a 54 y.o. female.  HPI: Victoria Williams is a 54 yo with complaints of RUQ and epigastric pain and associated nausea/vomiting and diarrhea. The pain is crampy in nature but does not radiate to her back.  Victoria Williams said Victoria Williams had this going on for a few days prior to going to the ED on 12/05/2019. Victoria Williams was seen in the Ed and found to have normal labs and a CT was done that demonstrated a distended gallbladder but no obvious stones. Victoria Williams was ultimately able to have pain control and tolerate some po intake. Victoria Williams came back to the hospital and got a US of the gallbladder that demonstrated gallbladder sludge.  Victoria Williams says Victoria Williams has been having some pain off and on, and says that Victoria Williams still been having some diarrhea. Victoria Williams says since the ED Victoria Williams has had some nausea off and on and used zofran.   Past Medical History:  Diagnosis Date  . Arthritis   . Bell's palsy    approx 2015  . Chronic kidney disease   . Diabetes mellitus    only when on steriods  . Esophagitis 2012   EGD  . Henoch-Schonlein purpura (HCC)   . History of kidney stones   . History of UTI   . Hypertension   . IBS (irritable bowel syndrome)   . Obesity     Past Surgical History:  Procedure Laterality Date  . ABDOMINAL HYSTERECTOMY  2003  . ACHILLES TENDON SURGERY    . CESAREAN SECTION    . KNEE SURGERY     arthroscopic  . NECK SURGERY  2000   cervical fusion 6/7  . REPLACEMENT TOTAL KNEE Left 2019   [facility]  . TONSILECTOMY, ADENOIDECTOMY, BILATERAL MYRINGOTOMY AND TUBES    . TUBAL LIGATION  1996    Family History  Problem Relation Age of Onset  . Leukemia Mother   . Diabetes Mother   . Breast cancer Mother   . Esophageal cancer Brother   . Diabetes Maternal Aunt   . Diabetes Maternal Uncle   . Diabetes Maternal Grandfather   . Heart attack  Father   . Colon cancer Neg Hx     Social History   Tobacco Use  . Smoking status: Never Smoker  . Smokeless tobacco: Never Used  Substance Use Topics  . Alcohol use: Yes    Alcohol/week: 1.0 standard drinks    Types: 1 Glasses of wine per week    Comment: 2 a month   . Drug use: No    Medications: I have reviewed the patient's current medications. Allergies as of 12/17/2019      Reactions   Hydromorphone Hcl Nausea And Vomiting   Sulfa Antibiotics Nausea And Vomiting   Cephalexin Hives   Ciprocin-fluocin-procin [fluocinolone Acetonide] Hives   Bee Venom Swelling   REACTION: Severe swelling   Levofloxacin Hives   Celebrex [celecoxib] Rash   itching   Penicillins Rash   Did it involve swelling of the face/tongue/throat, SOB, or low BP? No Did it involve sudden or severe rash/hives, skin peeling, or any reaction on the inside of your mouth or nose? Yes Did you need to seek medical attention at a hospital or doctor's office? Unknown When did it last happen? childhood If all above answers are "NO", may proceed with  cephalosporin use.      Medication List       Accurate as of December 17, 2019 10:46 AM. If you have any questions, ask your nurse or doctor.        STOP taking these medications   tolterodine 2 MG tablet Commonly known as: DETROL Stopped by: Lucretia Roers, MD     TAKE these medications   acetaminophen 500 MG tablet Commonly known as: TYLENOL Take 1,000 mg by mouth 2 (two) times daily as needed for headache.   calcium carbonate 500 MG chewable tablet Commonly known as: TUMS - dosed in mg elemental calcium Chew 1 tablet by mouth daily as needed for indigestion or heartburn.   dicyclomine 10 MG capsule Commonly known as: BENTYL Take 10 mg by mouth 4 (four) times daily -  before meals and at bedtime.   glipiZIDE 5 MG tablet Commonly known as: GLUCOTROL Take 5 mg by mouth 2 (two) times daily.   ibuprofen 200 MG tablet Commonly known as:  ADVIL Take 600 mg by mouth 2 (two) times daily as needed for moderate pain.   losartan-hydrochlorothiazide 100-25 MG tablet Commonly known as: HYZAAR Take 1 tablet by mouth daily.   ondansetron 4 MG disintegrating tablet Commonly known as: Zofran ODT Take 1 tablet (4 mg total) by mouth every 8 (eight) hours as needed for nausea or vomiting.   oxyCODONE-acetaminophen 5-325 MG tablet Commonly known as: PERCOCET/ROXICET Take 1-2 tablets by mouth every 8 (eight) hours as needed for severe pain.   Potassium Chloride ER 20 MEQ Tbcr Take 20 mEq by mouth 2 (two) times daily for 3 days.   Trulicity 1.5 MG/0.5ML Sopn Generic drug: Dulaglutide Inject 1.5 mg into the skin every Wednesday.        ROS:  A comprehensive review of systems was negative except for: Gastrointestinal: positive for abdominal pain, diarrhea, reflux symptoms and vomiting Musculoskeletal: positive for back pain and joint pain Neurological: positive for headaches  Blood pressure 105/67, pulse 87, temperature 98.3 F (36.8 C), temperature source Oral, resp. rate 14, height 5\' 4"  (1.626 m), weight 220 lb 9.6 oz (100.1 kg), SpO2 96 %. Physical Exam Vitals reviewed.  HENT:     Head: Normocephalic.     Nose: Nose normal.     Mouth/Throat:     Mouth: Mucous membranes are moist.  Eyes:     Extraocular Movements: Extraocular movements intact.     Pupils: Pupils are equal, round, and reactive to light.  Cardiovascular:     Rate and Rhythm: Normal rate and regular rhythm.  Pulmonary:     Effort: Pulmonary effort is normal.     Breath sounds: Normal breath sounds.  Abdominal:     General: There is no distension.     Palpations: Abdomen is soft.     Tenderness: There is abdominal tenderness in the right upper quadrant.  Musculoskeletal:        General: No swelling. Normal range of motion.     Cervical back: Normal range of motion. No rigidity.  Skin:    General: Skin is warm and dry.  Neurological:     General:  No focal deficit present.     Mental Status: Victoria Williams is alert and oriented to person, place, and time.  Psychiatric:        Mood and Affect: Mood normal.        Behavior: Behavior normal.        Thought Content: Thought content normal.  Judgment: Judgment normal.     Results: Personally reviewed CT and [place]- distended gallbladder, layering sludge, no thickening of wall CT a/p 12/05/2019 CLINICAL DATA:  Abdominal pain with nausea beginning about 1 week ago.  EXAM: CT ABDOMEN AND PELVIS WITH CONTRAST  TECHNIQUE: Multidetector CT imaging of the abdomen and pelvis was performed using the standard protocol following bolus administration of intravenous contrast.  CONTRAST:  OMNIPAQUE IOHEXOL 300 MG/ML  SOLN  COMPARISON:  Ultrasound 05/26/2015.  Abdomen CT 04/04/2012  FINDINGS: Lower chest: Scarring and bronchiectasis in both lower lobes, worsened since the study of 2013. cannot rule out an element of active inflammatory disease at the lung bases. No pleural effusion.  Hepatobiliary: Liver parenchyma is normal. There is a gradient differential within the gallbladder that could indicate the presence of sludge or stones. No evidence of gross gallbladder inflammation. Consider abdominal ultrasound.  Pancreas: Normal  Spleen: Normal  Adrenals/Urinary Tract: Adrenal glands are normal. Kidneys are in normal with exception of a 3 mm nonobstructing stone in the lower pole of the left kidney. No hydronephrosis. Bladder is normal.  Stomach/Bowel: No acute or significant bowel finding. No evidence of obstruction. No sign of inflammatory disease.  Vascular/Lymphatic: Aortic atherosclerosis. No aneurysm. IVC is normal. No retroperitoneal adenopathy.  Reproductive: Previous hysterectomy.  No pelvic mass.  Other: No free fluid or air.  Musculoskeletal: Ordinary mild lumbar degenerative changes.  IMPRESSION: Density gradient within the gallbladder could  indicate the presence of sludge or stones. Consider abdominal ultrasound. No CT evidence of gallbladder inflammation or biliary ductal obstruction however.  Nonobstructing 3 mm stone in the lower pole of the left kidney.  Aortic atherosclerosis.   Electronically Signed   By: Paulina Fusi M.D.   On: 12/05/2019 19:43  CLINICAL DATA:  Abdominal pain for 1 week with diarrhea  EXAM: ULTRASOUND ABDOMEN LIMITED RIGHT UPPER QUADRANT  COMPARISON:  12/05/2019  FINDINGS: Gallbladder:  Gallbladder is well distended with echogenic material within consistent with gallbladder sludge. No gallstones are seen. No pericholecystic fluid is noted.  Common bile duct:  Diameter: 5.2 mm at the upper limits of normal in size.  Liver:  Mildly heterogeneous and increased in echogenicity consistent with fatty infiltration. No focal mass is noted. Portal vein is patent on color Doppler imaging with normal direction of blood flow towards the liver.  Other: None.  IMPRESSION: Gallbladder sludge.  Fatty liver.   Electronically Signed   By: Alcide Clever M.D.   On: 12/11/2019 09:43  Assessment & Plan:  ALEZANDRA EGLI is a 54 y.o. female with gallbladder sludge, biliary colic symptoms. Victoria Williams has continued to have pain and nausea but no recent vomiting. Victoria Williams says Victoria Williams also has diarrhea. Warned her that not all of her symptoms are likely caused by the gallbladder and that the diarrhea could be more related to irritable bowel syndrome. Also warned that gallbladder surgery can make stools looser.   -PLAN: I counseled the patient about the indication, risks and benefits of laparoscopic cholecystectomy.  Victoria Williams understands there is a very small chance for bleeding, infection, injury to normal structures (including common bile duct), conversion to open surgery, persistent symptoms, evolution of postcholecystectomy diarrhea, need for secondary interventions, anesthesia reaction, cardiopulmonary  issues and other risks not specifically detailed here. I described the expected recovery, the plan for follow-up and the restrictions during the recovery phase.  All questions were answered.  Zofran prescribed due to nausea.    All questions were answered to the satisfaction of the patient.  Virl Cagey 12/17/2019, 10:46 AM

## 2019-12-17 NOTE — Patient Instructions (Signed)
Cholelithiasis  Cholelithiasis is also called "gallstones." It is a kind of gallbladder disease. The gallbladder is an organ that stores a liquid (bile) that helps you digest fat. Gallstones may not cause symptoms (may be silent gallstones) until they cause a blockage, and then they can cause pain (gallbladder attack). Follow these instructions at home:  Take over-the-counter and prescription medicines only as told by your doctor.  Stay at a healthy weight.  Eat healthy foods. This includes: ? Eating fewer fatty foods, like fried foods. ? Eating fewer refined carbs (refined carbohydrates). Refined carbs are breads and grains that are highly processed, like white bread and white rice. Instead, choose whole grains like whole-wheat bread and brown rice. ? Eating more fiber. Almonds, fresh fruit, and beans are healthy sources of fiber.  Keep all follow-up visits as told by your doctor. This is important. Contact a doctor if:  You have sudden pain in the upper right side of your belly (abdomen). Pain might spread to your right shoulder or your chest. This may be a sign of a gallbladder attack.  You feel sick to your stomach (are nauseous).  You throw up (vomit).  You have been diagnosed with gallstones that have no symptoms and you get: ? Belly pain. ? Discomfort, burning, or fullness in the upper part of your belly (indigestion). Get help right away if:  You have sudden pain in the upper right side of your belly, and it lasts for more than 2 hours.  You have belly pain that lasts for more than 5 hours.  You have a fever or chills.  You keep feeling sick to your stomach or you keep throwing up.  Your skin or the whites of your eyes turn yellow (jaundice).  You have dark-colored pee (urine).  You have light-colored poop (stool). Summary  Cholelithiasis is also called "gallstones."  The gallbladder is an organ that stores a liquid (bile) that helps you digest fat.  Silent  gallstones are gallstones that do not cause symptoms.  A gallbladder attack may cause sudden pain in the upper right side of your belly. Pain might spread to your right shoulder or your chest. If this happens, contact your doctor.  If you have sudden pain in the upper right side of your belly that lasts for more than 2 hours, get help right away. This information is not intended to replace advice given to you by your health care provider. Make sure you discuss any questions you have with your health care provider. Document Revised: 09/22/2017 Document Reviewed: 06/26/2016 Elsevier Patient Education  2020 [employer]    Laparoscopic Cholecystectomy Laparoscopic cholecystectomy is surgery to remove the gallbladder. The gallbladder is a pear-shaped organ that lies beneath the liver on the right side of the body. The gallbladder stores bile, which is a fluid that helps the body to digest fats. Cholecystectomy is often done for inflammation of the gallbladder (cholecystitis). This condition is usually caused by a buildup of gallstones (cholelithiasis) in the gallbladder. Gallstones can block the flow of bile, which can result in inflammation and pain. In severe cases, emergency surgery may be required. This procedure is done though small incisions in your abdomen (laparoscopic surgery). A thin scope with a camera (laparoscope) is inserted through one incision. Thin surgical instruments are inserted through the other incisions. In some cases, a laparoscopic procedure may be turned into a type of surgery that is done through a larger incision (open surgery). Tell a health care provider about:  Any   allergies you have.  All medicines you are taking, including vitamins, herbs, eye drops, creams, and over-the-counter medicines.  Any problems you or family members have had with anesthetic medicines.  Any blood disorders you have.  Any surgeries you have had.  Any medical conditions you  have.  Whether you are pregnant or may be pregnant. What are the risks? Generally, this is a safe procedure. However, problems may occur, including:  Infection.  Bleeding.  Allergic reactions to medicines.  Damage to other structures or organs.  A stone remaining in the common bile duct. The common bile duct carries bile from the gallbladder into the small intestine.  A bile leak from the cyst duct that is clipped when your gallbladder is removed. Medicines  Ask your health care provider about: ? Changing or stopping your regular medicines. This is especially important if you are taking diabetes medicines or blood thinners. ? Taking medicines such as aspirin and ibuprofen. These medicines can thin your blood. Do not take these medicines before your procedure if your health care provider instructs you not to.  You may be given antibiotic medicine to help prevent infection. General instructions  Let your health care provider know if you develop a cold or an infection before surgery.  Plan to have someone take you home from the hospital or clinic.  Ask your health care provider how your surgical site will be marked or identified. What happens during the procedure?   To reduce your risk of infection: ? Your health care team will wash or sanitize their hands. ? Your skin will be washed with soap. ? Hair may be removed from the surgical area.  An IV tube may be inserted into one of your veins.  You will be given one or more of the following: ? A medicine to help you relax (sedative). ? A medicine to make you fall asleep (general anesthetic).  A breathing tube will be placed in your mouth.  Your surgeon will make several small cuts (incisions) in your abdomen.  The laparoscope will be inserted through one of the small incisions. The camera on the laparoscope will send images to a TV screen (monitor) in the operating room. This lets your surgeon see inside your  abdomen.  Air-like gas will be pumped into your abdomen. This will expand your abdomen to give the surgeon more room to perform the surgery.  Other tools that are needed for the procedure will be inserted through the other incisions. The gallbladder will be removed through one of the incisions.  Your common bile duct may be examined. If stones are found in the common bile duct, they may be removed.  After your gallbladder has been removed, the incisions will be closed with stitches (sutures), staples, or skin glue.  Your incisions may be covered with a bandage (dressing). The procedure may vary among health care providers and hospitals. What happens after the procedure?  Your blood pressure, heart rate, breathing rate, and blood oxygen level will be monitored until the medicines you were given have worn off.  You will be given medicines as needed to control your pain.  Do not drive for 24 hours if you were given a sedative. This information is not intended to replace advice given to you by your health care provider. Make sure you discuss any questions you have with your health care provider. Document Revised: 09/22/2017 Document Reviewed: 03/28/2016 Elsevier Patient Education  2020 [employer]  

## 2019-12-18 NOTE — H&P (Signed)
[facility] History and Physical  Reason for Referral: Gallbladder sludge  Referring Physician: ED     Chief Complaint     New Patient (Initial Visit)      Victoria Williams is a 55 y.o. female.  HPI: Victoria Williams is a 54 yo with complaints of RUQ and epigastric pain and associated nausea/vomiting and diarrhea. The pain is crampy in nature but does not radiate to her back. She said she had this going on for a few days prior to going to the ED on 12/05/2019. She was seen in the Ed and found to have normal labs and a CT was done that demonstrated a distended gallbladder but no obvious stones. She was ultimately able to have pain control and tolerate some po intake. She came back to the hospital and got a US of the gallbladder that demonstrated gallbladder sludge.  She says she has been having some pain off and on, and says that she still been having some diarrhea. She says since the ED she has had some nausea off and on and used zofran.      Past Medical History:  Diagnosis Date  . Arthritis   . Bell's palsy    approx 2015  . Chronic kidney disease   . Diabetes mellitus    only when on steriods  . Esophagitis 2012   EGD  . Henoch-Schonlein purpura (HCC)   . History of kidney stones   . History of UTI   . Hypertension   . IBS (irritable bowel syndrome)   . Obesity         Past Surgical History:  Procedure Laterality Date  . ABDOMINAL HYSTERECTOMY  2003  . ACHILLES TENDON SURGERY    . CESAREAN SECTION    . KNEE SURGERY     arthroscopic  . NECK SURGERY  2000   cervical fusion 6/7  . REPLACEMENT TOTAL KNEE Left 2019   [facility]  . TONSILECTOMY, ADENOIDECTOMY, BILATERAL MYRINGOTOMY AND TUBES    . TUBAL LIGATION  1996        Family History  Problem Relation Age of Onset  . Leukemia Mother   . Diabetes Mother   . Breast cancer Mother   . Esophageal cancer Brother   . Diabetes Maternal Aunt   . Diabetes Maternal Uncle   . Diabetes Maternal Grandfather   .  Heart attack Father   . Colon cancer Neg Hx    Social History        Tobacco Use  . Smoking status: Never Smoker  . Smokeless tobacco: Never Used  Substance Use Topics  . Alcohol use: Yes    Alcohol/week: 1.0 standard drinks    Types: 1 Glasses of wine per week    Comment: 2 a month   . Drug use: No   Medications: I have reviewed the patient's current medications.       Allergies as of 12/17/2019      Reactions   Hydromorphone Hcl Nausea And Vomiting   Sulfa Antibiotics Nausea And Vomiting   Cephalexin Hives   Ciprocin-fluocin-procin [fluocinolone Acetonide] Hives   Bee Venom Swelling   REACTION: Severe swelling   Levofloxacin Hives   Celebrex [celecoxib] Rash   itching   Penicillins Rash   Did it involve swelling of the face/tongue/throat, SOB, or low BP? No  Did it involve sudden or severe rash/hives, skin peeling, or any reaction on the inside of your mouth or nose? Yes  Did you need to seek  medical attention at a hospital or doctor's office? Unknown  When did it last happen? childhood  If all above answers are "NO", may proceed with cephalosporin use.           Medication List       Accurate as of December 17, 2019 10:46 AM. If you have any questions, ask your nurse or doctor.        STOP taking these medications    tolterodine 2 MG tablet  Commonly known as: DETROL  Stopped by: Lucretia Roers, MD       TAKE these medications    acetaminophen 500 MG tablet  Commonly known as: TYLENOL  Take 1,000 mg by mouth 2 (two) times daily as needed for headache.   calcium carbonate 500 MG chewable tablet  Commonly known as: TUMS - dosed in mg elemental calcium  Chew 1 tablet by mouth daily as needed for indigestion or heartburn.   dicyclomine 10 MG capsule  Commonly known as: BENTYL  Take 10 mg by mouth 4 (four) times daily - before meals and at bedtime.   glipiZIDE 5 MG tablet  Commonly known as: GLUCOTROL  Take 5 mg by mouth 2 (two) times daily.     ibuprofen 200 MG tablet  Commonly known as: ADVIL  Take 600 mg by mouth 2 (two) times daily as needed for moderate pain.   losartan-hydrochlorothiazide 100-25 MG tablet  Commonly known as: HYZAAR  Take 1 tablet by mouth daily.   ondansetron 4 MG disintegrating tablet  Commonly known as: Zofran ODT  Take 1 tablet (4 mg total) by mouth every 8 (eight) hours as needed for nausea or vomiting.   oxyCODONE-acetaminophen 5-325 MG tablet  Commonly known as: PERCOCET/ROXICET  Take 1-2 tablets by mouth every 8 (eight) hours as needed for severe pain.   Potassium Chloride ER 20 MEQ Tbcr  Take 20 mEq by mouth 2 (two) times daily for 3 days.   Trulicity 1.5 MG/0.5ML Sopn  Generic drug: Dulaglutide  Inject 1.5 mg into the skin every Wednesday.       ROS:  A comprehensive review of systems was negative except for: Gastrointestinal: positive for abdominal pain, diarrhea, reflux symptoms and vomiting  Musculoskeletal: positive for back pain and joint pain  Neurological: positive for headaches  Blood pressure 105/67, pulse 87, temperature 98.3 F (36.8 C), temperature source Oral, resp. rate 14, height 5\' 4"  (1.626 m), weight 220 lb 9.6 oz (100.1 kg), SpO2 96 %.  Physical Exam  Vitals reviewed.  HENT:  Head: Normocephalic.  Nose: Nose normal.  Mouth/Throat:  Mouth: Mucous membranes are moist.  Eyes:  Extraocular Movements: Extraocular movements intact.  Pupils: Pupils are equal, round, and reactive to light.  Cardiovascular:  Rate and Rhythm: Normal rate and regular rhythm.  Pulmonary:  Effort: Pulmonary effort is normal.  Breath sounds: Normal breath sounds.  Abdominal:  General: There is no distension.  Palpations: Abdomen is soft.  Tenderness: There is abdominal tenderness in the right upper quadrant.  Musculoskeletal:  General: No swelling. Normal range of motion.  Cervical back: Normal range of motion. No rigidity.  Skin:  General: Skin is warm and dry.  Neurological:   General: No focal deficit present.  Mental Status: She is alert and oriented to person, place, and time.  Psychiatric:  Mood and Affect: Mood normal.  Behavior: Behavior normal.  Thought Content: Thought content normal.  Judgment: Judgment normal.   Results:  Personally reviewed CT and - distended gallbladder, layering  sludge, no thickening of wall  CT a/p 12/05/2019  CLINICAL DATA: Abdominal pain with nausea beginning about 1 week  ago.  EXAM:  CT ABDOMEN AND PELVIS WITH CONTRAST  TECHNIQUE:  Multidetector CT imaging of the abdomen and pelvis was performed  using the standard protocol following bolus administration of  intravenous contrast.  CONTRAST: 143mL OMNIPAQUE IOHEXOL 300 MG/ML SOLN  COMPARISON: Ultrasound 05/26/2015. Abdomen CT 04/04/2012  FINDINGS:  Lower chest: Scarring and bronchiectasis in both lower lobes,  worsened since the study of 2013. cannot rule out an element of  active inflammatory disease at the lung bases. No pleural effusion.  Hepatobiliary: Liver parenchyma is normal. There is a gradient  differential within the gallbladder that could indicate the presence  of sludge or stones. No evidence of gross gallbladder inflammation.  Consider abdominal ultrasound.  Pancreas: Normal  Spleen: Normal  Adrenals/Urinary Tract: Adrenal glands are normal. Kidneys are in  normal with exception of a 3 mm nonobstructing stone in the lower  pole of the left kidney. No hydronephrosis. Bladder is normal.  Stomach/Bowel: No acute or significant bowel finding. No evidence of  obstruction. No sign of inflammatory disease.  Vascular/Lymphatic: Aortic atherosclerosis. No aneurysm. IVC is  normal. No retroperitoneal adenopathy.  Reproductive: Previous hysterectomy. No pelvic mass.  Other: No free fluid or air.  Musculoskeletal: Ordinary mild lumbar degenerative changes.  IMPRESSION:  Density gradient within the gallbladder could indicate the presence  of sludge or stones.  Consider abdominal ultrasound. No CT evidence  of gallbladder inflammation or biliary ductal obstruction however.  Nonobstructing 3 mm stone in the lower pole of the left kidney.  Aortic atherosclerosis.  Electronically Signed  By: Nelson Chimes M.D.  On: 12/05/2019 19:43  CLINICAL DATA: Abdominal pain for 1 week with diarrhea  EXAM:  ULTRASOUND ABDOMEN LIMITED RIGHT UPPER QUADRANT  COMPARISON: 12/05/2019  FINDINGS:  Gallbladder:  Gallbladder is well distended with echogenic material within  consistent with gallbladder sludge. No gallstones are seen. No  pericholecystic fluid is noted.  Common bile duct:  Diameter: 5.2 mm at the upper limits of normal in size.  Liver:  Mildly heterogeneous and increased in echogenicity consistent with  fatty infiltration. No focal mass is noted. Portal vein is patent on  color Doppler imaging with normal direction of blood flow towards  the liver.  Other: None.  IMPRESSION:  Gallbladder sludge.  Fatty liver.  Electronically Signed  By: Inez Catalina M.D.  On: 12/11/2019 09:43   Assessment & Plan:  LASHE OLIVEIRA is a 54 y.o. female with gallbladder sludge, biliary colic symptoms. She has continued to have pain and nausea but no recent vomiting. She says she also has diarrhea. Warned her that not all of her symptoms are likely caused by the gallbladder and that the diarrhea could be more related to irritable bowel syndrome. Also warned that gallbladder surgery can make stools looser.   She has Bell's palsy with permanent facial droop, and reports having had her knee replacement after this and no issues with anesthesia.   -PLAN: I counseled the patient about the indication, risks and benefits of laparoscopic cholecystectomy. She understands there is a very small chance for bleeding, infection, injury to normal structures (including common bile duct), conversion to open surgery, persistent symptoms, evolution of postcholecystectomy diarrhea, need for  secondary interventions, anesthesia reaction, cardiopulmonary issues and other risks not specifically detailed here. I described the expected recovery, the plan for follow-up and the restrictions during the recovery phase. All questions were answered.  Zofran prescribed due to nausea.  All questions were answered to the satisfaction of the patient.  Lucretia Roers  12/17/2019, 10:46 AM

## 2019-12-30 NOTE — Patient Instructions (Signed)
[facility]  12/30/2019     @PREFPERIOPPHARMACY @   Your procedure is scheduled on  01/03/2020 .  Report to [facility] at  [phone]  A.M.  Call this number if you have problems the morning of surgery:  [phone]   Remember:  Do not eat or drink after midnight.                      Take these medicines the morning of surgery with A SIP OF WATER  Zofran(if needed), oxycodone(if needed). DO NOT take any medication for diabetes the morning of your surgery.    Do not wear jewelry, make-up or nail polish.  Do not wear lotions, powders, or perfumes. Please wear deodorant and brush your teeth.  Do not shave 48 hours prior to surgery.  Men may shave face and neck.  Do not bring valuables to the hospital.  [facility] is not responsible for any belongings or valuables.  Contacts, dentures or bridgework may not be worn into surgery.  Leave your suitcase in the car.  After surgery it may be brought to your room.  For patients admitted to the hospital, discharge time will be determined by your treatment team.  Patients discharged the day of surgery will not be allowed to drive home.   Name and phone number of your driver:   family Special instructions:  DO NOT smoke the morning of your surgery.  Please read over the following fact sheets that you were given. Anesthesia Post-op Instructions and Care and Recovery After Surgery       Laparoscopic Cholecystectomy, Care After This sheet gives you information about how to care for yourself after your procedure. Your [facility] care provider may also give you more specific instructions. If you have problems or questions, contact your [facility] care provider. What can I expect after the procedure? After the procedure, it is common to have:  Pain at your incision sites. You will be given medicines to control this pain.  Mild nausea or vomiting.  Bloating and possible shoulder pain from the air-like gas that was used during the  procedure. Follow these instructions at home: Incision care   Follow instructions from your [facility] care provider about how to take care of your incisions. Make sure you: ? Wash your hands with soap and water before you change your bandage (dressing). If soap and water are not available, use hand sanitizer. ? Change your dressing as told by your [facility] care provider. ? Leave stitches (sutures), skin glue, or adhesive strips in place. These skin closures may need to be in place for 2 weeks or longer. If adhesive strip edges start to loosen and curl up, you may trim the loose edges. Do not remove adhesive strips completely unless your [facility] care provider tells you to do that.  Do not take baths, swim, or use a hot tub until your [facility] care provider approves. Ask your [facility] care provider if you can take showers. You may only be allowed to take sponge baths for bathing.  Check your incision area every day for signs of infection. Check for: ? More redness, swelling, or pain. ? More fluid or blood. ? Warmth. ? Pus or a bad smell. Activity  Do not drive or use heavy machinery while taking prescription pain medicine.  Do not lift anything that is heavier than 10 lb (4.5 kg) until your [facility] care provider approves.  Do not play  contact sports until your [facility] care provider approves.  Do not drive for 24 hours if you were given a medicine to help you relax (sedative).  Rest as needed. Do not return to work or school until your [facility] care provider approves. General instructions  Take over-the-counter and prescription medicines only as told by your [facility] care provider.  To prevent or treat constipation while you are taking prescription pain medicine, your [facility] care provider may recommend that you: ? Drink enough fluid to keep your urine clear or pale yellow. ? Take over-the-counter or prescription medicines. ? Eat foods that are high in fiber, such as fresh fruits and vegetables, whole  grains, and beans. ? Limit foods that are high in fat and processed sugars, such as fried and sweet foods. Contact a [facility] care provider if:  You develop a rash.  You have more redness, swelling, or pain around your incisions.  You have more fluid or blood coming from your incisions.  Your incisions feel warm to the touch.  You have pus or a bad smell coming from your incisions.  You have a fever.  One or more of your incisions breaks open. Get help right away if:  You have trouble breathing.  You have chest pain.  You have increasing pain in your shoulders.  You faint or feel dizzy when you stand.  You have severe pain in your abdomen.  You have nausea or vomiting that lasts for more than one day.  You have leg pain. This information is not intended to replace advice given to you by your [facility] care provider. Make sure you discuss any questions you have with your [facility] care provider. Document Revised: 09/22/2017 Document Reviewed: 03/28/2016 Elsevier Patient Education  2020 [place] Anesthesia, Adult, Care After This sheet gives you information about how to care for yourself after your procedure. Your [facility] care provider may also give you more specific instructions. If you have problems or questions, contact your [facility] care provider. What can I expect after the procedure? After the procedure, the following side effects are common:  Pain or discomfort at the IV site.  Nausea.  Vomiting.  Sore throat.  Trouble concentrating.  Feeling cold or chills.  Weak or tired.  Sleepiness and fatigue.  Soreness and body aches. These side effects can affect parts of the body that were not involved in surgery. Follow these instructions at home:  For at least 24 hours after the procedure:  Have a responsible adult stay with you. It is important to have someone help care for you until you are awake and alert.  Rest as needed.  Do not: ? Participate  in activities in which you could fall or become injured. ? Drive. ? Use heavy machinery. ? Drink alcohol. ? Take sleeping pills or medicines that cause drowsiness. ? Make important decisions or sign legal documents. ? Take care of children on your own. Eating and drinking  Follow any instructions from your [facility] care provider about eating or drinking restrictions.  When you feel hungry, start by eating small amounts of foods that are soft and easy to digest (bland), such as toast. Gradually return to your regular diet.  Drink enough fluid to keep your urine pale yellow.  If you vomit, rehydrate by drinking water, juice, or clear broth. General instructions  If you have sleep apnea, surgery and certain medicines can increase your risk for breathing problems. Follow instructions from your [facility] care provider about wearing your sleep  device: ? Anytime you are sleeping, including during daytime naps. ? While taking prescription pain medicines, sleeping medicines, or medicines that make you drowsy.  Return to your normal activities as told by your [facility] care provider. Ask your [facility] care provider what activities are safe for you.  Take over-the-counter and prescription medicines only as told by your [facility] care provider.  If you smoke, do not smoke without supervision.  Keep all follow-up visits as told by your [facility] care provider. This is important. Contact a [facility] care provider if:  You have nausea or vomiting that does not get better with medicine.  You cannot eat or drink without vomiting.  You have pain that does not get better with medicine.  You are unable to pass urine.  You develop a skin rash.  You have a fever.  You have redness around your IV site that gets worse. Get help right away if:  You have difficulty breathing.  You have chest pain.  You have blood in your urine or stool, or you vomit blood. Summary  After the procedure, it is common to have a  sore throat or nausea. It is also common to feel tired.  Have a responsible adult stay with you for the first 24 hours after general anesthesia. It is important to have someone help care for you until you are awake and alert.  When you feel hungry, start by eating small amounts of foods that are soft and easy to digest (bland), such as toast. Gradually return to your regular diet.  Drink enough fluid to keep your urine pale yellow.  Return to your normal activities as told by your [facility] care provider. Ask your [facility] care provider what activities are safe for you. This information is not intended to replace advice given to you by your [facility] care provider. Make sure you discuss any questions you have with your [facility] care provider. Document Revised: 10/13/2017 Document Reviewed: 05/26/2017 Elsevier Patient Education  2020 [employer]. How to Use Chlorhexidine for Bathing Chlorhexidine gluconate (CHG) is a germ-killing (antiseptic) solution that is used to clean the skin. It can get rid of the bacteria that normally live on the skin and can keep them away for about 24 hours. To clean your skin with CHG, you may be given:  A CHG solution to use in the shower or as part of a sponge bath.  A prepackaged cloth that contains CHG. Cleaning your skin with CHG may help lower the risk for infection:  While you are staying in the intensive care unit of the hospital.  If you have a vascular access, such as a central line, to provide short-term or long-term access to your veins.  If you have a catheter to drain urine from your bladder.  If you are on a ventilator. A ventilator is a machine that helps you breathe by moving air in and out of your lungs.  After surgery. What are the risks? Risks of using CHG include:  A skin reaction.  Hearing loss, if CHG gets in your ears.  Eye injury, if CHG gets in your eyes and is not rinsed out.  The CHG product catching fire. Make sure that you avoid  smoking and flames after applying CHG to your skin. Do not use CHG:  If you have a chlorhexidine allergy or have previously reacted to chlorhexidine.  On babies younger than 54 months of age. How to use CHG solution  Use CHG only as told by your [facility]  care provider, and follow the instructions on the label.  Use the full amount of CHG as directed. Usually, this is one bottle. During a shower Follow these steps when using CHG solution during a shower (unless your [facility] care provider gives you different instructions): 1. Start the shower. 2. Use your normal soap and shampoo to wash your face and hair. 3. Turn off the shower or move out of the shower stream. 4. Pour the CHG onto a clean washcloth. Do not use any type of brush or rough-edged sponge. 5. Starting at your neck, lather your body down to your toes. Make sure you follow these instructions: ? If you will be having surgery, pay special attention to the part of your body where you will be having surgery. Scrub this area for at least 1 minute. ? Do not use CHG on your head or face. If the solution gets into your ears or eyes, rinse them well with water. ? Avoid your genital area. ? Avoid any areas of skin that have broken skin, cuts, or scrapes. ? Scrub your back and under your arms. Make sure to wash skin folds. 6. Let the lather sit on your skin for 1-2 minutes or as long as told by your [facility] care provider. 7. Thoroughly rinse your entire body in the shower. Make sure that all body creases and crevices are rinsed well. 8. Dry off with a clean towel. Do not put any substances on your body afterward--such as powder, lotion, or perfume--unless you are told to do so by your [facility] care provider. Only use lotions that are recommended by the manufacturer. 9. Put on clean clothes or pajamas. 10. If it is the night before your surgery, sleep in clean sheets.  During a sponge bath Follow these steps when using CHG solution during a  sponge bath (unless your [facility] care provider gives you different instructions): 1. Use your normal soap and shampoo to wash your face and hair. 2. Pour the CHG onto a clean washcloth. 3. Starting at your neck, lather your body down to your toes. Make sure you follow these instructions: ? If you will be having surgery, pay special attention to the part of your body where you will be having surgery. Scrub this area for at least 1 minute. ? Do not use CHG on your head or face. If the solution gets into your ears or eyes, rinse them well with water. ? Avoid your genital area. ? Avoid any areas of skin that have broken skin, cuts, or scrapes. ? Scrub your back and under your arms. Make sure to wash skin folds. 4. Let the lather sit on your skin for 1-2 minutes or as long as told by your [facility] care provider. 5. Using a different clean, wet washcloth, thoroughly rinse your entire body. Make sure that all body creases and crevices are rinsed well. 6. Dry off with a clean towel. Do not put any substances on your body afterward--such as powder, lotion, or perfume--unless you are told to do so by your [facility] care provider. Only use lotions that are recommended by the manufacturer. 7. Put on clean clothes or pajamas. 8. If it is the night before your surgery, sleep in clean sheets. How to use CHG prepackaged cloths  Only use CHG cloths as told by your [facility] care provider, and follow the instructions on the label.  Use the CHG cloth on clean, dry skin.  Do not use the CHG cloth on your head or face  unless your [facility] care provider tells you to.  When washing with the CHG cloth: ? Avoid your genital area. ? Avoid any areas of skin that have broken skin, cuts, or scrapes. Before surgery Follow these steps when using a CHG cloth to clean before surgery (unless your [facility] care provider gives you different instructions): 1. Using the CHG cloth, vigorously scrub the part of your body where you will be  having surgery. Scrub using a back-and-forth motion for 3 minutes. The area on your body should be completely wet with CHG when you are done scrubbing. 2. Do not rinse. Discard the cloth and let the area air-dry. Do not put any substances on the area afterward, such as powder, lotion, or perfume. 3. Put on clean clothes or pajamas. 4. If it is the night before your surgery, sleep in clean sheets.  For general bathing Follow these steps when using CHG cloths for general bathing (unless your [facility] care provider gives you different instructions). 1. Use a separate CHG cloth for each area of your body. Make sure you wash between any folds of skin and between your fingers and toes. Wash your body in the following order, switching to a new cloth after each step: ? The front of your neck, shoulders, and chest. ? Both of your arms, under your arms, and your hands. ? Your stomach and groin area, avoiding the genitals. ? Your right leg and foot. ? Your left leg and foot. ? The back of your neck, your back, and your buttocks. 2. Do not rinse. Discard the cloth and let the area air-dry. Do not put any substances on your body afterward--such as powder, lotion, or perfume--unless you are told to do so by your [facility] care provider. Only use lotions that are recommended by the manufacturer. 3. Put on clean clothes or pajamas. Contact a [facility] care provider if:  Your skin gets irritated after scrubbing.  You have questions about using your solution or cloth. Get help right away if:  Your eyes become very red or swollen.  Your eyes itch badly.  Your skin itches badly and is red or swollen.  Your hearing changes.  You have trouble seeing.  You have swelling or tingling in your mouth or throat.  You have trouble breathing.  You swallow any chlorhexidine. Summary  Chlorhexidine gluconate (CHG) is a germ-killing (antiseptic) solution that is used to clean the skin. Cleaning your skin with CHG may  help to lower your risk for infection.  You may be given CHG to use for bathing. It may be in a bottle or in a prepackaged cloth to use on your skin. Carefully follow your [facility] care provider's instructions and the instructions on the product label.  Do not use CHG if you have a chlorhexidine allergy.  Contact your [facility] care provider if your skin gets irritated after scrubbing. This information is not intended to replace advice given to you by your [facility] care provider. Make sure you discuss any questions you have with your [facility] care provider. Document Revised: 12/27/2018 Document Reviewed: 09/07/2017 Elsevier Patient Education  2020 [employer].

## 2020-01-01 ENCOUNTER — Other Ambulatory Visit (HOSPITAL_COMMUNITY)
Admission: RE | Admit: 2020-01-01 | Discharge: 2020-01-01 | Disposition: A | Discharge status: Home / Self Care | Payer: BC Managed Care – PPO | Source: Ambulatory Visit | Attending: General Surgery | Admitting: General Surgery

## 2020-01-01 ENCOUNTER — Emergency Department (HOSPITAL_COMMUNITY)
Admission: EM | Admit: 2020-01-01 | Discharge: 2020-01-02 | Disposition: A | Discharge status: Home / Self Care | Payer: BC Managed Care – PPO | Attending: Emergency Medicine | Admitting: Emergency Medicine

## 2020-01-01 ENCOUNTER — Telehealth: Payer: Self-pay

## 2020-01-01 ENCOUNTER — Other Ambulatory Visit: Payer: Self-pay

## 2020-01-01 ENCOUNTER — Encounter (HOSPITAL_COMMUNITY)
Admission: RE | Admit: 2020-01-01 | Discharge: 2020-01-01 | Disposition: A | Discharge status: Home / Self Care | Payer: BC Managed Care – PPO | Source: Ambulatory Visit | Attending: General Surgery | Admitting: General Surgery

## 2020-01-01 ENCOUNTER — Emergency Department (HOSPITAL_COMMUNITY): Payer: BC Managed Care – PPO

## 2020-01-01 ENCOUNTER — Encounter (HOSPITAL_COMMUNITY): Payer: Self-pay

## 2020-01-01 DIAGNOSIS — Z79899 Other long term (current) drug therapy: Secondary | ICD-10-CM | POA: Diagnosis not present

## 2020-01-01 DIAGNOSIS — E1122 Type 2 diabetes mellitus with diabetic chronic kidney disease: Secondary | ICD-10-CM | POA: Insufficient documentation

## 2020-01-01 DIAGNOSIS — N200 Calculus of kidney: Secondary | ICD-10-CM | POA: Diagnosis not present

## 2020-01-01 DIAGNOSIS — I129 Hypertensive chronic kidney disease with stage 1 through stage 4 chronic kidney disease, or unspecified chronic kidney disease: Secondary | ICD-10-CM | POA: Insufficient documentation

## 2020-01-01 DIAGNOSIS — Z20822 Contact with and (suspected) exposure to covid-19: Secondary | ICD-10-CM | POA: Diagnosis not present

## 2020-01-01 DIAGNOSIS — K5900 Constipation, unspecified: Secondary | ICD-10-CM | POA: Insufficient documentation

## 2020-01-01 DIAGNOSIS — N189 Chronic kidney disease, unspecified: Secondary | ICD-10-CM | POA: Diagnosis not present

## 2020-01-01 DIAGNOSIS — Z7984 Long term (current) use of oral hypoglycemic drugs: Secondary | ICD-10-CM | POA: Diagnosis not present

## 2020-01-01 HISTORY — DX: Sleep apnea, unspecified: G47.30

## 2020-01-01 HISTORY — DX: Family history of other specified conditions: Z84.89

## 2020-01-01 LAB — HEPATIC FUNCTION PANEL
ALT: 17 U/L (ref 0–44)
AST: 17 U/L (ref 15–41)
Albumin: 3.9 g/dL (ref 3.5–5.0)
Alkaline Phosphatase: 69 U/L (ref 38–126)
Bilirubin, Direct: 0.1 mg/dL (ref 0.0–0.2)
Indirect Bilirubin: 0.4 mg/dL (ref 0.3–0.9)
Total Bilirubin: 0.5 mg/dL (ref 0.3–1.2)
Total Protein: 7.5 g/dL (ref 6.5–8.1)

## 2020-01-01 LAB — CBC WITH DIFFERENTIAL/PLATELET
Abs Immature Granulocytes: 0.04 10*3/uL (ref 0.00–0.07)
Basophils Absolute: 0.1 10*3/uL (ref 0.0–0.1)
Basophils Relative: 0 %
Eosinophils Absolute: 0.2 10*3/uL (ref 0.0–0.5)
Eosinophils Relative: 1 %
HCT: 40.9 % (ref 36.0–46.0)
Hemoglobin: 13.5 g/dL (ref 12.0–15.0)
Immature Granulocytes: 0 %
Lymphocytes Relative: 24 %
Lymphs Abs: 3.1 10*3/uL (ref 0.7–4.0)
MCH: 29.3 pg (ref 26.0–34.0)
MCHC: 33 g/dL (ref 30.0–36.0)
MCV: 88.9 fL (ref 80.0–100.0)
Monocytes Absolute: 1.1 10*3/uL — ABNORMAL HIGH (ref 0.1–1.0)
Monocytes Relative: 8 %
Neutro Abs: 8.4 10*3/uL — ABNORMAL HIGH (ref 1.7–7.7)
Neutrophils Relative %: 67 %
Platelets: 373 10*3/uL (ref 150–400)
RBC: 4.6 MIL/uL (ref 3.87–5.11)
RDW: 14 % (ref 11.5–15.5)
WBC: 12.8 10*3/uL — ABNORMAL HIGH (ref 4.0–10.5)
nRBC: 0 % (ref 0.0–0.2)

## 2020-01-01 LAB — URINALYSIS, ROUTINE W REFLEX MICROSCOPIC
Bilirubin Urine: NEGATIVE
Glucose, UA: NEGATIVE mg/dL
Hgb urine dipstick: NEGATIVE
Ketones, ur: NEGATIVE mg/dL
Leukocytes,Ua: NEGATIVE
Nitrite: NEGATIVE
Protein, ur: NEGATIVE mg/dL
Specific Gravity, Urine: >1.046 — ABNORMAL HIGH (ref 1.005–1.030)
pH: 7 (ref 5.0–8.0)

## 2020-01-01 LAB — BASIC METABOLIC PANEL
Anion gap: 13 (ref 5–15)
BUN: 12 mg/dL (ref 6–20)
CO2: 24 mmol/L (ref 22–32)
Calcium: 9.1 mg/dL (ref 8.9–10.3)
Chloride: 100 mmol/L (ref 98–111)
Creatinine, Ser: 0.74 mg/dL (ref 0.44–1.00)
GFR calc Af Amer: >60 mL/min (ref >60)
GFR calc non Af Amer: >60 mL/min (ref >60)
Glucose, Bld: 189 mg/dL — ABNORMAL HIGH (ref 70–99)
Potassium: 3.5 mmol/L (ref 3.5–5.1)
Sodium: 137 mmol/L (ref 135–145)

## 2020-01-01 LAB — HEMOGLOBIN A1C
Hgb A1c MFr Bld: 7.6 % — ABNORMAL HIGH (ref 4.8–5.6)
Mean Plasma Glucose: 171.42 mg/dL

## 2020-01-01 LAB — LIPASE, BLOOD: Lipase: 32 U/L (ref 11–51)

## 2020-01-01 LAB — SARS CORONAVIRUS 2 (TAT 6-24 HRS): SARS Coronavirus 2: NEGATIVE

## 2020-01-01 MED ORDER — SODIUM CHLORIDE 0.9 % IV BOLUS
1000.0000 mL | Freq: Once | INTRAVENOUS | Status: AC
  Administered 2020-01-01: 1000 mL via INTRAVENOUS

## 2020-01-01 MED ORDER — IOHEXOL 350 MG/ML SOLN: 100.0000 mL | Freq: Once | INTRAVENOUS | Status: DC | PRN

## 2020-01-01 MED ORDER — IOHEXOL 300 MG/ML  SOLN
100.0000 mL | Freq: Once | INTRAMUSCULAR | Status: AC | PRN
  Administered 2020-01-01: 100 mL via INTRAVENOUS

## 2020-01-01 NOTE — ED Triage Notes (Signed)
Pt presents to ED with complaints of constipation x 5 days. Pt states she was last able to pass a small amount of stool on Friday. Pt has had enema today and magnesium citrate today. Pt scheduled for gallbladder surgery Friday.

## 2020-01-01 NOTE — Discharge Instructions (Signed)

## 2020-01-01 NOTE — Telephone Encounter (Signed)
Patient stated she is very constipated-has tried Miralax and suppositories and able to pass small hard stool balls. We discussed using an enema and she stated she has used those in the past with success. She is scheduled for Cholecystectomy 01/03/20 with Dr.Bridges and wanted to be sure with using an enema would not interfere with her surgery. Patient instructed to continue to take the miralax until bowel movements are regular.

## 2020-01-01 NOTE — ED Notes (Signed)
ED Provider at bedside. 

## 2020-01-01 NOTE — ED Provider Notes (Signed)
[facility] EMERGENCY DEPARTMENT Provider Note   CSN: [id number] Arrival date & time: 01/01/20  1910     History Chief Complaint  Patient presents with  . Constipation    Victoria Williams is a 54 y.o. female.  HPI   Pt is a 54 y/o female with a h/o arthritis, Bell's palsy, CKD, DM, esophagitis, HSP, nephrolithiasis, HTN, IBS, obesity, OSA, who presents to the ED for evaluation of constipation that started 1 week ago. States she has only had a small amount of output. States she feels like she has hard stool in the rectum that she cannot pass.  She also reports some nausea and lower abd pain that she rates at 6/10. Pain has been present for a few days. She denies fevers or vomiting. Denies urinary sxs.  She has been passing flatus. She has tried several medications at home including suppositories and an enema. She has also tried mag citrate without relief.  Past Medical History:  Diagnosis Date  . Arthritis   . Bell's palsy    approx 2015  . Chronic kidney disease   . Diabetes mellitus    only when on steriods  . Esophagitis 2012   EGD  . Family history of adverse reaction to anesthesia    PONV and hallucinations-MOM  . Henoch-Schonlein purpura (HCC)   . History of kidney stones   . History of UTI   . Hypertension   . IBS (irritable bowel syndrome)   . Obesity   . Sleep apnea     Patient Active Problem List   Diagnosis Date Noted  . Gallbladder sludge 12/17/2019  . Facial paralysis/Bells palsy 01/27/2014  . Facial droop 01/26/2014  . Facial weakness 01/26/2014  . Bloating 09/30/2013  . Loss of weight 09/30/2013  . Abdominal pain, other specified site 09/30/2013  . Abdominal pain, epigastric 02/10/2011  . Henoch-Schonlein purpura (HCC) 02/10/2011  . Diabetes mellitus without mention of complication 02/10/2011    Past Surgical History:  Procedure Laterality Date  . ABDOMINAL HYSTERECTOMY  2003  . ACHILLES TENDON SURGERY Left   . CESAREAN SECTION    . KNEE SURGERY  Left    arthroscopic  . NECK SURGERY  2000   cervical fusion 6/7  . REPLACEMENT TOTAL KNEE Left 2019   [facility]  . TONSILECTOMY, ADENOIDECTOMY, BILATERAL MYRINGOTOMY AND TUBES    . TONSILLECTOMY    . TUBAL LIGATION  1996     OB History   No obstetric history on file.     Family History  Problem Relation Age of Onset  . Leukemia Mother   . Diabetes Mother   . Breast cancer Mother   . Esophageal cancer Brother   . Diabetes Maternal Aunt   . Diabetes Maternal Uncle   . Diabetes Maternal Grandfather   . Heart attack Father   . Colon cancer Neg Hx     Social History   Tobacco Use  . Smoking status: Never Smoker  . Smokeless tobacco: Never Used  Substance Use Topics  . Alcohol use: Yes    Alcohol/week: 1.0 standard drinks    Types: 1 Glasses of wine per week    Comment: 2 a month   . Drug use: No    Home Medications Prior to Admission medications   Medication Sig Start Date End Date Taking? Authorizing Provider  acetaminophen (TYLENOL) 500 MG tablet Take 1,000 mg by mouth 2 (two) times daily as needed for headache.   Yes [provider]  calcium carbonate (  TUMS - DOSED IN MG ELEMENTAL CALCIUM) 500 MG chewable tablet Chew 1 tablet by mouth daily as needed for indigestion or heartburn.    Yes [provider]  dicyclomine (BENTYL) 10 MG capsule Take 10 mg by mouth 4 (four) times daily -  before meals and at bedtime.  03/14/18  Yes [provider]  glipiZIDE (GLUCOTROL) 5 MG tablet Take 5 mg by mouth 2 (two) times daily. 11/01/19  Yes [provider]  ibuprofen (ADVIL,MOTRIN) 200 MG tablet Take 600 mg by mouth 2 (two) times daily as needed for moderate pain.   Yes [provider]  losartan-hydrochlorothiazide (HYZAAR) 100-25 MG tablet Take 1 tablet by mouth daily.    Yes [provider]  ondansetron (ZOFRAN ODT) 4 MG disintegrating tablet Take 1 tablet (4 mg total) by mouth every 8 (eight) hours as needed for nausea or  vomiting. 12/17/19  Yes Lucretia Roers, MD  oxyCODONE-acetaminophen (PERCOCET/ROXICET) 5-325 MG tablet Take 1-2 tablets by mouth every 8 (eight) hours as needed for severe pain. 12/05/19  Yes Graciella Freer A, PA-C  potassium chloride 20 MEQ TBCR Take 20 mEq by mouth 2 (two) times daily for 3 days. Patient not taking: Reported on 01/01/2020 12/05/19 12/08/19  Graciella Freer A, PA-C  TRULICITY 1.5 MG/0.5ML SOPN Inject 1.5 mg into the skin every Wednesday.  11/27/19   [provider]    Allergies    Hydromorphone hcl, Sulfa antibiotics, Cephalexin, Ciprocin-fluocin-procin [fluocinolone acetonide], Bee venom, Levofloxacin, Celebrex [celecoxib], and Penicillins  Review of Systems   Review of Systems  Constitutional: Negative for chills and fever.  HENT: Negative for ear pain and sore throat.   Eyes: Negative for visual disturbance.  Respiratory: Negative for cough and shortness of breath.   Cardiovascular: Negative for chest pain.  Gastrointestinal: Positive for abdominal pain, constipation and nausea. Negative for diarrhea and vomiting.  Genitourinary: Negative for dysuria and hematuria.  Musculoskeletal: Negative for back pain.  Skin: Negative for rash.  Neurological: Negative for headaches.  All other systems reviewed and are negative.   Physical Exam Updated Vital Signs BP 128/73 (BP Location: Right Arm)   Pulse 94   Temp 98.4 F (36.9 C) (Oral)   Resp 16   Ht 5\' 4"  (1.626 m)   Wt 99.8 kg   SpO2 98%   BMI 37.76 kg/m   Physical Exam Vitals and nursing note reviewed.  Constitutional:      General: She is not in acute distress.    Appearance: She is well-developed.  HENT:     Head: Normocephalic and atraumatic.  Eyes:     Conjunctiva/sclera: Conjunctivae normal.  Cardiovascular:     Rate and Rhythm: Normal rate and regular rhythm.     Heart sounds: Normal heart sounds. No murmur.  Pulmonary:     Effort: Pulmonary effort is normal. No respiratory distress.      Breath sounds: Normal breath sounds. No wheezing, rhonchi or rales.  Abdominal:     General: Bowel sounds are normal.     Palpations: Abdomen is soft.     Tenderness: There is abdominal tenderness (lower abd and RUQ). There is no guarding or rebound.  Musculoskeletal:     Cervical back: Neck supple.  Skin:    General: Skin is warm and dry.  Neurological:     Mental Status: She is alert.     ED Results / Procedures / Treatments   Labs (all labs ordered are listed, but only abnormal results are displayed) Labs  Reviewed  CBC WITH DIFFERENTIAL/PLATELET - Abnormal; Notable for the following components:      Result Value   WBC 12.8 (*)    Neutro Abs 8.4 (*)    Monocytes Absolute 1.1 (*)    All other components within normal limits  URINALYSIS, ROUTINE W REFLEX MICROSCOPIC - Abnormal; Notable for the following components:   Specific Gravity, Urine >1.046 (*)    All other components within normal limits  HEPATIC FUNCTION PANEL  LIPASE, BLOOD    EKG None  Radiology CT ABDOMEN PELVIS W CONTRAST  Result Date: 01/01/2020 CLINICAL DATA:  54 year old female with 5 days of constipation. Enema today. Scheduled for gallbladder surgery soon. EXAM: CT ABDOMEN AND PELVIS WITH CONTRAST TECHNIQUE: Multidetector CT imaging of the abdomen and pelvis was performed using the standard protocol following bolus administration of intravenous contrast. CONTRAST:  189mL OMNIPAQUE IOHEXOL 300 MG/ML  SOLN COMPARISON:  Abdomen ultrasound 12/11/2019. CT Abdomen and Pelvis 12/04/2010 and earlier. FINDINGS: Lower chest: Abnormal lung bases not significantly changed since 2013 compatible with chronic lung disease with scarring. Cardiac size at the upper limits of normal. No pericardial or pleural effusion. Hepatobiliary: Gallbladder sludge better demonstrated on recent ultrasound. Negative liver and gallbladder CT appearance. No bile duct enlargement. Pancreas: Negative. Spleen: Negative. Adrenals/Urinary Tract:  Normal adrenal glands. Punctate left lower pole nephrolithiasis is stable. The right renal collecting system and proximal ureter are duplicated (normal variant). Symmetric renal enhancement and contrast excretion. Unremarkable urinary bladder. Stable punctate pelvis phlebolith. Stomach/Bowel: There is mild retained stool in the rectum, and redundancy of the large bowel (especially the sigmoid colon), but only fluid within the large bowel upstream of the sigmoid. There is some low-density stool mixed with fluid in the redundant sigmoid. No large bowel wall thickening. The tip of the cecum is in the midline with normal appendix visible on coronal image 22. Terminal ileum is fluid-filled but otherwise negative. No dilated small bowel. Decompressed stomach and duodenum. No mesenteric stranding, free air or free fluid identified. Vascular/Lymphatic: Major arterial structures are patent. Mild Aortoiliac calcified atherosclerosis. Portal venous system is patent. Reproductive: Surgically absent gallbladder with normal ovaries. Other: No pelvic free fluid. Musculoskeletal: No acute osseous abnormality identified. IMPRESSION: 1. Redundant large bowel, but only mild retained stool limited to the sigmoid and rectum. Elsewhere there is only fluid throughout the large bowel compatible with recent enema. 2. No acute or inflammatory process identified. Left nephrolithiasis. 3. Chronic lower lobe lung disease with scarring. Electronically Signed   By: Genevie Ann M.D.   On: 01/01/2020 21:40    Procedures Procedures (including critical care time)  Medications Ordered in ED Medications  sodium chloride 0.9 % bolus 1,000 mL (0 mLs Intravenous Stopped 01/01/20 2249)  iohexol (OMNIPAQUE) 300 MG/ML solution 100 mL (100 mLs Intravenous Contrast Given 01/01/20 2120)    ED Course  I have reviewed the triage vital signs and the nursing notes.  Pertinent labs & imaging results that were available during my care of the patient were  reviewed by me and considered in my medical decision making (see chart for details).    MDM Rules/Calculators/A&P                      54 y/o female presenting with lower abd pain and constipation. Has tried multiple OTC meds at home without relief.   Labs reviewed/interpreted CBC w mild leukocytosis, no anemia BMP reviewed from earlier, nonacute Hepatic function panel wnl Lipase wnl UA neg for UTI  CT abd/pelvis  1. Redundant large bowel, but only mild retained stool limited to the sigmoid and rectum. Elsewhere there is only fluid throughout the large bowel compatible with recent enema. 2. No acute or inflammatory process identified. Left nephrolithiasis. 3. Chronic lower lobe lung disease with scarring.  Pt was given soap suds enema and IVF in the ED. On reassessment she states she has had multiple BMs and is feeling much better. Discussed results of CT scan and plan for d/c. Advised on f/u and return precautions. She voices understanding of the plan and reasons to return. All questions answered, pt stable for d.c  Final Clinical Impression(s) / ED Diagnoses Final diagnoses:  Constipation, unspecified constipation type    Rx / DC Orders ED Discharge Orders    None       Karrie Meres, PA-C 01/01/20 2329    Milagros Loll, MD 01/02/20 1529

## 2020-01-01 NOTE — Pre-Procedure Instructions (Signed)
HgbA1C routed to PCP. 

## 2020-01-01 NOTE — ED Notes (Signed)
Pt transported to CT Scan

## 2020-01-01 NOTE — Telephone Encounter (Signed)
Spoke with DR.Bridges-instruct patient to drink magnesium citrate-if no relief go to emergency room -may need to postpone surgery until resolved.

## 2020-01-01 NOTE — Telephone Encounter (Signed)
Patient called office to say the enema has not helped- she is unable to pass the large plug of stool after using the enema. I will call Dr.Bridges for further advise and return call to patient.

## 2020-01-03 ENCOUNTER — Ambulatory Visit (HOSPITAL_COMMUNITY)
Admission: RE | Admit: 2020-01-03 | Discharge: 2020-01-03 | Disposition: A | Discharge status: Home / Self Care | Payer: BC Managed Care – PPO | Attending: General Surgery | Admitting: General Surgery

## 2020-01-03 ENCOUNTER — Encounter (HOSPITAL_COMMUNITY): Payer: Self-pay | Admitting: General Surgery

## 2020-01-03 ENCOUNTER — Encounter (HOSPITAL_COMMUNITY)
Admission: RE | Disposition: A | Discharge status: Home / Self Care | Payer: Self-pay | Source: Home / Self Care | Attending: General Surgery

## 2020-01-03 ENCOUNTER — Ambulatory Visit (HOSPITAL_COMMUNITY): Payer: BC Managed Care – PPO | Admitting: Anesthesiology

## 2020-01-03 DIAGNOSIS — G4730 Sleep apnea, unspecified: Secondary | ICD-10-CM | POA: Insufficient documentation

## 2020-01-03 DIAGNOSIS — M1990 Unspecified osteoarthritis, unspecified site: Secondary | ICD-10-CM | POA: Diagnosis not present

## 2020-01-03 DIAGNOSIS — K811 Chronic cholecystitis: Secondary | ICD-10-CM | POA: Insufficient documentation

## 2020-01-03 DIAGNOSIS — E669 Obesity, unspecified: Secondary | ICD-10-CM | POA: Insufficient documentation

## 2020-01-03 DIAGNOSIS — Z91030 Bee allergy status: Secondary | ICD-10-CM | POA: Insufficient documentation

## 2020-01-03 DIAGNOSIS — Z7984 Long term (current) use of oral hypoglycemic drugs: Secondary | ICD-10-CM | POA: Diagnosis not present

## 2020-01-03 DIAGNOSIS — Z87442 Personal history of urinary calculi: Secondary | ICD-10-CM | POA: Insufficient documentation

## 2020-01-03 DIAGNOSIS — Z8249 Family history of ischemic heart disease and other diseases of the circulatory system: Secondary | ICD-10-CM | POA: Insufficient documentation

## 2020-01-03 DIAGNOSIS — Z882 Allergy status to sulfonamides status: Secondary | ICD-10-CM | POA: Insufficient documentation

## 2020-01-03 DIAGNOSIS — Z886 Allergy status to analgesic agent status: Secondary | ICD-10-CM | POA: Insufficient documentation

## 2020-01-03 DIAGNOSIS — K828 Other specified diseases of gallbladder: Secondary | ICD-10-CM

## 2020-01-03 DIAGNOSIS — Z79899 Other long term (current) drug therapy: Secondary | ICD-10-CM | POA: Insufficient documentation

## 2020-01-03 DIAGNOSIS — I129 Hypertensive chronic kidney disease with stage 1 through stage 4 chronic kidney disease, or unspecified chronic kidney disease: Secondary | ICD-10-CM | POA: Diagnosis not present

## 2020-01-03 DIAGNOSIS — G510 Bell's palsy: Secondary | ICD-10-CM | POA: Diagnosis not present

## 2020-01-03 DIAGNOSIS — Z881 Allergy status to other antibiotic agents status: Secondary | ICD-10-CM | POA: Diagnosis not present

## 2020-01-03 DIAGNOSIS — N189 Chronic kidney disease, unspecified: Secondary | ICD-10-CM | POA: Insufficient documentation

## 2020-01-03 DIAGNOSIS — Z880 Allergy status to penicillin: Secondary | ICD-10-CM | POA: Diagnosis not present

## 2020-01-03 DIAGNOSIS — E1122 Type 2 diabetes mellitus with diabetic chronic kidney disease: Secondary | ICD-10-CM | POA: Insufficient documentation

## 2020-01-03 DIAGNOSIS — Z833 Family history of diabetes mellitus: Secondary | ICD-10-CM | POA: Diagnosis not present

## 2020-01-03 DIAGNOSIS — Z885 Allergy status to narcotic agent status: Secondary | ICD-10-CM | POA: Diagnosis not present

## 2020-01-03 DIAGNOSIS — I10 Essential (primary) hypertension: Secondary | ICD-10-CM | POA: Diagnosis not present

## 2020-01-03 DIAGNOSIS — K589 Irritable bowel syndrome without diarrhea: Secondary | ICD-10-CM | POA: Diagnosis not present

## 2020-01-03 DIAGNOSIS — E119 Type 2 diabetes mellitus without complications: Secondary | ICD-10-CM | POA: Diagnosis not present

## 2020-01-03 HISTORY — PX: CHOLECYSTECTOMY: SHX55

## 2020-01-03 LAB — GLUCOSE, CAPILLARY
Glucose-Capillary: 174 mg/dL — ABNORMAL HIGH (ref 70–99)
Glucose-Capillary: 167 mg/dL — ABNORMAL HIGH (ref 70–99)

## 2020-01-03 SURGERY — LAPAROSCOPIC CHOLECYSTECTOMY
Anesthesia: General | Site: Abdomen

## 2020-01-03 MED ORDER — PROPOFOL 10 MG/ML IV BOLUS
INTRAVENOUS | Status: DC | PRN
  Administered 2020-01-03: 200 mg via INTRAVENOUS

## 2020-01-03 MED ORDER — SODIUM CHLORIDE 0.9 % IV SOLN
1.0000 g | INTRAVENOUS | Status: AC
  Administered 2020-01-03: 1 g via INTRAVENOUS

## 2020-01-03 MED ORDER — ROCURONIUM 10MG/ML (10ML) SYRINGE FOR MEDFUSION PUMP - OPTIME
INTRAVENOUS | Status: DC | PRN
  Administered 2020-01-03: 30 mg via INTRAVENOUS

## 2020-01-03 MED ORDER — LIDOCAINE 2% (20 MG/ML) 5 ML SYRINGE
INTRAMUSCULAR | Status: AC
  Filled 2020-01-03: qty 5

## 2020-01-03 MED ORDER — CHLORHEXIDINE GLUCONATE CLOTH 2 % EX PADS: 6.0000 | MEDICATED_PAD | Freq: Once | CUTANEOUS | Status: DC

## 2020-01-03 MED ORDER — FENTANYL CITRATE (PF) 250 MCG/5ML IJ SOLN
INTRAMUSCULAR | Status: AC
  Filled 2020-01-03: qty 5

## 2020-01-03 MED ORDER — ROCURONIUM BROMIDE 10 MG/ML (PF) SYRINGE
PREFILLED_SYRINGE | INTRAVENOUS | Status: AC
  Filled 2020-01-03: qty 10

## 2020-01-03 MED ORDER — MIDAZOLAM HCL 5 MG/5ML IJ SOLN
INTRAMUSCULAR | Status: DC | PRN
  Administered 2020-01-03 (×2): 1 mg via INTRAVENOUS

## 2020-01-03 MED ORDER — MIDAZOLAM HCL 2 MG/2ML IJ SOLN
INTRAMUSCULAR | Status: AC
  Filled 2020-01-03: qty 2

## 2020-01-03 MED ORDER — SODIUM CHLORIDE 0.9 % IR SOLN
Status: DC | PRN
  Administered 2020-01-03: 1000 mL

## 2020-01-03 MED ORDER — ONDANSETRON HCL 4 MG/2ML IJ SOLN
INTRAMUSCULAR | Status: DC | PRN
  Administered 2020-01-03: 4 mg via INTRAVENOUS

## 2020-01-03 MED ORDER — SUGAMMADEX SODIUM 200 MG/2ML IV SOLN
INTRAVENOUS | Status: DC | PRN
  Administered 2020-01-03: 200 mg via INTRAVENOUS

## 2020-01-03 MED ORDER — BUPIVACAINE LIPOSOME 1.3 % IJ SUSP
INTRAMUSCULAR | Status: AC
  Filled 2020-01-03: qty 20

## 2020-01-03 MED ORDER — LACTATED RINGERS IV SOLN
INTRAVENOUS | Status: DC
  Administered 2020-01-03: 1000 mL via INTRAVENOUS

## 2020-01-03 MED ORDER — SODIUM CHLORIDE 0.9 % IV SOLN
INTRAVENOUS | Status: AC
  Filled 2020-01-03: qty 1

## 2020-01-03 MED ORDER — PROPOFOL 10 MG/ML IV BOLUS
INTRAVENOUS | Status: AC
  Filled 2020-01-03: qty 40

## 2020-01-03 MED ORDER — FENTANYL CITRATE (PF) 100 MCG/2ML IJ SOLN
25.0000 ug | INTRAMUSCULAR | Status: DC | PRN
  Filled 2020-01-03: qty 2

## 2020-01-03 MED ORDER — POLYETHYLENE GLYCOL 3350 17 G PO PACK: 17.0000 g | PACK | Freq: Every day | ORAL | 0 refills | Status: DC | PRN

## 2020-01-03 MED ORDER — OXYCODONE HCL 5 MG PO TABS: 5.0000 mg | ORAL_TABLET | ORAL | 0 refills | Status: DC | PRN

## 2020-01-03 MED ORDER — SUCCINYLCHOLINE 20MG/ML (10ML) SYRINGE FOR MEDFUSION PUMP - OPTIME
INTRAMUSCULAR | Status: DC | PRN
  Administered 2020-01-03: 100 mg via INTRAVENOUS

## 2020-01-03 MED ORDER — DOCUSATE SODIUM 100 MG PO CAPS: 100.0000 mg | ORAL_CAPSULE | Freq: Two times a day (BID) | ORAL | 2 refills | Status: DC

## 2020-01-03 MED ORDER — FENTANYL CITRATE (PF) 100 MCG/2ML IJ SOLN
INTRAMUSCULAR | Status: DC | PRN
  Administered 2020-01-03 (×5): 50 ug via INTRAVENOUS

## 2020-01-03 MED ORDER — SUCCINYLCHOLINE CHLORIDE 200 MG/10ML IV SOSY
PREFILLED_SYRINGE | INTRAVENOUS | Status: AC
  Filled 2020-01-03: qty 10

## 2020-01-03 MED ORDER — BUPIVACAINE LIPOSOME 1.3 % IJ SUSP
INTRAMUSCULAR | Status: DC | PRN
  Administered 2020-01-03: 20 mL

## 2020-01-03 MED ORDER — BUPIVACAINE HCL (PF) 0.5 % IJ SOLN
INTRAMUSCULAR | Status: AC
  Filled 2020-01-03: qty 30

## 2020-01-03 MED ORDER — LIDOCAINE HCL (CARDIAC) PF 50 MG/5ML IV SOSY
PREFILLED_SYRINGE | INTRAVENOUS | Status: DC | PRN
  Administered 2020-01-03: 60 mg via INTRAVENOUS

## 2020-01-03 MED ORDER — HEMOSTATIC AGENTS (NO CHARGE) OPTIME
TOPICAL | Status: DC | PRN
  Administered 2020-01-03: 1 via TOPICAL

## 2020-01-03 SURGICAL SUPPLY — 53 items
ADH SKN CLS APL DERMABOND .7 (GAUZE/BANDAGES/DRESSINGS) ×1
APL PRP STRL LF DISP 70% ISPRP (MISCELLANEOUS) ×1
APPLIER CLIP ROT 10 11.4 M/L (STAPLE) ×2
APR CLP MED LRG 11.4X10 (STAPLE) ×1
BAG RETRIEVAL 10 (BASKET) ×1
BLADE SURG 15 STRL LF DISP TIS (BLADE) ×1 IMPLANT
BLADE SURG 15 STRL SS (BLADE) ×2
CHLORAPREP W/TINT 26 (MISCELLANEOUS) ×2 IMPLANT
CLIP APPLIE ROT 10 11.4 M/L (STAPLE) ×1 IMPLANT
CLOTH BEACON ORANGE TIMEOUT ST (SAFETY) ×2 IMPLANT
COVER LIGHT HANDLE STERIS (MISCELLANEOUS) ×4 IMPLANT
COVER WAND RF STERILE (DRAPES) ×2 IMPLANT
DERMABOND ADVANCED (GAUZE/BANDAGES/DRESSINGS) ×1
DERMABOND ADVANCED .7 DNX12 (GAUZE/BANDAGES/DRESSINGS) ×1 IMPLANT
ELECT REM PT RETURN 9FT ADLT (ELECTROSURGICAL) ×2
ELECTRODE REM PT RTRN 9FT ADLT (ELECTROSURGICAL) ×1 IMPLANT
GLOVE BIO SURGEON STRL SZ 6.5 (GLOVE) ×2 IMPLANT
GLOVE BIO SURGEON STRL SZ7 (GLOVE) ×1 IMPLANT
GLOVE BIO SURGEON STRL SZ7.5 (GLOVE) ×1 IMPLANT
GLOVE BIOGEL PI IND STRL 6.5 (GLOVE) ×1 IMPLANT
GLOVE BIOGEL PI IND STRL 7.0 (GLOVE) ×3 IMPLANT
GLOVE BIOGEL PI IND STRL 7.5 (GLOVE) ×1 IMPLANT
GLOVE BIOGEL PI INDICATOR 6.5 (GLOVE) ×1
GLOVE BIOGEL PI INDICATOR 7.0 (GLOVE) ×3
GLOVE BIOGEL PI INDICATOR 7.5 (GLOVE) ×1
GLOVE SURG SS PI 7.5 STRL IVOR (GLOVE) ×1 IMPLANT
GOWN STRL REUS W/TWL LRG LVL3 (GOWN DISPOSABLE) ×6 IMPLANT
GOWN STRL REUS W/TWL XL LVL3 (GOWN DISPOSABLE) ×2 IMPLANT
HEMOSTAT SNOW SURGICEL 2X4 (HEMOSTASIS) ×2 IMPLANT
INST SET LAPROSCOPIC AP (KITS) ×2 IMPLANT
KIT TURNOVER KIT A (KITS) ×2 IMPLANT
MANIFOLD NEPTUNE II (INSTRUMENTS) ×2 IMPLANT
NDL HYPO 25X1 1.5 SAFETY (NEEDLE) IMPLANT
NDL INSUFFLATION 14GA 120MM (NEEDLE) ×1 IMPLANT
NEEDLE HYPO 18GX1.5 BLUNT FILL (NEEDLE) ×2 IMPLANT
NEEDLE HYPO 25X1 1.5 SAFETY (NEEDLE) ×2 IMPLANT
NEEDLE INSUFFLATION 14GA 120MM (NEEDLE) ×2 IMPLANT
NS IRRIG 1000ML POUR BTL (IV SOLUTION) ×2 IMPLANT
PACK LAP CHOLE LZT030E (CUSTOM PROCEDURE TRAY) ×2 IMPLANT
PAD ARMBOARD 7.5X6 YLW CONV (MISCELLANEOUS) ×2 IMPLANT
SET BASIN LINEN APH (SET/KITS/TRAYS/PACK) ×2 IMPLANT
SET TUBE SMOKE EVAC HIGH FLOW (TUBING) ×2 IMPLANT
SLEEVE ENDOPATH XCEL 5M (ENDOMECHANICALS) ×2 IMPLANT
SUT MNCRL AB 4-0 PS2 18 (SUTURE) ×3 IMPLANT
SUT VICRYL 0 UR6 27IN ABS (SUTURE) ×2 IMPLANT
SYR 30ML LL (SYRINGE) ×2 IMPLANT
SYS BAG RETRIEVAL 10MM (BASKET) ×1
SYSTEM BAG RETRIEVAL 10MM (BASKET) ×1 IMPLANT
TROCAR ENDO BLADELESS 11MM (ENDOMECHANICALS) ×2 IMPLANT
TROCAR XCEL NON-BLD 5MMX100MML (ENDOMECHANICALS) ×2 IMPLANT
TROCAR XCEL UNIV SLVE 11M 100M (ENDOMECHANICALS) ×2 IMPLANT
TUBE CONNECTING 12X1/4 (SUCTIONS) ×2 IMPLANT
WARMER LAPAROSCOPE (MISCELLANEOUS) ×2 IMPLANT

## 2020-01-03 NOTE — Transfer of Care (Signed)
Immediate Anesthesia Transfer of Care Note  Patient: [name]  Procedure(s) Performed: LAPAROSCOPIC CHOLECYSTECTOMY (N/A Abdomen)  Patient Location: PACU  Anesthesia Type:General  Level of Consciousness: awake  Airway & Oxygen Therapy: Patient Spontanous Breathing  Post-op Assessment: Report given to RN  Post vital signs: Reviewed  Last Vitals:  Vitals Value Taken Time  BP    Temp    Pulse 86 01/03/20 0834  Resp 26 01/03/20 0834  SpO2 92 % 01/03/20 0834  Vitals shown include unvalidated device data.  Last Pain:  Vitals:   01/03/20 0647  TempSrc: Oral  PainSc: 2       Patients Stated Pain Goal: 6 (01/03/20 3875)  Complications: No apparent anesthesia complications

## 2020-01-03 NOTE — Interval H&P Note (Signed)
History and Physical Interval Note:  01/03/2020 7:21 AM  Victoria Williams  has presented today for surgery, with the diagnosis of Gallbladder sludge.  The various methods of treatment have been discussed with the patient and family. After consideration of risks, benefits and other options for treatment, the patient has consented to  Procedure(s): LAPAROSCOPIC CHOLECYSTECTOMY (N/A) as a surgical intervention.  The patient's history has been reviewed, patient examined, no change in status, stable for surgery.  I have reviewed the patient's chart and labs.  Questions were answered to the patient's satisfaction.    Was constipated this week. Had to go to ED to get enema. Now having Bms. Victoria Williams

## 2020-01-03 NOTE — Anesthesia Procedure Notes (Signed)
Procedure Name: Intubation Date/Time: 01/03/2020 7:39 AM Performed by: Moshe Salisbury, CRNA Pre-anesthesia Checklist: Patient identified, Patient being monitored, Timeout performed, Emergency Drugs available and Suction available Patient Re-evaluated:Patient Re-evaluated prior to induction Oxygen Delivery Method: Circle system utilized Preoxygenation: Pre-oxygenation with 100% oxygen Induction Type: IV induction Ventilation: Mask ventilation without difficulty Laryngoscope Size: Glidescope (S3) Grade View: Grade I Tube type: Oral Tube size: 7.0 mm Number of attempts: 1 Airway Equipment and Method: Stylet Placement Confirmation: ETT inserted through vocal cords under direct vision,  positive ETCO2 and breath sounds checked- equal and bilateral Secured at: 21 cm Tube secured with: Tape Dental Injury: Teeth and Oropharynx as per pre-operative assessment

## 2020-01-03 NOTE — Discharge Instructions (Signed)
Discharge Laparoscopic Surgery Instructions:  Common Complaints: Right shoulder pain is common after laparoscopic surgery. This is secondary to the gas used in the surgery being trapped under the diaphragm.  Walk to help your body absorb the gas. This will improve in a few days. Pain at the port sites are common, especially the larger port sites. This will improve with time.  Some nausea is common and poor appetite. The main goal is to stay hydrated the first few days after surgery.   Diet/ Activity: Diet as tolerated. You may not have an appetite, but it is important to stay hydrated. Drink 64 ounces of water a day. Your appetite will return with time.  Shower per your regular routine daily.  Do not take hot showers. Take warm showers that are less than 10 minutes. Rest and listen to your body, but do not remain in bed all day.  Walk everyday for at least 15-20 minutes. Deep cough and move around every 1-2 hours in the first few days after surgery.  Do not lift > 10 lbs, perform excessive bending, pushing, pulling, squatting for 1-2 weeks after surgery.  Do not pick at the dermabond glue on your incision sites.  This glue film will remain in place for 1-2 weeks and will start to peel off.  Do not place lotions or balms on your incision unless instructed to specifically by Dr. Constance Haw.   Pain Expectations and Narcotics: -After surgery you will have pain associated with your incisions and this is normal. The pain is muscular and nerve pain, and will get better with time. -You are encouraged and expected to take non narcotic medications like tylenol and ibuprofen (when able) to treat pain as multiple modalities can aid with pain treatment. -Narcotics are only used when pain is severe or there is breakthrough pain. -You are not expected to have a pain score of 0 after surgery, as we cannot prevent pain. A pain score of 3-4 that allows you to be functional, move, walk, and tolerate some activity is  the goal. The pain will continue to improve over the days after surgery and is dependent on your surgery. -Due to [place] law, we are only able to give a certain amount of pain medication to treat post operative pain, and we only give additional narcotics on a patient by patient basis.  -For most laparoscopic surgery, studies have shown that the majority of patients only need 10-15 narcotic pills, and for open surgeries most patients only need 15-20.   -Having appropriate expectations of pain and knowledge of pain management with non narcotics is important as we do not want anyone to become addicted to narcotic pain medication.  -Using ice packs in the first 48 hours and heating pads after 48 hours, wearing an abdominal binder (when recommended), and using over the counter medications are all ways to help with pain management.   -Simple acts like meditation and mindfulness practices after surgery can also help with pain control and research has proven the benefit of these practices.  Medication: Take tylenol and ibuprofen as needed for pain control, alternating every 4-6 hours.  Example:  Tylenol 1000mg  @ 6am, 12noon, 6pm, 38midnight (Do not exceed 4000mg  of tylenol a day). Ibuprofen 800mg  @ 9am, 3pm, 9pm, 3am (Do not exceed 3600mg  of ibuprofen a day).  Take Roxicodone for breakthrough pain every 4 hours.  Take Colace for constipation related to narcotic pain medication. If you do not have a bowel movement in 1 days, take Miralax  over the counter.  Drink plenty of water to also prevent constipation.   Contact Information: If you have questions or concerns, please call our office, [phone], Monday- Thursday 8AM-5PM and Friday 8AM-12Noon.  If it is after hours or on the weekend, please call [facility], [phone], and ask to speak to the surgeon on call for Dr. Henreitta Leber at [facility].    Laparoscopic Cholecystectomy, Care After This sheet gives you information about how to care for  yourself after your procedure. Your health care provider may also give you more specific instructions. If you have problems or questions, contact your health care provider. What can I expect after the procedure? After the procedure, it is common to have:  Pain at your incision sites. You will be given medicines to control this pain.  Mild nausea or vomiting.  Bloating and possible shoulder pain from the air-like gas that was used during the procedure. Follow these instructions at home: Incision care   Follow instructions from your health care provider about how to take care of your incisions. Make sure you: ? Wash your hands with soap and water before you change your bandage (dressing). If soap and water are not available, use hand sanitizer. ? Change your dressing as told by your health care provider. ? Leave stitches (sutures), skin glue, or adhesive strips in place. These skin closures may need to be in place for 2 weeks or longer. If adhesive strip edges start to loosen and curl up, you may trim the loose edges. Do not remove adhesive strips completely unless your health care provider tells you to do that.  Do not take baths, swim, or use a hot tub until your health care provider approves.  You may shower.  Check your incision area every day for signs of infection. Check for: ? More redness, swelling, or pain. ? More fluid or blood. ? Warmth. ? Pus or a bad smell. Activity  Do not drive or use heavy machinery while taking prescription pain medicine.  Do not lift anything that is heavier than 10 lb (4.5 kg) until your health care provider approves.  Do not play contact sports until your health care provider approves.  Do not drive for 24 hours if you were given a medicine to help you relax (sedative).  Rest as needed. Do not return to work or school until your health care provider approves. General instructions  Take over-the-counter and prescription medicines only as told  by your health care provider.  To prevent or treat constipation while you are taking prescription pain medicine, your health care provider may recommend that you: ? Drink enough fluid to keep your urine clear or pale yellow. ? Take over-the-counter or prescription medicines. ? Eat foods that are high in fiber, such as fresh fruits and vegetables, whole grains, and beans. ? Limit foods that are high in fat and processed sugars, such as fried and sweet foods. Contact a health care provider if:  You develop a rash.  You have more redness, swelling, or pain around your incisions.  You have more fluid or blood coming from your incisions.  Your incisions feel warm to the touch.  You have pus or a bad smell coming from your incisions.  You have a fever.  One or more of your incisions breaks open. Get help right away if:  You have trouble breathing.  You have chest pain.  You have increasing pain in your shoulders.  You faint or feel dizzy when you  stand.  You have severe pain in your abdomen.  You have nausea or vomiting that lasts for more than one day.  You have leg pain.   General Anesthesia, Adult, Care After This sheet gives you information about how to care for yourself after your procedure. Your health care provider may also give you more specific instructions. If you have problems or questions, contact your health care provider. What can I expect after the procedure? After the procedure, the following side effects are common:  Pain or discomfort at the IV site.  Nausea.  Vomiting.  Sore throat.  Trouble concentrating.  Feeling cold or chills.  Weak or tired.  Sleepiness and fatigue.  Soreness and body aches. These side effects can affect parts of the body that were not involved in surgery. Follow these instructions at home:  For at least 24 hours after the procedure:  Have a responsible adult stay with you. It is important to have someone help care  for you until you are awake and alert.  Rest as needed.  Do not: ? Participate in activities in which you could fall or become injured. ? Drive. ? Use heavy machinery. ? Drink alcohol. ? Take sleeping pills or medicines that cause drowsiness. ? Make important decisions or sign legal documents. ? Take care of children on your own. Eating and drinking  Follow any instructions from your health care provider about eating or drinking restrictions.  When you feel hungry, start by eating small amounts of foods that are soft and easy to digest (bland), such as toast. Gradually return to your regular diet.  Drink enough fluid to keep your urine pale yellow.  If you vomit, rehydrate by drinking water, juice, or clear broth. General instructions  If you have sleep apnea, surgery and certain medicines can increase your risk for breathing problems. Follow instructions from your health care provider about wearing your sleep device: ? Anytime you are sleeping, including during daytime naps. ? While taking prescription pain medicines, sleeping medicines, or medicines that make you drowsy.  Return to your normal activities as told by your health care provider. Ask your health care provider what activities are safe for you.  Take over-the-counter and prescription medicines only as told by your health care provider.  If you smoke, do not smoke without supervision.  Keep all follow-up visits as told by your health care provider. This is important. Contact a health care provider if:  You have nausea or vomiting that does not get better with medicine.  You cannot eat or drink without vomiting.  You have pain that does not get better with medicine.  You are unable to pass urine.  You develop a skin rash.  You have a fever.  You have redness around your IV site that gets worse. Get help right away if:  You have difficulty breathing.  You have chest pain.  You have blood in your urine  or stool, or you vomit blood. Summary  After the procedure, it is common to have a sore throat or nausea. It is also common to feel tired.  Have a responsible adult stay with you for the first 24 hours after general anesthesia. It is important to have someone help care for you until you are awake and alert.  When you feel hungry, start by eating small amounts of foods that are soft and easy to digest (bland), such as toast. Gradually return to your regular diet.  Drink enough fluid to keep your urine  pale yellow.  Return to your normal activities as told by your health care provider. Ask your health care provider what activities are safe for you. This information is not intended to replace advice given to you by your health care provider. Make sure you discuss any questions you have with your health care provider. Document Revised: 10/13/2017 Document Reviewed: 05/26/2017 Elsevier Patient Education  [employer].  This information is not intended to replace advice given to you by your health care provider. Make sure you discuss any questions you have with your health care provider. Document Revised: 09/22/2017 Document Reviewed: 03/28/2016 Elsevier Patient Education  2020 [employer].

## 2020-01-03 NOTE — Anesthesia Preprocedure Evaluation (Signed)
Anesthesia Evaluation  Patient identified by MRN, date of birth, ID band Patient awake    Reviewed: Allergy & Precautions, H&P , NPO status , Patient's Chart, lab work & pertinent test results, reviewed documented beta blocker date and time   Airway Mallampati: III       Dental  (+) Teeth Intact   Pulmonary sleep apnea ,    Pulmonary exam normal        Cardiovascular hypertension, Normal cardiovascular exam     Neuro/Psych  Neuromuscular disease    GI/Hepatic negative GI ROS, Neg liver ROS,   Endo/Other  diabetes, Type 2  Renal/GU CRFRenal disease     Musculoskeletal   Abdominal   Peds  Hematology negative hematology ROS (+)   Anesthesia Other Findings   Reproductive/Obstetrics                             Anesthesia Physical Anesthesia Plan  ASA: III  Anesthesia Plan: General   Post-op Pain Management:    Induction:   PONV Risk Score and Plan: 3 and Ondansetron  Airway Management Planned:   Additional Equipment:   Intra-op Plan:   Post-operative Plan:   Informed Consent: I have reviewed the patients History and Physical, chart, labs and discussed the procedure including the risks, benefits and alternatives for the proposed anesthesia with the patient or authorized representative who has indicated his/her understanding and acceptance.       Plan Discussed with: CRNA  Anesthesia Plan Comments:         Anesthesia Quick Evaluation

## 2020-01-03 NOTE — Progress Notes (Signed)
[facility]  Talked to husband. Surgery complete. Patient needs to make sure having BMs. May need miralax if stools are not loose and daily.  Will get referred to GI given the constipation and diarrhea and fact she has not had a colonoscopy in 5 years and is due in 2021.  She had prior colonoscopy with [facility] with Dr. Gery Pray when working in [place].   Will refer to GI.  Algis Greenhouse, MD [facility] [address] [place] [place], [place] [address] [phone] (office)

## 2020-01-03 NOTE — Anesthesia Postprocedure Evaluation (Signed)
Anesthesia Post Note  Patient: Victoria Williams  Procedure(s) Performed: LAPAROSCOPIC CHOLECYSTECTOMY (N/A Abdomen)  Patient location during evaluation: PACU Anesthesia Type: General Level of consciousness: awake and alert and oriented Pain management: pain level controlled Vital Signs Assessment: post-procedure vital signs reviewed and stable Respiratory status: spontaneous breathing Cardiovascular status: blood pressure returned to baseline and stable Postop Assessment: no apparent nausea or vomiting Anesthetic complications: no     Last Vitals:  Vitals:   01/03/20 0915 01/03/20 0922  BP: 126/76 121/71  Pulse: 77 76  Resp: 16 16  Temp:  36.6 C  SpO2: 93% 93%    Last Pain:  Vitals:   01/03/20 0922  TempSrc: Oral  PainSc: 4                  [name]

## 2020-01-03 NOTE — Op Note (Signed)
Operative Note   Preoperative Diagnosis: Gallbladder sludge    Postoperative Diagnosis: Same   Procedure(s) Performed: Laparoscopic cholecystectomy   Surgeon: Lillia Abed C. Henreitta Leber, MD   Assistants: Franky Macho, MD    Anesthesia: General endotracheal   Anesthesiologist: Windell Norfolk, MD    Specimens: Gallbladder    Estimated Blood Loss: Minimal    Blood Replacement: None    Complications: None    Operative Findings:  Branching cystic artery with posterior branch that was clipped in addition   Procedure: The patient was taken to the operating room and placed supine. General endotracheal anesthesia was induced. Intravenous antibiotics were administered per protocol. An orogastric tube positioned to decompress the stomach. The abdomen was prepared and draped in the usual sterile fashion.    A supraumbilical incision was made and a Veress technique was utilized to achieve pneumoperitoneum to 15 mmHg with carbon dioxide. A 11 mm optiview port was placed through the supraumbilical region, and a 10 mm 0-degree operative laparoscope was introduced. The area underlying the trocar and Veress needle were inspected and without evidence of injury.  Remaining trocars were placed under direct vision. Two 5 mm ports were placed in the right abdomen, between the anterior axillary and midclavicular line.  A final 11 mm port was placed through the mid-epigastrium, near the falciform ligament.    The gallbladder fundus was elevated cephalad and the infundibulum was retracted to the patient's right. The gallbladder/cystic duct junction was skeletonized. The cystic artery noted in the triangle of Calot and was also skeletonized.  There was a posterior branch going up posterior and lateral into the wall of the gallbladder. This was clipped and cut to ensure it did not rip and to allow for continued lateral mobilization.  We then continued liberal medial and lateral dissection until the critical view of  safety was achieved.    The cystic duct and cystic artery were doubly clipped and divided. The gallbladder was then dissected from the liver bed with electrocautery. The specimen was placed in an Endopouch and was retrieved through the epigastric site.   Final inspection revealed acceptable hemostasis. Surgical SNOW was placed in the gallbladder bed.  Trocars were removed and pneumoperitoneum was released.  The umbilical and epigastric site were smaller than my finger.  Skin incisions were closed with 4-0 Monocryl subcuticular sutures and Dermabond. The patient was awakened from anesthesia and extubated without complication.    Algis Greenhouse, MD [facility] [address] [place], [place] [address] [phone] (office)

## 2020-01-06 LAB — SURGICAL PATHOLOGY

## 2020-01-10 DIAGNOSIS — G4733 Obstructive sleep apnea (adult) (pediatric): Secondary | ICD-10-CM | POA: Diagnosis not present

## 2020-01-29 ENCOUNTER — Ambulatory Visit: Payer: BC Managed Care – PPO | Admitting: Nurse Practitioner

## 2020-02-11 ENCOUNTER — Encounter: Payer: Self-pay | Admitting: Nurse Practitioner

## 2020-02-11 ENCOUNTER — Other Ambulatory Visit: Payer: Self-pay

## 2020-02-11 ENCOUNTER — Encounter: Payer: Self-pay | Admitting: *Deleted

## 2020-02-11 ENCOUNTER — Ambulatory Visit: Payer: BC Managed Care – PPO | Admitting: Nurse Practitioner

## 2020-02-11 DIAGNOSIS — K5900 Constipation, unspecified: Secondary | ICD-10-CM | POA: Diagnosis not present

## 2020-02-11 DIAGNOSIS — Z86010 Personal history of colonic polyps: Secondary | ICD-10-CM | POA: Diagnosis not present

## 2020-02-11 MED ORDER — NA SULFATE-K SULFATE-MG SULF 17.5-3.13-1.6 GM/177ML PO SOLN: 1.0000 | Freq: Once | ORAL | 0 refills | Status: AC

## 2020-02-11 NOTE — Progress Notes (Signed)
Primary Care Physician:  Assunta Found, MD Primary Gastroenterologist:  Dr. Jena Gauss  Chief Complaint  Patient presents with  . Constipation    had gallbladder removed last month    HPI:   Victoria Williams is a 54 y.o. female who presents on referral from primary care for constipation and to schedule colonoscopy.   Of note the patient was in the emergency department 01/01/2020 for constipation.  Noted decrease stool output with hard stools in the rectum that she cannot pass as well as lower abdominal pain and nausea.  She was passing flatus.  Has tried home medications including suppositories and an enema as well as mag citrate without relief.  CT found redundant large bowel but mild retained stool limited to the sigmoid and rectum otherwise retained fluid compatible with recent enema.  She received soapsuds enema and IV fluid and responded with multiple bowel movements and feeling much better.  Also note recent laparoscopic cholecystectomy 01/03/2020.  She has had an EGD in our system by Dr. Arlyce Dice in 2012 which found gastritis and recommended Dexilant. Colonoscopy in 2016 at [facility] with Dr. Gery Pray which revealed tubular adenoma polyps with recommended repeat exam in 2021.  Today she states she's doing ok overall. Had gallbladder removed last month. Has had improved GB symptoms since cholecystectomy. Still constipated and needing docusate. Admits inadequate water intake. Not eating 3-4 fruits/veggies a day. Feels OTC stool softener helps. Will go 2-3 days between bowel movements before she takes the stool softener. Intermittent bloating with constipation. Denies ongoing abdominal pain, N/V, hematochezia, melena, fever, chills, unintentional weight loss. Denies URI or flu-like symptoms. Has not had a COVID-19 vaccine, not interested in it at this time. Denies loss of sense of taste or smell. Denies chest pain, dyspnea, dizziness, lightheadedness, syncope, near syncope. Denies any other upper or lower  GI symptoms.  Was started on dicyclomine for UGI symptoms which is helping.  Past Medical History:  Diagnosis Date  . Arthritis   . Bell's palsy    approx 2015  . Chronic kidney disease   . Diabetes mellitus    only when on steriods  . Esophagitis 2012   EGD  . Family history of adverse reaction to anesthesia    PONV and hallucinations-MOM  . Henoch-Schonlein purpura (HCC)   . History of kidney stones   . History of UTI   . Hypertension   . IBS (irritable bowel syndrome)   . Obesity   . Sleep apnea     Past Surgical History:  Procedure Laterality Date  . ABDOMINAL HYSTERECTOMY  2003  . ACHILLES TENDON SURGERY Left   . CESAREAN SECTION    . CHOLECYSTECTOMY N/A 01/03/2020   Procedure: LAPAROSCOPIC CHOLECYSTECTOMY;  Surgeon: Lucretia Roers, MD;  Location: [facility];  Service: General;  Laterality: N/A;  . KNEE SURGERY Left    arthroscopic  . NECK SURGERY  2000   cervical fusion 6/7  . REPLACEMENT TOTAL KNEE Left 2019   [facility]  . TONSILECTOMY, ADENOIDECTOMY, BILATERAL MYRINGOTOMY AND TUBES    . TONSILLECTOMY    . TUBAL LIGATION  1996    Current Outpatient Medications  Medication Sig Dispense Refill  . acetaminophen (TYLENOL) 500 MG tablet Take 1,000 mg by mouth 2 (two) times daily as needed for headache.    . calcium carbonate (TUMS - DOSED IN MG ELEMENTAL CALCIUM) 500 MG chewable tablet Chew 1 tablet by mouth daily as needed for indigestion or heartburn.     . dicyclomine (BENTYL)  10 MG capsule Take 10 mg by mouth 2 (two) times daily.   0  . docusate sodium (COLACE) 100 MG capsule Take 1 capsule (100 mg total) by mouth 2 (two) times daily. (Patient taking differently: Take 100 mg by mouth daily. ) 60 capsule 2  . glipiZIDE (GLUCOTROL) 5 MG tablet Take 5 mg by mouth 2 (two) times daily.    [name] ibuprofen (ADVIL,MOTRIN) 200 MG tablet Take 600 mg by mouth 2 (two) times daily as needed for moderate pain.    [name] losartan-hydrochlorothiazide (HYZAAR) 100-25 MG tablet Take 1  tablet by mouth daily.     . ondansetron (ZOFRAN ODT) 4 MG disintegrating tablet Take 1 tablet (4 mg total) by mouth every 8 (eight) hours as needed for nausea or vomiting. 20 tablet 1  . oxyCODONE (ROXICODONE) 5 MG immediate release tablet Take 1 tablet (5 mg total) by mouth every 4 (four) hours as needed for severe pain or breakthrough pain. 15 tablet 0  . polyethylene glycol (MIRALAX) 17 g packet Take 17 g by mouth daily as needed for moderate constipation. 14 each 0  . tolterodine (DETROL) 2 MG tablet Take 2 mg by mouth 2 (two) times daily.    . potassium chloride 20 MEQ TBCR Take 20 mEq by mouth 2 (two) times daily for 3 days. (Patient not taking: Reported on 01/01/2020) 6 tablet 0   No current facility-administered medications for this visit.    Allergies as of 02/11/2020 - Review Complete 02/11/2020  Allergen Reaction Noted  . Hydromorphone hcl Nausea And Vomiting 03/08/2011  . Sulfa antibiotics Nausea And Vomiting 03/08/2011  . Cephalexin Hives 03/08/2011  . Ciprocin-fluocin-procin [fluocinolone acetonide] Hives 03/08/2011  . Bee venom Swelling 04/26/2012  . Levofloxacin Hives 03/08/2011  . Celebrex [celecoxib] Rash 04/18/2018  . Penicillins Rash 03/08/2011    Family History  Problem Relation Age of Onset  . Leukemia Mother   . Diabetes Mother   . Breast cancer Mother   . Esophageal cancer Brother   . Diabetes Maternal Aunt   . Diabetes Maternal Uncle   . Diabetes Maternal Grandfather   . Heart attack Father   . Colon cancer Neg Hx     Social History   Socioeconomic History  . Marital status: Married    Spouse name: Not on file  . Number of children: 3  . Years of education: some college  . Highest education level: Not on file  Occupational History    Comment: [employer], loan processor  Tobacco Use  . Smoking status: Never Smoker  . Smokeless tobacco: Never Used  Substance and Sexual Activity  . Alcohol use: Yes    Alcohol/week: 1.0 standard drinks    Types: 1  Glasses of wine per week    Comment: 2 a month   . Drug use: No  . Sexual activity: Yes    Birth control/protection: Surgical  Other Topics Concern  . Not on file  Social History Narrative   Lives with spouse   2 caffeine drinks daily    Social Determinants of Health   Financial Resource Strain:   . Difficulty of Paying Living Expenses:   Food Insecurity:   . Worried About Programme researcher, broadcasting/film/video in the Last Year:   . Barista in the Last Year:   Transportation Needs:   . Freight forwarder (Medical):   [name] Lack of Transportation (Non-Medical):   Physical Activity:   . Days of Exercise per Week:   .  Minutes of Exercise per Session:   Stress:   . Feeling of Stress :   Social Connections:   . Frequency of Communication with Friends and Family:   . Frequency of Social Gatherings with Friends and Family:   . Attends Religious Services:   . Active Member of Clubs or Organizations:   . Attends Archivist Meetings:   [name] Marital Status:   Intimate Partner Violence:   . Fear of Current or Ex-Partner:   . Emotionally Abused:   [name] Physically Abused:   . Sexually Abused:     Subjective: Review of Systems  Constitutional: Negative for chills, fever, malaise/fatigue and weight loss.  HENT: Negative for congestion and sore throat.   Respiratory: Negative for cough and shortness of breath.   Cardiovascular: Negative for chest pain and palpitations.  Gastrointestinal: Positive for constipation. Negative for abdominal pain, blood in stool, diarrhea, melena, nausea and vomiting.  Musculoskeletal: Negative for joint pain and myalgias.  Skin: Negative for rash.  Neurological: Negative for dizziness and weakness.  Endo/Heme/Allergies: Does not bruise/bleed easily.  Psychiatric/Behavioral: Negative for depression. The patient is not nervous/anxious.   All other systems reviewed and are negative.      Objective: BP 109/74   Pulse 86   Temp (!) 96.6 F (35.9 C)  (Temporal)   Ht 5\' 4"  (1.626 m)   Wt 219 lb 3.2 oz (99.4 kg)   BMI 37.63 kg/m  Physical Exam Vitals and nursing note reviewed.  Constitutional:      General: She is not in acute distress.    Appearance: Normal appearance. She is well-developed. She is not ill-appearing, toxic-appearing or diaphoretic.  HENT:     Head: Normocephalic and atraumatic.     Nose: No congestion or rhinorrhea.  Eyes:     General: No scleral icterus. Cardiovascular:     Rate and Rhythm: Normal rate and regular rhythm.     Heart sounds: Normal heart sounds.  Pulmonary:     Effort: Pulmonary effort is normal. No respiratory distress.     Breath sounds: Normal breath sounds.  Abdominal:     General: Bowel sounds are normal.     Palpations: Abdomen is soft. There is no hepatomegaly, splenomegaly or mass.     Tenderness: There is no abdominal tenderness. There is no guarding or rebound.     Hernia: No hernia is present.  Skin:    General: Skin is warm and dry.     Coloration: Skin is not jaundiced.     Findings: No rash.  Neurological:     General: No focal deficit present.     Mental Status: She is alert and oriented to person, place, and time.  Psychiatric:        Attention and Perception: Attention normal.        Mood and Affect: Mood normal.        Speech: Speech normal.        Behavior: Behavior normal.        Thought Content: Thought content normal.        Cognition and Memory: Cognition and memory normal.       02/11/2020 9:16 AM   Disclaimer: This note was dictated with voice recognition software. Similar sounding words can inadvertently be transcribed and may not be corrected upon review.

## 2020-02-11 NOTE — Patient Instructions (Addendum)
Your health issues we discussed today were:   Constipation: 1. Start taking MiraLAX once a day.  You can increase this to a second dose later in the day, if needed.  This will not only provide you with a stool softener/fiber, but will increase your water intake as well 2. Call us in 1 to 2 weeks and let us know if this is helping your bowel movements.  If not, we can discuss other options and recommendations 3. Call us for any worsening or severe symptoms  Need for colonoscopy: 1. We will schedule your colonoscopy for you 2. Further recommendations will follow your colonoscopy  Overall I recommend:  1. Continue your other current medications 2. Return for follow-up in 3 months 3. Call us if you have any questions or concerns.   At [facility] we value your feedback. You may receive a survey about your visit today. Please share your experience as we strive to create trusting relationships with our patients to provide genuine, compassionate, quality care.  We appreciate your understanding and patience as we review any laboratory studies, imaging, and other diagnostic tests that are ordered as we care for you. Our office policy is 5 business days for review of these results, and any emergent or urgent results are addressed in a timely manner for your best interest. If you do not hear from our office in 1 week, please contact us.   We also encourage the use of MyChart, which contains your medical information for your review as well. If you are not enrolled in this feature, an access code is on this after visit summary for your convenience. Thank you for allowing [place] to be involved in your care.  It was great to see you today!  I hope you have a great Summer!!

## 2020-02-17 DIAGNOSIS — K5900 Constipation, unspecified: Secondary | ICD-10-CM | POA: Insufficient documentation

## 2020-02-17 DIAGNOSIS — Z86010 Personal history of colonic polyps: Secondary | ICD-10-CM | POA: Insufficient documentation

## 2020-02-17 DIAGNOSIS — Z860101 Personal history of adenomatous and serrated colon polyps: Secondary | ICD-10-CM | POA: Insufficient documentation

## 2020-02-17 NOTE — Assessment & Plan Note (Signed)
Most recent colonoscopy in 2016 at [facility] with Dr. Gery Pray revealing tubular adenoma polyps with recommended repeat exam 2021.  She is currently due.  Generally asymptomatic from a GI standpoint other than mild constipation status post cholecystectomy 1 month ago with postoperative pain medication.  Further constipation management as per below.  At this point we can proceed with colonoscopy.  Proceed with TCS with Dr. Jena Gauss in near future: the risks, benefits, and alternatives have been discussed with the patient in detail. The patient states understanding and desires to proceed.  The patient is currently on oxycodone, although this is seldom used and self-limiting for her postoperative course. The patient is not on any other anticoagulants, anxiolytics, chronic pain medications, antidepressants, antidiabetics, or iron supplements.  Conscious sedation should be adequate for her procedure.

## 2020-02-17 NOTE — Assessment & Plan Note (Signed)
The patient notes somewhat mild constipation with a bowel movement every 2 to 3 days in the setting of recent cholecystectomy and pain medication postoperatively.  I do not think she is taking much of her pain medicine.  She is occasionally taking a Dulcolax.  She does use a stool softener which helps.  At this point I have recommended that she continue stool softener daily.  Add MiraLAX once a day, that she can increase to twice a day if needed.  Request progress report in 1 to 2 weeks.  Follow-up in 3 months.  We can consider prescriptive therapy if over-the-counter therapy is not effective.

## 2020-02-20 ENCOUNTER — Other Ambulatory Visit: Payer: Self-pay | Admitting: Obstetrics and Gynecology

## 2020-02-20 DIAGNOSIS — Z1231 Encounter for screening mammogram for malignant neoplasm of breast: Secondary | ICD-10-CM

## 2020-03-17 DIAGNOSIS — Z1231 Encounter for screening mammogram for malignant neoplasm of breast: Secondary | ICD-10-CM

## 2020-04-06 ENCOUNTER — Other Ambulatory Visit: Payer: Self-pay

## 2020-04-06 ENCOUNTER — Ambulatory Visit
Admission: RE | Admit: 2020-04-06 | Discharge: 2020-04-06 | Disposition: A | Discharge status: Home / Self Care | Payer: BC Managed Care – PPO | Source: Ambulatory Visit | Attending: Obstetrics and Gynecology | Admitting: Obstetrics and Gynecology

## 2020-04-06 DIAGNOSIS — Z1231 Encounter for screening mammogram for malignant neoplasm of breast: Secondary | ICD-10-CM

## 2020-04-09 ENCOUNTER — Other Ambulatory Visit: Payer: Self-pay | Admitting: Obstetrics and Gynecology

## 2020-04-09 DIAGNOSIS — R928 Other abnormal and inconclusive findings on diagnostic imaging of breast: Secondary | ICD-10-CM

## 2020-04-16 ENCOUNTER — Ambulatory Visit
Admission: RE | Admit: 2020-04-16 | Discharge: 2020-04-16 | Disposition: A | Discharge status: Home / Self Care | Payer: BC Managed Care – PPO | Source: Ambulatory Visit | Attending: Obstetrics and Gynecology | Admitting: Obstetrics and Gynecology

## 2020-04-16 ENCOUNTER — Other Ambulatory Visit: Payer: Self-pay

## 2020-04-16 DIAGNOSIS — R928 Other abnormal and inconclusive findings on diagnostic imaging of breast: Secondary | ICD-10-CM

## 2020-04-16 DIAGNOSIS — N6489 Other specified disorders of breast: Secondary | ICD-10-CM | POA: Diagnosis not present

## 2020-04-23 ENCOUNTER — Telehealth: Payer: Self-pay

## 2020-04-23 NOTE — Telephone Encounter (Signed)
Pt would like to r/s her TCS. Pt can be reached. [phone]

## 2020-04-23 NOTE — Telephone Encounter (Signed)
Tried to call pt, call went straight to VM, LMOVM for return call. 

## 2020-04-29 ENCOUNTER — Other Ambulatory Visit (HOSPITAL_COMMUNITY)
Admission: RE | Admit: 2020-04-29 | Discharge: 2020-04-29 | Disposition: A | Discharge status: Home / Self Care | Payer: BC Managed Care – PPO | Source: Ambulatory Visit | Attending: Internal Medicine | Admitting: Internal Medicine

## 2020-04-29 ENCOUNTER — Other Ambulatory Visit: Payer: Self-pay

## 2020-04-30 ENCOUNTER — Other Ambulatory Visit (HOSPITAL_COMMUNITY)
Admission: RE | Admit: 2020-04-30 | Discharge: 2020-04-30 | Disposition: A | Discharge status: Home / Self Care | Payer: BC Managed Care – PPO | Source: Ambulatory Visit | Attending: Internal Medicine | Admitting: Internal Medicine

## 2020-04-30 NOTE — Progress Notes (Signed)
I called patient yesterday to ask her why she didn't show up for her appointment. I left a voice mail. I went ahead and rescheduled her appointment for this morning. I called patient back since I hadn't heard from her. She informed me the Dr.'s office cancelled the procedure. The procedure has NOT been cancelled. (Colonoscopy is still scheduled for 05/01/2020)

## 2020-05-01 ENCOUNTER — Ambulatory Visit (HOSPITAL_COMMUNITY)
Admission: RE | Admit: 2020-05-01 | Payer: BC Managed Care – PPO | Source: Home / Self Care | Admitting: Internal Medicine

## 2020-05-01 ENCOUNTER — Encounter (HOSPITAL_COMMUNITY): Admission: RE | Payer: Self-pay | Source: Home / Self Care

## 2020-05-01 ENCOUNTER — Telehealth: Payer: Self-pay | Admitting: Internal Medicine

## 2020-05-01 SURGERY — COLONOSCOPY
Anesthesia: Moderate Sedation

## 2020-05-01 NOTE — Telephone Encounter (Signed)
Pt was a no show for her covid test and a no show for her procedure this morning with [facility].

## 2020-05-01 NOTE — Telephone Encounter (Signed)
FYI to EG 

## 2020-05-01 NOTE — Telephone Encounter (Signed)
Noted  

## 2020-05-12 ENCOUNTER — Ambulatory Visit: Payer: BC Managed Care – PPO | Admitting: Nurse Practitioner

## 2020-05-19 DIAGNOSIS — G4733 Obstructive sleep apnea (adult) (pediatric): Secondary | ICD-10-CM | POA: Diagnosis not present

## 2020-05-20 ENCOUNTER — Other Ambulatory Visit: Payer: Self-pay

## 2020-05-20 ENCOUNTER — Emergency Department (HOSPITAL_COMMUNITY): Payer: BC Managed Care – PPO

## 2020-05-20 ENCOUNTER — Encounter (HOSPITAL_COMMUNITY): Payer: Self-pay | Admitting: Pediatrics

## 2020-05-20 ENCOUNTER — Emergency Department (HOSPITAL_COMMUNITY)
Admission: EM | Admit: 2020-05-20 | Discharge: 2020-05-21 | Disposition: A | Discharge status: Home / Self Care | Payer: BC Managed Care – PPO | Attending: Emergency Medicine | Admitting: Emergency Medicine

## 2020-05-20 DIAGNOSIS — Z5321 Procedure and treatment not carried out due to patient leaving prior to being seen by health care provider: Secondary | ICD-10-CM | POA: Insufficient documentation

## 2020-05-20 DIAGNOSIS — J9811 Atelectasis: Secondary | ICD-10-CM | POA: Diagnosis not present

## 2020-05-20 DIAGNOSIS — Z1389 Encounter for screening for other disorder: Secondary | ICD-10-CM | POA: Diagnosis not present

## 2020-05-20 DIAGNOSIS — E1165 Type 2 diabetes mellitus with hyperglycemia: Secondary | ICD-10-CM | POA: Diagnosis not present

## 2020-05-20 DIAGNOSIS — R531 Weakness: Secondary | ICD-10-CM | POA: Diagnosis not present

## 2020-05-20 DIAGNOSIS — Z6839 Body mass index (BMI) 39.0-39.9, adult: Secondary | ICD-10-CM | POA: Diagnosis not present

## 2020-05-20 DIAGNOSIS — R079 Chest pain, unspecified: Secondary | ICD-10-CM | POA: Diagnosis not present

## 2020-05-20 DIAGNOSIS — R0789 Other chest pain: Secondary | ICD-10-CM | POA: Diagnosis not present

## 2020-05-20 LAB — BASIC METABOLIC PANEL
Anion gap: 10 (ref 5–15)
BUN: 9 mg/dL (ref 6–20)
CO2: 26 mmol/L (ref 22–32)
Calcium: 9.3 mg/dL (ref 8.9–10.3)
Chloride: 99 mmol/L (ref 98–111)
Creatinine, Ser: 0.8 mg/dL (ref 0.44–1.00)
GFR calc Af Amer: >60 mL/min (ref >60)
GFR calc non Af Amer: >60 mL/min (ref >60)
Glucose, Bld: 300 mg/dL — ABNORMAL HIGH (ref 70–99)
Potassium: 3.4 mmol/L — ABNORMAL LOW (ref 3.5–5.1)
Sodium: 135 mmol/L (ref 135–145)

## 2020-05-20 LAB — CBC
HCT: 42.5 % (ref 36.0–46.0)
Hemoglobin: 13.7 g/dL (ref 12.0–15.0)
MCH: 28.5 pg (ref 26.0–34.0)
MCHC: 32.2 g/dL (ref 30.0–36.0)
MCV: 88.4 fL (ref 80.0–100.0)
Platelets: 356 10*3/uL (ref 150–400)
RBC: 4.81 MIL/uL (ref 3.87–5.11)
RDW: 13.2 % (ref 11.5–15.5)
WBC: 8.6 10*3/uL (ref 4.0–10.5)
nRBC: 0 % (ref 0.0–0.2)

## 2020-05-20 LAB — I-STAT BETA HCG BLOOD, ED (MC, WL, AP ONLY): I-stat hCG, quantitative: <5 m[IU]/mL (ref <5)

## 2020-05-20 LAB — TROPONIN I (HIGH SENSITIVITY)
Troponin I (High Sensitivity): 5 ng/L (ref <18)
Troponin I (High Sensitivity): 3 ng/L (ref <18)

## 2020-05-20 LAB — HEMOGLOBIN A1C: Hemoglobin A1C: 11.4

## 2020-05-20 MED ORDER — SODIUM CHLORIDE 0.9% FLUSH: 3.0000 mL | Freq: Once | INTRAVENOUS | Status: DC

## 2020-05-20 NOTE — ED Triage Notes (Signed)
Stated she was sent by PCP d/t irregular EKG. Patient c/o chest pain for several days.

## 2020-05-20 NOTE — ED Notes (Signed)
Patient wants to leave. Encouraged to stay. Pt refuses to speak to nurse. Seen leaving lobby.

## 2020-07-01 ENCOUNTER — Encounter: Payer: Self-pay | Admitting: "Endocrinology

## 2020-07-01 ENCOUNTER — Other Ambulatory Visit: Payer: Self-pay

## 2020-07-01 ENCOUNTER — Ambulatory Visit: Payer: BC Managed Care – PPO | Admitting: "Endocrinology

## 2020-07-01 VITALS — BP 100/70 | HR 68 | Ht 64.0 in | Wt 219.2 lb

## 2020-07-01 DIAGNOSIS — E1165 Type 2 diabetes mellitus with hyperglycemia: Secondary | ICD-10-CM | POA: Diagnosis not present

## 2020-07-01 DIAGNOSIS — E782 Mixed hyperlipidemia: Secondary | ICD-10-CM | POA: Diagnosis not present

## 2020-07-01 MED ORDER — ACCU-CHEK GUIDE W/DEVICE KIT: 1.0000 | PACK | 0 refills | Status: DC

## 2020-07-01 MED ORDER — ACCU-CHEK GUIDE VI STRP
ORAL_STRIP | 2 refills | Status: DC
Start: 2020-07-01 — End: 2020-10-12

## 2020-07-01 MED ORDER — ROSUVASTATIN CALCIUM 10 MG PO TABS
10.0000 mg | ORAL_TABLET | Freq: Every day | ORAL | 1 refills | Status: DC
Start: 2020-07-01 — End: 2020-11-26

## 2020-07-01 NOTE — Progress Notes (Signed)
Endocrinology Consult Note       07/01/2020, 10:00 AM   Subjective:    Patient ID: Victoria Williams, female    DOB: 1966/08/06.  Victoria Williams is being seen in consultation for management of currently uncontrolled symptomatic diabetes requested by  Sharilyn Sites, MD.   Past Medical History:  Diagnosis Date  . Arthritis   . Bell's palsy    approx 2015  . Chronic kidney disease   . Diabetes mellitus    only when on steriods  . Esophagitis 2012   EGD  . Family history of adverse reaction to anesthesia    PONV and hallucinations-MOM  . Henoch-Schonlein purpura ([place])   . History of kidney stones   . History of UTI   . Hypertension   . IBS (irritable bowel syndrome)   . Obesity   . Sleep apnea     Past Surgical History:  Procedure Laterality Date  . ABDOMINAL HYSTERECTOMY  2003  . ACHILLES TENDON SURGERY Left   . CESAREAN SECTION    . CHOLECYSTECTOMY N/A 01/03/2020   Procedure: LAPAROSCOPIC CHOLECYSTECTOMY;  Surgeon: Virl Cagey, MD;  Location: [facility];  Service: General;  Laterality: N/A;  . KNEE SURGERY Left    arthroscopic  . NECK SURGERY  2000   cervical fusion 6/7  . REPLACEMENT TOTAL KNEE Left 2019   [facility]  . TONSILECTOMY, ADENOIDECTOMY, BILATERAL MYRINGOTOMY AND TUBES    . TONSILLECTOMY    . TUBAL LIGATION  1996    Social History   Socioeconomic History  . Marital status: Married    Spouse name: Not on file  . Number of children: 3  . Years of education: some college  . Highest education level: Not on file  Occupational History    Comment: [employer], loan processor  Tobacco Use  . Smoking status: Never Smoker  . Smokeless tobacco: Never Used  Vaping Use  . Vaping Use: Never used  Substance and Sexual Activity  . Alcohol use: Yes    Alcohol/week: 1.0 standard drink    Types: 1 Glasses of wine per week    Comment: 2 a month   . Drug use: No  . Sexual activity: Yes     Birth control/protection: Surgical  Other Topics Concern  . Not on file  Social History Narrative   Lives with spouse   2 caffeine drinks daily    Social Determinants of Health   Financial Resource Strain:   . Difficulty of Paying Living Expenses: Not on file  Food Insecurity:   . Worried About Charity fundraiser in the Last Year: Not on file  . Ran Out of Food in the Last Year: Not on file  Transportation Needs:   . Lack of Transportation (Medical): Not on file  . Lack of Transportation (Non-Medical): Not on file  Physical Activity:   . Days of Exercise per Week: Not on file  . Minutes of Exercise per Session: Not on file  Stress:   . Feeling of Stress : Not on file  Social Connections:   . Frequency of Communication with Friends and Family: Not  on file  . Frequency of Social Gatherings with Friends and Family: Not on file  . Attends Religious Services: Not on file  . Active Member of Clubs or Organizations: Not on file  . Attends Archivist Meetings: Not on file  . Marital Status: Not on file    Family History  Problem Relation Age of Onset  . Leukemia Mother   . Diabetes Mother   . Breast cancer Mother   . Hypertension Mother   . Stroke Mother   . Esophageal cancer Brother   . Diabetes Maternal Aunt   . Diabetes Maternal Uncle   . Diabetes Maternal Grandfather   . Heart attack Father   . Heart failure Father   . Colon cancer Neg Hx     Outpatient Encounter Medications as of 07/01/2020  Medication Sig  . acetaminophen (TYLENOL) 500 MG tablet Take 1,000 mg by mouth 2 (two) times daily as needed for headache.  . Blood Glucose Monitoring Suppl (ACCU-CHEK GUIDE) w/Device KIT 1 Piece by Does not apply route as directed.  . calcium carbonate (TUMS - DOSED IN MG ELEMENTAL CALCIUM) 500 MG chewable tablet Chew 1 tablet by mouth daily as needed for indigestion or heartburn.   . dicyclomine (BENTYL) 10 MG capsule Take 10 mg by mouth 2 (two) times daily.   [name]  glipiZIDE (GLUCOTROL) 5 MG tablet Take 5 mg by mouth 2 (two) times daily.  [name] glucose blood (ACCU-CHEK GUIDE) test strip Use as instructed  . ibuprofen (ADVIL,MOTRIN) 200 MG tablet Take 600 mg by mouth 2 (two) times daily as needed for moderate pain.  [name] losartan-hydrochlorothiazide (HYZAAR) 100-25 MG tablet Take 1 tablet by mouth daily.   . polyethylene glycol (MIRALAX) 17 g packet Take 17 g by mouth daily as needed for moderate constipation.  . rosuvastatin (CRESTOR) 10 MG tablet Take 1 tablet (10 mg total) by mouth daily.  [name] tolterodine (DETROL) 2 MG tablet Take 2 mg by mouth 2 (two) times daily.  . [DISCONTINUED] docusate sodium (COLACE) 100 MG capsule Take 1 capsule (100 mg total) by mouth 2 (two) times daily. (Patient taking differently: Take 100 mg by mouth daily. )  . [DISCONTINUED] ondansetron (ZOFRAN ODT) 4 MG disintegrating tablet Take 1 tablet (4 mg total) by mouth every 8 (eight) hours as needed for nausea or vomiting.  . [DISCONTINUED] potassium chloride 20 MEQ TBCR Take 20 mEq by mouth 2 (two) times daily for 3 days. (Patient not taking: Reported on 01/01/2020)   No facility-administered encounter medications on file as of 07/01/2020.    ALLERGIES: Allergies  Allergen Reactions  . Hydromorphone Hcl Nausea And Vomiting  . Sulfa Antibiotics Nausea And Vomiting  . Cephalexin Hives  . Ciprocin-Fluocin-Procin [Fluocinolone Acetonide] Hives  . Bee Venom Swelling    REACTION: Severe swelling  . Levofloxacin Hives  . Celebrex [Celecoxib] Rash    itching  . Penicillins Rash    Did it involve swelling of the face/tongue/throat, SOB, or low BP? No Did it involve sudden or severe rash/hives, skin peeling, or any reaction on the inside of your mouth or nose? Yes Did you need to seek medical attention at a hospital or doctor's office? Unknown When did it last happen? childhood If all above answers are "NO", may proceed with cephalosporin use.     VACCINATION STATUS:  There is  no immunization history on file for this patient.  Diabetes She presents for her initial diabetic visit. She has type 2 diabetes mellitus. Her disease course  has been worsening. There are no hypoglycemic associated symptoms. Pertinent negatives for hypoglycemia include no confusion, headaches, pallor or seizures. Associated symptoms include fatigue, polydipsia and polyuria. Pertinent negatives for diabetes include no chest pain and no polyphagia. There are no hypoglycemic complications. Symptoms are worsening. There are no diabetic complications. Risk factors for coronary artery disease include dyslipidemia, diabetes mellitus, family history, hypertension, obesity, post-menopausal and sedentary lifestyle. Current diabetic treatments: She is currently on glipizide 5 mg p.o. twice daily, reports intolerance to Metformin. Her weight is fluctuating minimally. She is following a generally unhealthy diet. When asked about meal planning, she reported none. She has not had a previous visit with a dietitian. She participates in exercise intermittently. (She did not bring any logs nor meter to review.  Her recent A1c was 11.4%.) An ACE inhibitor/angiotensin II receptor blocker is being taken. Eye exam is current.  Hypertension This is a chronic problem. The current episode started more than 1 year ago. The problem is controlled. Pertinent negatives include no chest pain, headaches, palpitations or shortness of breath. Risk factors for coronary artery disease include diabetes mellitus, dyslipidemia, family history, obesity, post-menopausal state and sedentary lifestyle. Past treatments include angiotensin blockers.  Hyperlipidemia This is a chronic problem. The current episode started more than 1 year ago. The problem is uncontrolled. Pertinent negatives include no chest pain, myalgias or shortness of breath. She is currently on no antihyperlipidemic treatment. Risk factors for coronary artery disease include diabetes  mellitus, dyslipidemia, family history, hypertension, obesity, a sedentary lifestyle and post-menopausal.     Review of Systems  Constitutional: Positive for fatigue. Negative for chills, fever and unexpected weight change.  HENT: Negative for trouble swallowing and voice change.   Eyes: Negative for visual disturbance.  Respiratory: Negative for cough, shortness of breath and wheezing.   Cardiovascular: Negative for chest pain, palpitations and leg swelling.  Gastrointestinal: Negative for diarrhea, nausea and vomiting.  Endocrine: Positive for polydipsia and polyuria. Negative for cold intolerance, heat intolerance and polyphagia.  Musculoskeletal: Negative for arthralgias and myalgias.  Skin: Negative for color change, pallor, rash and wound.  Neurological: Negative for seizures and headaches.  Psychiatric/Behavioral: Negative for confusion and suicidal ideas.    Objective:    Vitals with BMI 07/01/2020 05/20/2020 05/20/2020  Height 5' 4"  - 5' 4"   Weight 219 lbs 3 oz - 222 lbs  BMI 21.97 - 58.83  Systolic 254 982 641  Diastolic 70 73 79  Pulse 68 84 85    BP 100/70   Pulse 68   Ht 5' 4"  (1.626 m)   Wt 219 lb 3.2 oz (99.4 kg)   BMI 37.63 kg/m   Wt Readings from Last 3 Encounters:  07/01/20 219 lb 3.2 oz (99.4 kg)  05/20/20 (!) 222 lb (100.7 kg)  02/11/20 219 lb 3.2 oz (99.4 kg)     Physical Exam Constitutional:      Appearance: She is well-developed.  HENT:     Head: Normocephalic and atraumatic.  Neck:     Thyroid: No thyromegaly.     Trachea: No tracheal deviation.  Cardiovascular:     Rate and Rhythm: Normal rate and regular rhythm.  Pulmonary:     Effort: Pulmonary effort is normal.  Abdominal:     Tenderness: There is no abdominal tenderness. There is no guarding.  Musculoskeletal:        General: Normal range of motion.     Cervical back: Normal range of motion and neck supple.  Skin:  General: Skin is warm and dry.     Coloration: Skin is not pale.      Findings: No erythema or rash.  Neurological:     Mental Status: She is alert and oriented to person, place, and time.     Cranial Nerves: No cranial nerve deficit.     Coordination: Coordination normal.     Deep Tendon Reflexes: Reflexes are normal and symmetric.  Psychiatric:        Judgment: Judgment normal.       CMP ( most recent) CMP     Component Value Date/Time   NA 135 05/20/2020 1521   K 3.4 (L) 05/20/2020 1521   CL 99 05/20/2020 1521   CO2 26 05/20/2020 1521   GLUCOSE 300 (H) 05/20/2020 1521   BUN 9 05/20/2020 1521   CREATININE 0.80 05/20/2020 1521   CALCIUM 9.3 05/20/2020 1521   PROT 7.5 01/01/2020 2005   ALBUMIN 3.9 01/01/2020 2005   AST 17 01/01/2020 2005   ALT 17 01/01/2020 2005   ALKPHOS 69 01/01/2020 2005   BILITOT 0.5 01/01/2020 2005   GFRNONAA >60 05/20/2020 1521   GFRAA >60 05/20/2020 1521     Diabetic Labs (most recent): Lab Results  Component Value Date   HGBA1C 11.4 05/20/2020   HGBA1C 7.6 (H) 01/01/2020   HGBA1C 6.8 (H) 01/27/2014     Lipid Panel ( most recent) Lipid Panel     Component Value Date/Time   CHOL 246 (H) 01/27/2014 0556   TRIG 119 01/27/2014 0556   HDL 43 01/27/2014 0556   CHOLHDL 5.7 01/27/2014 0556   VLDL 24 01/27/2014 0556   LDLCALC 179 (H) 01/27/2014 0556      Lab Results  Component Value Date   TSH 1.738 11/27/2010      Assessment & Plan:   1. Uncontrolled type 2 diabetes mellitus with hyperglycemia (HCC) - [place] has currently uncontrolled symptomatic type 2 DM since  54 years of age,  with most recent A1c of 11.4 %. Recent labs reviewed. - I had a long discussion with her about the progressive nature of diabetes and the pathology behind its complications. -She did not report gross complications from her diabetes, however she remains at a high risk for more acute and chronic complications which include CAD, CVA, CKD, retinopathy, and neuropathy. These are all discussed in detail with her.  -  I have counseled her on diet  and weight management  by adopting a carbohydrate restricted/protein rich diet. Patient is encouraged to switch to  unprocessed or minimally processed     complex starch and increased protein intake (animal or plant source), fruits, and vegetables. -  she is advised to stick to a routine mealtimes to eat 3 meals  a day and avoid unnecessary snacks ( to snack only to correct hypoglycemia).   - she admits that there is a room for improvement in her food and drink choices. - Suggestion is made for her to avoid simple carbohydrates  from her diet including Cakes, Sweet Desserts, Ice Cream, Soda (diet and regular), Sweet Tea, Candies, Chips, Cookies, Store Bought Juices, Alcohol in Excess of  1-2 drinks a day, Artificial Sweeteners,  Coffee Creamer, and "Sugar-free" Products. This will help patient to have more stable blood glucose profile and potentially avoid unintended weight gain.  - she will be scheduled with Jearld Fenton, RDN, CDE for diabetes education.  - I have approached her with the following individualized plan to manage  her diabetes  and patient agrees:   - she will likely need insulin treatment in order for her to achieve and maintain control of diabetes to target. -In preparation, she is approached to start  strict monitoring of glucose 4 times a day-before meals and at bedtime and return in 1 week with her meter and logs for reevaluation.  A new meter and testing supplies were prescribed for her today. - she is encouraged to call clinic for blood glucose levels less than 70 or above 300 mg /dl. - she is advised to continue glipizide 5 mg p.o. twice daily with breakfast and supper therapeutically suitable for patient . -She did not tolerate Metformin.  She did not tolerate incretin therapy. She will be considered for at least basal insulin depending on her glycemic response next visit. - Specific targets for  A1c;  LDL, HDL,  and Triglycerides were discussed  with the patient.  2) Blood Pressure /Hypertension:  her blood pressure is  controlled to target.   she is advised to continue her current medications including losartan/HCTZ 100/25 mg p.o. daily with breakfast . 3) Lipids/Hyperlipidemia:   Review of her recent lipid panel showed uncontrolled  LDL at 179 .  she  is not on a statin medications.  She is approached for intervention with Crestor and she agrees.  I discussed initiated Crestor 10 mg p.o. nightly.   Side effects and precautions discussed with her.  4)  Weight/Diet:  Body mass index is 37.63 kg/m.  -   clearly complicating her diabetes care.   she is  a candidate for weight loss. I discussed with her the fact that loss of 5 - 10% of her  current body weight will have the most impact on her diabetes management.  Exercise, and detailed carbohydrates information provided  -  detailed on discharge instructions.  5) Chronic Care/Health Maintenance:  -she  ison ACEI/ARB and Statin medications and  is encouraged to initiate and continue to follow up with Ophthalmology, Dentist,  Podiatrist at least yearly or according to recommendations, and advised to  stay away from smoking. I have recommended yearly flu vaccine and pneumonia vaccine at least every 5 years; moderate intensity exercise for up to 150 minutes weekly; and  sleep for at least 7 hours a day.  - she is  advised to maintain close follow up with Sharilyn Sites, MD for primary care needs, as well as her other providers for optimal and coordinated care.   - Time spent in this patient care: 60 min, of which > 50% was spent in  counseling  her about her currently uncontrolled type 2 diabetes, hyperlipidemia, hypertension and the rest reviewing her blood glucose logs , discussing her hypoglycemia and hyperglycemia episodes, reviewing her current and  previous labs / studies  ( including abstraction from other facilities) and medications  doses and developing a  long term treatment plan based on  the latest standards of care/ guidelines; and documenting her care.    Please refer to Patient Instructions for Blood Glucose Monitoring and Insulin/Medications Dosing Guide"  in media tab for additional information. Please  also refer to " Patient Self Inventory" in the Media  tab for reviewed elements of pertinent patient history.  Victoria Williams participated in the discussions, expressed understanding, and voiced agreement with the above plans.  All questions were answered to her satisfaction. she is encouraged to contact clinic should she have any questions or concerns prior to her return visit.   Follow up plan: -  Return in about 1 week (around 07/08/2020) for F/U with Meter and Logs Only - no Labs.  Glade Lloyd, MD [facility] [address] [place], [place] [address] Phone: [phone]  Fax: [phone]    07/01/2020, 10:00 AM  This note was partially dictated with voice recognition software. Similar sounding words can be transcribed inadequately or may not  be corrected upon review.

## 2020-07-01 NOTE — Patient Instructions (Signed)

## 2020-07-08 ENCOUNTER — Encounter: Payer: Self-pay | Admitting: "Endocrinology

## 2020-07-08 ENCOUNTER — Ambulatory Visit (INDEPENDENT_AMBULATORY_CARE_PROVIDER_SITE_OTHER): Payer: BC Managed Care – PPO | Admitting: "Endocrinology

## 2020-07-08 ENCOUNTER — Other Ambulatory Visit: Payer: Self-pay

## 2020-07-08 VITALS — BP 98/70 | HR 80 | Ht 64.0 in | Wt 218.4 lb

## 2020-07-08 DIAGNOSIS — E782 Mixed hyperlipidemia: Secondary | ICD-10-CM | POA: Diagnosis not present

## 2020-07-08 DIAGNOSIS — E1165 Type 2 diabetes mellitus with hyperglycemia: Secondary | ICD-10-CM | POA: Diagnosis not present

## 2020-07-08 MED ORDER — TRESIBA FLEXTOUCH 100 UNIT/ML ~~LOC~~ SOPN: 20.0000 [IU] | PEN_INJECTOR | Freq: Every day | SUBCUTANEOUS | 1 refills | Status: DC

## 2020-07-08 MED ORDER — BD PEN NEEDLE SHORT U/F 31G X 8 MM MISC: 1.0000 | 3 refills | Status: DC

## 2020-07-08 NOTE — Patient Instructions (Signed)

## 2020-07-08 NOTE — Progress Notes (Signed)
07/08/2020, 4:02 PM   Endocrinology follow-up note  Subjective:    Patient ID: Victoria Williams, female    DOB: 04-02-1966.  Verley BURGUNDY MATUSZAK is being seen in follow-up after she was seen in consultation for management of currently uncontrolled symptomatic diabetes requested by  Sharilyn Sites, MD.   Past Medical History:  Diagnosis Date  . Arthritis   . Bell's palsy    approx 2015  . Chronic kidney disease   . Diabetes mellitus    only when on steriods  . Esophagitis 2012   EGD  . Family history of adverse reaction to anesthesia    PONV and hallucinations-MOM  . Henoch-Schonlein purpura ([place])   . History of kidney stones   . History of UTI   . Hypertension   . IBS (irritable bowel syndrome)   . Obesity   . Sleep apnea     Past Surgical History:  Procedure Laterality Date  . ABDOMINAL HYSTERECTOMY  2003  . ACHILLES TENDON SURGERY Left   . CESAREAN SECTION    . CHOLECYSTECTOMY N/A 01/03/2020   Procedure: LAPAROSCOPIC CHOLECYSTECTOMY;  Surgeon: Virl Cagey, MD;  Location: [facility];  Service: General;  Laterality: N/A;  . KNEE SURGERY Left    arthroscopic  . NECK SURGERY  2000   cervical fusion 6/7  . REPLACEMENT TOTAL KNEE Left 2019   [facility]  . TONSILECTOMY, ADENOIDECTOMY, BILATERAL MYRINGOTOMY AND TUBES    . TONSILLECTOMY    . TUBAL LIGATION  1996    Social History   Socioeconomic History  . Marital status: Married    Spouse name: Not on file  . Number of children: 3  . Years of education: some college  . Highest education level: Not on file  Occupational History    Comment: [employer], loan processor  Tobacco Use  . Smoking status: Never Smoker  . Smokeless tobacco: Never Used  Vaping Use  . Vaping Use: Never used  Substance and Sexual Activity  . Alcohol use: Yes    Alcohol/week: 1.0 standard drink    Types: 1 Glasses of wine per week    Comment: 2 a month   . Drug  use: No  . Sexual activity: Yes    Birth control/protection: Surgical  Other Topics Concern  . Not on file  Social History Narrative   Lives with spouse   2 caffeine drinks daily    Social Determinants of Health   Financial Resource Strain:   . Difficulty of Paying Living Expenses: Not on file  Food Insecurity:   . Worried About Charity fundraiser in the Last Year: Not on file  . Ran Out of Food in the Last Year: Not on file  Transportation Needs:   . Lack of Transportation (Medical): Not on file  . Lack of Transportation (Non-Medical): Not on file  Physical Activity:   . Days of Exercise per Week: Not on file  . Minutes of Exercise per Session: Not on file  Stress:   . Feeling of Stress : Not on file  Social Connections:   . Frequency of Communication  with Friends and Family: Not on file  . Frequency of Social Gatherings with Friends and Family: Not on file  . Attends Religious Services: Not on file  . Active Member of Clubs or Organizations: Not on file  . Attends Archivist Meetings: Not on file  . Marital Status: Not on file    Family History  Problem Relation Age of Onset  . Leukemia Mother   . Diabetes Mother   . Breast cancer Mother   . Hypertension Mother   . Stroke Mother   . Esophageal cancer Brother   . Diabetes Maternal Aunt   . Diabetes Maternal Uncle   . Diabetes Maternal Grandfather   . Heart attack Father   . Heart failure Father   . Colon cancer Neg Hx     Outpatient Encounter Medications as of 07/08/2020  Medication Sig  . acetaminophen (TYLENOL) 500 MG tablet Take 1,000 mg by mouth 2 (two) times daily as needed for headache.  . Blood Glucose Monitoring Suppl (ACCU-CHEK GUIDE) w/Device KIT 1 Piece by Does not apply route as directed.  . calcium carbonate (TUMS - DOSED IN MG ELEMENTAL CALCIUM) 500 MG chewable tablet Chew 1 tablet by mouth daily as needed for indigestion or heartburn.   . dicyclomine (BENTYL) 10 MG capsule Take 10 mg by  mouth 2 (two) times daily.   [name] glipiZIDE (GLUCOTROL) 5 MG tablet Take 5 mg by mouth 2 (two) times daily.  [name] glucose blood (ACCU-CHEK GUIDE) test strip Use as instructed  . ibuprofen (ADVIL,MOTRIN) 200 MG tablet Take 600 mg by mouth 2 (two) times daily as needed for moderate pain.  [name] insulin degludec (TRESIBA FLEXTOUCH) 100 UNIT/ML FlexTouch Pen Inject 20 Units into the skin daily.  . Insulin Pen Needle (B-D ULTRAFINE III SHORT PEN) 31G X 8 MM MISC 1 each by Does not apply route as directed.  [name] losartan-hydrochlorothiazide (HYZAAR) 100-25 MG tablet Take 1 tablet by mouth daily.   . polyethylene glycol (MIRALAX) 17 g packet Take 17 g by mouth daily as needed for moderate constipation.  . rosuvastatin (CRESTOR) 10 MG tablet Take 1 tablet (10 mg total) by mouth daily.  [name] tolterodine (DETROL) 2 MG tablet Take 2 mg by mouth 2 (two) times daily.   No facility-administered encounter medications on file as of 07/08/2020.    ALLERGIES: Allergies  Allergen Reactions  . Hydromorphone Hcl Nausea And Vomiting  . Sulfa Antibiotics Nausea And Vomiting  . Cephalexin Hives  . Ciprocin-Fluocin-Procin [Fluocinolone Acetonide] Hives  . Bee Venom Swelling    REACTION: Severe swelling  . Levofloxacin Hives  . Celebrex [Celecoxib] Rash    itching  . Penicillins Rash    Did it involve swelling of the face/tongue/throat, SOB, or low BP? No Did it involve sudden or severe rash/hives, skin peeling, or any reaction on the inside of your mouth or nose? Yes Did you need to seek medical attention at a hospital or doctor's office? Unknown When did it last happen? childhood If all above answers are "NO", may proceed with cephalosporin use.     VACCINATION STATUS:  There is no immunization history on file for this patient.  Diabetes She presents for her follow-up diabetic visit. She has type 2 diabetes mellitus. Her disease course has been worsening. There are no hypoglycemic associated symptoms. Pertinent  negatives for hypoglycemia include no confusion, headaches, pallor or seizures. Associated symptoms include fatigue, polydipsia and polyuria. Pertinent negatives for diabetes include no chest pain and no polyphagia. There  are no hypoglycemic complications. Symptoms are improving. There are no diabetic complications. Risk factors for coronary artery disease include dyslipidemia, diabetes mellitus, family history, hypertension, obesity, post-menopausal and sedentary lifestyle. Current diabetic treatments: She is currently on glipizide 5 mg p.o. twice daily, reports intolerance to Metformin. Her weight is decreasing steadily. She is following a generally unhealthy diet. When asked about meal planning, she reported none. She has not had a previous visit with a dietitian. She participates in exercise intermittently. Her home blood glucose trend is decreasing steadily. (She presents with slightly improving glycemic profile, still significant fasting and postprandial hyperglycemia.  Her recent A1c was 11.4%.  She has no hypoglycemia.   ) An ACE inhibitor/angiotensin II receptor blocker is being taken. Eye exam is current.  Hypertension This is a chronic problem. The current episode started more than 1 year ago. The problem is controlled. Pertinent negatives include no chest pain, headaches, palpitations or shortness of breath. Risk factors for coronary artery disease include diabetes mellitus, dyslipidemia, family history, obesity, post-menopausal state and sedentary lifestyle. Past treatments include angiotensin blockers.  Hyperlipidemia This is a chronic problem. The current episode started more than 1 year ago. The problem is uncontrolled. Pertinent negatives include no chest pain, myalgias or shortness of breath. She is currently on no antihyperlipidemic treatment. Risk factors for coronary artery disease include diabetes mellitus, dyslipidemia, family history, hypertension, obesity, a sedentary lifestyle and  post-menopausal.     Review of Systems  Constitutional: Positive for fatigue. Negative for chills, fever and unexpected weight change.  HENT: Negative for trouble swallowing and voice change.   Eyes: Negative for visual disturbance.  Respiratory: Negative for cough, shortness of breath and wheezing.   Cardiovascular: Negative for chest pain, palpitations and leg swelling.  Gastrointestinal: Negative for diarrhea, nausea and vomiting.  Endocrine: Positive for polydipsia and polyuria. Negative for cold intolerance, heat intolerance and polyphagia.  Musculoskeletal: Negative for arthralgias and myalgias.  Skin: Negative for color change, pallor, rash and wound.  Neurological: Negative for seizures and headaches.  Psychiatric/Behavioral: Negative for confusion and suicidal ideas.    Objective:    Vitals with BMI 07/08/2020 07/01/2020 05/20/2020  Height 5' 4"  5' 4"  -  Weight 218 lbs 6 oz 219 lbs 3 oz -  BMI 23.55 73.22 -  Systolic 98 025 427  Diastolic 70 70 73  Pulse 80 68 84    BP 98/70   Pulse 80   Ht 5' 4"  (1.626 m)   Wt 218 lb 6.4 oz (99.1 kg)   BMI 37.49 kg/m   Wt Readings from Last 3 Encounters:  07/08/20 218 lb 6.4 oz (99.1 kg)  07/01/20 219 lb 3.2 oz (99.4 kg)  05/20/20 (!) 222 lb (100.7 kg)     Physical Exam Constitutional:      Appearance: She is well-developed.  HENT:     Head: Normocephalic and atraumatic.  Neck:     Thyroid: No thyromegaly.     Trachea: No tracheal deviation.  Cardiovascular:     Rate and Rhythm: Normal rate and regular rhythm.  Pulmonary:     Effort: Pulmonary effort is normal.  Abdominal:     Tenderness: There is no abdominal tenderness. There is no guarding.  Musculoskeletal:        General: Normal range of motion.     Cervical back: Normal range of motion and neck supple.  Skin:    General: Skin is warm and dry.     Coloration: Skin is not pale.  Findings: No erythema or rash.  Neurological:     Mental Status: She is alert  and oriented to person, place, and time.     Cranial Nerves: No cranial nerve deficit.     Coordination: Coordination normal.     Deep Tendon Reflexes: Reflexes are normal and symmetric.  Psychiatric:        Judgment: Judgment normal.       CMP ( most recent) CMP     Component Value Date/Time   NA 135 05/20/2020 1521   K 3.4 (L) 05/20/2020 1521   CL 99 05/20/2020 1521   CO2 26 05/20/2020 1521   GLUCOSE 300 (H) 05/20/2020 1521   BUN 9 05/20/2020 1521   CREATININE 0.80 05/20/2020 1521   CALCIUM 9.3 05/20/2020 1521   PROT 7.5 01/01/2020 2005   ALBUMIN 3.9 01/01/2020 2005   AST 17 01/01/2020 2005   ALT 17 01/01/2020 2005   ALKPHOS 69 01/01/2020 2005   BILITOT 0.5 01/01/2020 2005   GFRNONAA >60 05/20/2020 1521   GFRAA >60 05/20/2020 1521     Diabetic Labs (most recent): Lab Results  Component Value Date   HGBA1C 11.4 05/20/2020   HGBA1C 7.6 (H) 01/01/2020   HGBA1C 6.8 (H) 01/27/2014     Lipid Panel ( most recent) Lipid Panel     Component Value Date/Time   CHOL 246 (H) 01/27/2014 0556   TRIG 119 01/27/2014 0556   HDL 43 01/27/2014 0556   CHOLHDL 5.7 01/27/2014 0556   VLDL 24 01/27/2014 0556   LDLCALC 179 (H) 01/27/2014 0556      Lab Results  Component Value Date   TSH 1.738 11/27/2010      Assessment & Plan:   1. Uncontrolled type 2 diabetes mellitus with hyperglycemia (HCC) - [place] has currently uncontrolled symptomatic type 2 DM since  54 years of age.  She presents with slightly improving glycemic profile, still significant fasting and postprandial hyperglycemia.  Her recent A1c was 11.4%.  She has no hypoglycemia.     Recent labs reviewed. - I had a long discussion with her about the progressive nature of diabetes and the pathology behind its complications. -She did not report gross complications from her diabetes, however she remains at a high risk for more acute and chronic complications which include CAD, CVA, CKD, retinopathy, and  neuropathy. These are all discussed in detail with her.  - I have counseled her on diet  and weight management  by adopting a carbohydrate restricted/protein rich diet. Patient is encouraged to switch to  unprocessed or minimally processed     complex starch and increased protein intake (animal or plant source), fruits, and vegetables. -  she is advised to stick to a routine mealtimes to eat 3 meals  a day and avoid unnecessary snacks ( to snack only to correct hypoglycemia).   - she  admits there is a room for improvement in her diet and drink choices. -  Suggestion is made for her to avoid simple carbohydrates  from her diet including Cakes, Sweet Desserts / Pastries, Ice Cream, Soda (diet and regular), Sweet Tea, Candies, Chips, Cookies, Sweet Pastries,  Store Bought Juices, Alcohol in Excess of  1-2 drinks a day, Artificial Sweeteners, Coffee Creamer, and "Sugar-free" Products. This will help patient to have stable blood glucose profile and potentially avoid unintended weight gain.   - she will be scheduled with Jearld Fenton, RDN, CDE for diabetes education.  - I have approached her with the following  individualized plan to manage  her diabetes and patient agrees:   -In light of her presentation with significantly above target glycemic profile, and her intolerance to Metformin and incretin therapy, she is approached for introduction of basal insulin and she accepts. -I discussed and initiated Tresiba 20 units nightly with a sample pen, associated with continued monitoring of blood glucose 4 times a day-  -before meals and at bedtime .  - she is encouraged to call clinic for blood glucose levels less than 70 or above 200 mg /dl. - she is advised to continue glipizide 5 mg p.o. twice daily with breakfast and supper therapeutically suitable for patient . -She reports intolerance to Metformin and incretin therapy.    - Specific targets for  A1c;  LDL, HDL,  and Triglycerides were discussed with  the patient.  2) Blood Pressure /Hypertension: Her blood pressure is controlled to target.  she is advised to continue her current medications including losartan/HCTZ 100/25 mg p.o. daily with breakfast . 3) Lipids/Hyperlipidemia:   Review of her recent lipid panel showed uncontrolled  LDL at 179 .  she  is not on a statin medications.  She was recently initiated on Crestor, continues to tolerate.  She is advised to continue Crestor 10 mg p.o. nightly.    Side effects and precautions discussed with her.  4)  Weight/Diet:  Body mass index is 37.49 kg/m.  -   clearly complicating her diabetes care.   she is  a candidate for weight loss. I discussed with her the fact that loss of 5 - 10% of her  current body weight will have the most impact on her diabetes management.  Exercise, and detailed carbohydrates information provided  -  detailed on discharge instructions.  5) Chronic Care/Health Maintenance:  -she  ison ACEI/ARB and Statin medications and  is encouraged to initiate and continue to follow up with Ophthalmology, Dentist,  Podiatrist at least yearly or according to recommendations, and advised to  stay away from smoking. I have recommended yearly flu vaccine and pneumonia vaccine at least every 5 years; moderate intensity exercise for up to 150 minutes weekly; and  sleep for at least 7 hours a day.  - she is  advised to maintain close follow up with Sharilyn Sites, MD for primary care needs, as well as her other providers for optimal and coordinated care.   - Time spent on this patient care encounter:  35 min, of which > 50% was spent in  counseling and the rest reviewing her blood glucose logs , discussing her hypoglycemia and hyperglycemia episodes, reviewing her current and  previous labs / studies  ( including abstraction from other facilities) and medications  doses and developing a  long term treatment plan and documenting her care.   Please refer to Patient Instructions for Blood Glucose  Monitoring and Insulin/Medications Dosing Guide"  in media tab for additional information. Please  also refer to " Patient Self Inventory" in the Media  tab for reviewed elements of pertinent patient history.  Luna Fuse Malinoski participated in the discussions, expressed understanding, and voiced agreement with the above plans.  All questions were answered to her satisfaction. she is encouraged to contact clinic should she have any questions or concerns prior to her return visit.   Follow up plan: - Return in about 7 weeks (around 08/26/2020) for Bring Meter and Logs- A1c in Office.  Glade Lloyd, MD [facility] [address] [place],  [place] [address] Phone: [phone]  Fax: [phone]    07/08/2020, 4:02 PM  This note was partially dictated with voice recognition software. Similar sounding words can be transcribed inadequately or may not  be corrected upon review.

## 2020-07-13 ENCOUNTER — Other Ambulatory Visit: Payer: Self-pay

## 2020-07-13 ENCOUNTER — Telehealth: Payer: Self-pay | Admitting: "Endocrinology

## 2020-07-13 DIAGNOSIS — E1165 Type 2 diabetes mellitus with hyperglycemia: Secondary | ICD-10-CM

## 2020-07-13 MED ORDER — GLIPIZIDE 5 MG PO TABS: 5.0000 mg | ORAL_TABLET | Freq: Two times a day (BID) | ORAL | 1 refills | Status: DC

## 2020-07-13 NOTE — Telephone Encounter (Signed)
Sent in

## 2020-07-13 NOTE — Telephone Encounter (Signed)
Patient called and is requesting a refill on glipiZIDE (GLUCOTROL) 5 MG tablet   Dr. Phillips Odor previously has been prescribing this. Patient states he is wanting this office to fill since the patient is being seen with [place] now.   [facility] [phone] - [place], [place] - [address] AT [facility] [address] Phone:  [phone]  Fax:  [phone]

## 2020-07-16 ENCOUNTER — Other Ambulatory Visit: Payer: Self-pay | Admitting: "Endocrinology

## 2020-07-16 ENCOUNTER — Telehealth: Payer: Self-pay | Admitting: "Endocrinology

## 2020-07-16 DIAGNOSIS — Z0189 Encounter for other specified special examinations: Secondary | ICD-10-CM | POA: Diagnosis not present

## 2020-07-16 DIAGNOSIS — E1165 Type 2 diabetes mellitus with hyperglycemia: Secondary | ICD-10-CM

## 2020-07-16 NOTE — Telephone Encounter (Signed)
Pt is calling and requesting a referral to [facility].  Also states her notes from Dr. Fransico Him it states continue taking glipizide at breakfast, and patient states she has been taking it at breakfast and dinner and would like to confirm if she still needs to be taking at dinner.   9/21 - AM 197, lunch 209, before bed 309  9/22 - AM 176, lunch 165, before bed 216  9/23- AM 175, lunch 212

## 2020-07-16 NOTE — Telephone Encounter (Signed)
Discussed with pt, understanding voiced. 

## 2020-07-16 NOTE — Telephone Encounter (Signed)
Will refer her to [facility]. Advise to increase tresiba to 30 units qhs, yes for glipizide 5mg  twice daily. Keep monitoring 4 times daily, keep appt.

## 2020-07-16 NOTE — Addendum Note (Signed)
Addended by: Derrell Lolling on: 07/16/2020 02:38 PM   Modules accepted: Orders

## 2020-07-21 DIAGNOSIS — Z713 Dietary counseling and surveillance: Secondary | ICD-10-CM | POA: Diagnosis not present

## 2020-07-21 DIAGNOSIS — I10 Essential (primary) hypertension: Secondary | ICD-10-CM | POA: Diagnosis not present

## 2020-07-21 DIAGNOSIS — Z043 Encounter for examination and observation following other accident: Secondary | ICD-10-CM | POA: Diagnosis not present

## 2020-07-24 ENCOUNTER — Encounter: Payer: Self-pay | Admitting: Cardiology

## 2020-07-24 ENCOUNTER — Ambulatory Visit (INDEPENDENT_AMBULATORY_CARE_PROVIDER_SITE_OTHER): Payer: BC Managed Care – PPO | Admitting: Cardiology

## 2020-07-24 VITALS — BP 114/64 | HR 85 | Ht 64.0 in | Wt 219.8 lb

## 2020-07-24 DIAGNOSIS — R0789 Other chest pain: Secondary | ICD-10-CM

## 2020-07-24 NOTE — Patient Instructions (Signed)

## 2020-07-24 NOTE — Progress Notes (Signed)
Clinical Summary Ms. Sylvester is a 54 y.o.female seen today as a new patient for the following medical problems,.  1. Chest pain - started over last few months. Can occur at rest or with exertion. Typically left chest pain, aching pain or sharp pain. 8/10 in severity. No SOB or palpitations. Not positional. Pain lasts up to 1 hour. Occurs 4 days week. Increase in frequency and severity over the last few weeks. No relation to food.  - notes some DOE at 1 block, though also limited after achilles surgery in 2013   - dull pain, could sharp burst. Typically mid to left chest, right shoulder. 9/10 in severity. Some nausea. Not positional. No SOB or DOE. Pain would last up 2-3 hours. Would try tums, unsure if it helped. Would occur at rest or mild activity. No relation to food or eating. Was increasing in frequency - last episode 1 month ago - similar to prior pains but more intense.     CAD risk factors: HTN, DM2 on steroids mainly, father MI 14. Several paternal uncles with MIs.   Past Medical History:  Diagnosis Date  . Arthritis   . Bell's palsy    approx 2015  . Chronic kidney disease   . Diabetes mellitus    only when on steriods  . Esophagitis 2012   EGD  . Family history of adverse reaction to anesthesia    PONV and hallucinations-MOM  . Henoch-Schonlein purpura ([employer])   . History of kidney stones   . History of UTI   . Hypertension   . IBS (irritable bowel syndrome)   . Obesity   . Sleep apnea      Allergies  Allergen Reactions  . Hydromorphone Hcl Nausea And Vomiting  . Sulfa Antibiotics Nausea And Vomiting  . Cephalexin Hives  . Ciprocin-Fluocin-Procin [Fluocinolone Acetonide] Hives  . Bee Venom Swelling    REACTION: Severe swelling  . Levofloxacin Hives  . Celebrex [Celecoxib] Rash    itching  . Penicillins Rash    Did it involve swelling of the face/tongue/throat, SOB, or low BP? No Did it involve sudden or severe rash/hives, skin peeling, or any  reaction on the inside of your mouth or nose? Yes Did you need to seek medical attention at a hospital or doctor's office? Unknown When did it last happen? childhood If all above answers are "NO", may proceed with cephalosporin use.      Current Outpatient Medications  Medication Sig Dispense Refill  . acetaminophen (TYLENOL) 500 MG tablet Take 1,000 mg by mouth 2 (two) times daily as needed for headache.    . Blood Glucose Monitoring Suppl (ACCU-CHEK GUIDE) w/Device KIT 1 Piece by Does not apply route as directed. 1 kit 0  . calcium carbonate (TUMS - DOSED IN MG ELEMENTAL CALCIUM) 500 MG chewable tablet Chew 1 tablet by mouth daily as needed for indigestion or heartburn.     . dicyclomine (BENTYL) 10 MG capsule Take 10 mg by mouth 2 (two) times daily.   0  . glipiZIDE (GLUCOTROL) 5 MG tablet Take 1 tablet (5 mg total) by mouth 2 (two) times daily. 180 tablet 1  . glucose blood (ACCU-CHEK GUIDE) test strip Use as instructed 150 each 2  . ibuprofen (ADVIL,MOTRIN) 200 MG tablet Take 600 mg by mouth 2 (two) times daily as needed for moderate pain.    [name] insulin degludec (TRESIBA FLEXTOUCH) 100 UNIT/ML FlexTouch Pen Inject 20 Units into the skin daily. 15 mL 1  .  Insulin Pen Needle (B-D ULTRAFINE III SHORT PEN) 31G X 8 MM MISC 1 each by Does not apply route as directed. 100 each 3  . losartan-hydrochlorothiazide (HYZAAR) 100-25 MG tablet Take 1 tablet by mouth daily.     . polyethylene glycol (MIRALAX) 17 g packet Take 17 g by mouth daily as needed for moderate constipation. 14 each 0  . rosuvastatin (CRESTOR) 10 MG tablet Take 1 tablet (10 mg total) by mouth daily. 90 tablet 1  . tolterodine (DETROL) 2 MG tablet Take 2 mg by mouth 2 (two) times daily.     No current facility-administered medications for this visit.     Past Surgical History:  Procedure Laterality Date  . ABDOMINAL HYSTERECTOMY  2003  . ACHILLES TENDON SURGERY Left   . CESAREAN SECTION    . CHOLECYSTECTOMY N/A  01/03/2020   Procedure: LAPAROSCOPIC CHOLECYSTECTOMY;  Surgeon: Virl Cagey, MD;  Location: [facility];  Service: General;  Laterality: N/A;  . KNEE SURGERY Left    arthroscopic  . NECK SURGERY  2000   cervical fusion 6/7  . REPLACEMENT TOTAL KNEE Left 2019   [facility]  . TONSILECTOMY, ADENOIDECTOMY, BILATERAL MYRINGOTOMY AND TUBES    . TONSILLECTOMY    . TUBAL LIGATION  1996     Allergies  Allergen Reactions  . Hydromorphone Hcl Nausea And Vomiting  . Sulfa Antibiotics Nausea And Vomiting  . Cephalexin Hives  . Ciprocin-Fluocin-Procin [Fluocinolone Acetonide] Hives  . Bee Venom Swelling    REACTION: Severe swelling  . Levofloxacin Hives  . Celebrex [Celecoxib] Rash    itching  . Penicillins Rash    Did it involve swelling of the face/tongue/throat, SOB, or low BP? No Did it involve sudden or severe rash/hives, skin peeling, or any reaction on the inside of your mouth or nose? Yes Did you need to seek medical attention at a hospital or doctor's office? Unknown When did it last happen? childhood If all above answers are "NO", may proceed with cephalosporin use.       Family History  Problem Relation Age of Onset  . Leukemia Mother   . Diabetes Mother   . Breast cancer Mother   . Hypertension Mother   . Stroke Mother   . Esophageal cancer Brother   . Diabetes Maternal Aunt   . Diabetes Maternal Uncle   . Diabetes Maternal Grandfather   . Heart attack Father   . Heart failure Father   . Colon cancer Neg Hx      Social History Ms. Delashmit reports that she has never smoked. She has never used smokeless tobacco. Ms. Marone reports current alcohol use of about 1.0 standard drink of alcohol per week.   Review of Systems CONSTITUTIONAL: No weight loss, fever, chills, weakness or fatigue.  HEENT: Eyes: No visual loss, blurred vision, double vision or yellow sclerae.No hearing loss, sneezing, congestion, runny nose or sore throat.  SKIN: No rash or itching.    CARDIOVASCULAR: per hpi  RESPIRATORY: No shortness of breath, cough or sputum.  GASTROINTESTINAL: No anorexia, nausea, vomiting or diarrhea. No abdominal pain or blood.  GENITOURINARY: No burning on urination, no polyuria NEUROLOGICAL: No headache, dizziness, syncope, paralysis, ataxia, numbness or tingling in the extremities. No change in bowel or bladder control.  MUSCULOSKELETAL: No muscle, back pain, joint pain or stiffness.  LYMPHATICS: No enlarged nodes. No history of splenectomy.  PSYCHIATRIC: No history of depression or anxiety.  ENDOCRINOLOGIC: No reports of sweating, cold or heat intolerance. No polyuria or  polydipsia.  [name]   Physical Examination Today's Vitals   07/24/20 1451  BP: 114/64  Pulse: 85  SpO2: 97%  Weight: 219 lb 12.8 oz (99.7 kg)  Height: 5' 4"  (1.626 m)   Body mass index is 37.73 kg/m.  Gen: resting comfortably, no acute distress HEENT: no scleral icterus, pupils equal round and reactive, no palptable cervical adenopathy,  CV: RRR, no m/r/g, no jvd Resp: Clear to auscultation bilaterally GI: abdomen is soft, non-tender, non-distended, normal bowel sounds, no hepatosplenomegaly MSK: extremities are warm, no edema.  Skin: warm, no rash Neuro:  no focal deficits Psych: appropriate affect   Diagnostic Studies  04/2015 echo Study Conclusions   - Left ventricle: The cavity size was normal. There was mild  concentric hypertrophy. Systolic function was normal. The  estimated ejection fraction was in the range of 55% to 60%. While  endocardium was suboptimally visualized, all available views  demonstrated grossly normal wall motion. Doppler parameters are  consistent with abnormal left ventricular relaxation (grade 1  diastolic dysfunction).   04/2015 [facility]  There was no ST segment deviation noted during stress.  [facility] treadmill score of 7 portends a favorable prognosis and a low risk of cardiovascular events.  Normal exercise treadmill  stress test.     Assessment and Plan  1. Chest pain - atypical symptoms that have since resolved - monitor at this time. If change or progression of symptoms would reconsider ischemic testing.          Arnoldo Lenis, M.D.

## 2020-08-26 ENCOUNTER — Encounter: Payer: BC Managed Care – PPO | Attending: "Endocrinology | Admitting: Nutrition

## 2020-08-26 ENCOUNTER — Encounter: Payer: Self-pay | Admitting: "Endocrinology

## 2020-08-26 ENCOUNTER — Encounter: Payer: Self-pay | Admitting: Nutrition

## 2020-08-26 ENCOUNTER — Other Ambulatory Visit: Payer: Self-pay

## 2020-08-26 ENCOUNTER — Ambulatory Visit: Payer: BC Managed Care – PPO | Admitting: "Endocrinology

## 2020-08-26 VITALS — BP 106/64 | HR 80 | Ht 64.0 in | Wt 220.0 lb

## 2020-08-26 VITALS — Ht 64.0 in | Wt 220.0 lb

## 2020-08-26 DIAGNOSIS — E559 Vitamin D deficiency, unspecified: Secondary | ICD-10-CM | POA: Insufficient documentation

## 2020-08-26 DIAGNOSIS — E1165 Type 2 diabetes mellitus with hyperglycemia: Secondary | ICD-10-CM

## 2020-08-26 DIAGNOSIS — E782 Mixed hyperlipidemia: Secondary | ICD-10-CM | POA: Diagnosis not present

## 2020-08-26 DIAGNOSIS — Z6835 Body mass index (BMI) 35.0-35.9, adult: Secondary | ICD-10-CM | POA: Diagnosis not present

## 2020-08-26 DIAGNOSIS — I10 Essential (primary) hypertension: Secondary | ICD-10-CM | POA: Diagnosis not present

## 2020-08-26 LAB — POCT GLYCOSYLATED HEMOGLOBIN (HGB A1C): Hemoglobin A1C: 8 % — AB (ref 4.0–5.6)

## 2020-08-26 MED ORDER — TRESIBA FLEXTOUCH 100 UNIT/ML ~~LOC~~ SOPN: 40.0000 [IU] | PEN_INJECTOR | Freq: Every day | SUBCUTANEOUS | 1 refills | Status: DC

## 2020-08-26 MED ORDER — GLIPIZIDE 5 MG PO TABS: 5.0000 mg | ORAL_TABLET | Freq: Two times a day (BID) | ORAL | 1 refills | Status: DC

## 2020-08-26 NOTE — Patient Instructions (Signed)

## 2020-08-26 NOTE — Progress Notes (Signed)
  Medical Nutrition Therapy:  Appt start time: 1330 end time:  1430.   Assessment:  Primary concerns today: Diabetes Type 2, Obesity. She lives with her husband an she does the cooking and shopping. Works from home. Eats 3 meals per day. See Dr. Fransico Him, Endocrinology. Glipidize 5 g BID,  Tresiba 30 units a day, Has cut sweets, cakes and sweet tea or desserts.  Still struggles with soda- Coke from 3 a day to one only every 3 days. Trying to cut out snacks and watching bread. Walking 1 mile 4 times per week.   Lab Results  Component Value Date   HGBA1C 8.0 (A) 08/26/2020   CMP Latest Ref Rng & Units 05/20/2020 01/01/2020 12/05/2019  Glucose 70 - 99 mg/dL 185(U) 314(H) 702(O)  BUN 6 - 20 mg/dL 9 12 12   Creatinine 0.44 - 1.00 mg/dL 3.78 5.88)  Sodium 135 - 145 mmol/L 135 137 138  Potassium 3.5 - 5.1 mmol/L 3.4(L) 3.5 2.9(L)  Chloride 98 - 111 mmol/L 99 100 102  CO2 22 - 32 mmol/L 26 24 27   Calcium 8.9 - 10.3 mg/dL 9.3 9.1 5.02(D)  Total Protein 6.5 - 8.1 g/dL - 7.5 7.6  Total Bilirubin 0.3 - 1.2 mg/dL - 0.5 0.4  Alkaline Phos 38 - 126 U/L - 69 67  AST 15 - 41 U/L - 17 17  ALT 0 - 44 U/L - 17 17     Preferred Learning Style:   No preference indicated   Learning Readiness:  Ready  Change in progress   MEDICATIONS:   DIETARY INTAKE:   24-hr recall:   Trying to work on eating 3 meals per day.   Usual physical activity: Walks  Estimated energy needs: 1500  calories 170 g carbohydrates 112 g protein 42 g fat  Progress Towards Goal(s):  In progress.   Nutritional Diagnosis:  NB-1.1 Food and nutrition-related knowledge deficit As related to Diabetes Type 2 and Obesity.  As evidenced by A1C 8% and BMI 37.    Intervention:  Nutrition and Diabetes education provided on My Plate, CHO counting, meal planning, portion sizes, timing of meals, avoiding snacks between meals unless having a low blood sugar, target ranges for A1C and blood sugars, signs/symptoms and  treatment of hyper/hypoglycemia, monitoring blood sugars, taking medications as prescribed, benefits of exercising 30 minutes per day and prevention of complications of DM.  Goals Follow MY Plate Eat three meals at times discussed. Cut out snacks. Drink only water-cut out diet soda Increase walking to 30 minutes a day. Test BS in am an before bed:  Get A1C down to 7% Lose 1 lb per week.    Teaching Method Utilized:  Visual Auditory Hands on  Handouts given during visit include:  The Plate Method   Meal Plan Card  Diabetes Instructions.   Barriers to learning/adherence to lifestyle change: none  Demonstrated degree of understanding via:  Teach Back   Monitoring/Evaluation:  Dietary intake, exercise, , and body weight in 1 month(s).

## 2020-08-26 NOTE — Progress Notes (Signed)
08/26/2020, 2:56 PM   Endocrinology follow-up note  Subjective:    Patient ID: Victoria Williams, female    DOB: 11-19-1965.  Victoria Williams is being seen in follow-up after she was seen in consultation for management of currently uncontrolled symptomatic diabetes requested by  Sharilyn Sites, MD.   Past Medical History:  Diagnosis Date  . Arthritis   . Bell's palsy    approx 2015  . Chronic kidney disease   . Diabetes mellitus    only when on steriods  . Esophagitis 2012   EGD  . Family history of adverse reaction to anesthesia    PONV and hallucinations-MOM  . Henoch-Schonlein purpura ([place])   . History of kidney stones   . History of UTI   . Hypertension   . IBS (irritable bowel syndrome)   . Obesity   . Sleep apnea     Past Surgical History:  Procedure Laterality Date  . ABDOMINAL HYSTERECTOMY  2003  . ACHILLES TENDON SURGERY Left   . CESAREAN SECTION    . CHOLECYSTECTOMY N/A 01/03/2020   Procedure: LAPAROSCOPIC CHOLECYSTECTOMY;  Surgeon: Virl Cagey, MD;  Location: [facility];  Service: General;  Laterality: N/A;  . KNEE SURGERY Left    arthroscopic  . NECK SURGERY  2000   cervical fusion 6/7  . REPLACEMENT TOTAL KNEE Left 2019   [facility]  . TONSILECTOMY, ADENOIDECTOMY, BILATERAL MYRINGOTOMY AND TUBES    . TONSILLECTOMY    . TUBAL LIGATION  1996    Social History   Socioeconomic History  . Marital status: Married    Spouse name: Not on file  . Number of children: 3  . Years of education: some college  . Highest education level: Not on file  Occupational History    Comment: [employer], loan processor  Tobacco Use  . Smoking status: Never Smoker  . Smokeless tobacco: Never Used  Vaping Use  . Vaping Use: Never used  Substance and Sexual Activity  . Alcohol use: Yes    Alcohol/week: 1.0 standard drink    Types: 1 Glasses of wine per week    Comment: 2 a month   . Drug  use: No  . Sexual activity: Yes    Birth control/protection: Surgical  Other Topics Concern  . Not on file  Social History Narrative   Lives with spouse   2 caffeine drinks daily    Social Determinants of Health   Financial Resource Strain:   . Difficulty of Paying Living Expenses: Not on file  Food Insecurity:   . Worried About Charity fundraiser in the Last Year: Not on file  . Ran Out of Food in the Last Year: Not on file  Transportation Needs:   . Lack of Transportation (Medical): Not on file  . Lack of Transportation (Non-Medical): Not on file  Physical Activity:   . Days of Exercise per Week: Not on file  . Minutes of Exercise per Session: Not on file  Stress:   . Feeling of Stress : Not on file  Social Connections:   . Frequency of Communication  with Friends and Family: Not on file  . Frequency of Social Gatherings with Friends and Family: Not on file  . Attends Religious Services: Not on file  . Active Member of Clubs or Organizations: Not on file  . Attends Archivist Meetings: Not on file  . Marital Status: Not on file    Family History  Problem Relation Age of Onset  . Leukemia Mother   . Diabetes Mother   . Breast cancer Mother   . Hypertension Mother   . Stroke Mother   . Esophageal cancer Brother   . Diabetes Maternal Aunt   . Diabetes Maternal Uncle   . Diabetes Maternal Grandfather   . Heart attack Father   . Heart failure Father   . Colon cancer Neg Hx     Outpatient Encounter Medications as of 08/26/2020  Medication Sig  . acetaminophen (TYLENOL) 500 MG tablet Take 1,000 mg by mouth 2 (two) times daily as needed for headache.  . Blood Glucose Monitoring Suppl (ACCU-CHEK GUIDE) w/Device KIT 1 Piece by Does not apply route as directed.  . calcium carbonate (TUMS - DOSED IN MG ELEMENTAL CALCIUM) 500 MG chewable tablet Chew 1 tablet by mouth daily as needed for indigestion or heartburn.   . cholecalciferol (VITAMIN D3) 25 MCG (1000 UNIT)  tablet Take 2,000 Units by mouth daily.  [name] dicyclomine (BENTYL) 10 MG capsule Take 10 mg by mouth 2 (two) times daily.   [name] glipiZIDE (GLUCOTROL) 5 MG tablet Take 1 tablet (5 mg total) by mouth 2 (two) times daily.  [name] glucose blood (ACCU-CHEK GUIDE) test strip Use as instructed  . ibuprofen (ADVIL,MOTRIN) 200 MG tablet Take 600 mg by mouth 2 (two) times daily as needed for moderate pain.  [name] insulin degludec (TRESIBA FLEXTOUCH) 100 UNIT/ML FlexTouch Pen Inject 40 Units into the skin at bedtime.  . Insulin Pen Needle (B-D ULTRAFINE III SHORT PEN) 31G X 8 MM MISC 1 each by Does not apply route as directed.  [name] losartan-hydrochlorothiazide (HYZAAR) 100-25 MG tablet Take 1 tablet by mouth daily.   . polyethylene glycol (MIRALAX) 17 g packet Take 17 g by mouth daily as needed for moderate constipation.  . rosuvastatin (CRESTOR) 10 MG tablet Take 1 tablet (10 mg total) by mouth daily.  [name] tolterodine (DETROL) 2 MG tablet Take 2 mg by mouth 2 (two) times daily.  . [DISCONTINUED] glipiZIDE (GLUCOTROL) 5 MG tablet Take 1 tablet (5 mg total) by mouth 2 (two) times daily.  . [DISCONTINUED] insulin degludec (TRESIBA FLEXTOUCH) 100 UNIT/ML FlexTouch Pen Inject 20 Units into the skin daily. (Patient taking differently: Inject 30 Units into the skin daily. )   No facility-administered encounter medications on file as of 08/26/2020.    ALLERGIES: Allergies  Allergen Reactions  . Hydromorphone Hcl Nausea And Vomiting  . Sulfa Antibiotics Nausea And Vomiting  . Cephalexin Hives  . Ciprocin-Fluocin-Procin [Fluocinolone Acetonide] Hives  . Bee Venom Swelling    REACTION: Severe swelling  . Levofloxacin Hives  . Celebrex [Celecoxib] Rash    itching  . Penicillins Rash    Did it involve swelling of the face/tongue/throat, SOB, or low BP? No Did it involve sudden or severe rash/hives, skin peeling, or any reaction on the inside of your mouth or nose? Yes Did you need to seek medical attention at a hospital or  doctor's office? Unknown When did it last happen? childhood If all above answers are "NO", may proceed with cephalosporin use.     VACCINATION  STATUS:  There is no immunization history on file for this patient.  Diabetes She presents for her follow-up diabetic visit. She has type 2 diabetes mellitus. Her disease course has been improving. There are no hypoglycemic associated symptoms. Pertinent negatives for hypoglycemia include no confusion, headaches, pallor or seizures. Associated symptoms include fatigue. Pertinent negatives for diabetes include no chest pain, no polydipsia, no polyphagia and no polyuria. There are no hypoglycemic complications. Symptoms are improving. There are no diabetic complications. Risk factors for coronary artery disease include dyslipidemia, diabetes mellitus, family history, hypertension, obesity, post-menopausal and sedentary lifestyle. Current diabetic treatments: She is currently on glipizide 5 mg p.o. twice daily, reports intolerance to Metformin. Her weight is fluctuating minimally. She is following a generally unhealthy diet. When asked about meal planning, she reported none. She has not had a previous visit with a dietitian. She participates in exercise intermittently. Her home blood glucose trend is decreasing steadily. Her breakfast blood glucose range is generally 140-180 mg/dl. Her lunch blood glucose range is generally 140-180 mg/dl. Her dinner blood glucose range is generally 140-180 mg/dl. Her bedtime blood glucose range is generally 140-180 mg/dl. Her overall blood glucose range is 140-180 mg/dl. (She presents with significantly improved glycemic profile.  She did not document hypoglycemia.  She is responding to her basal insulin.  Her point-of-care A1c is 8%, overall improving from 11.4%.   ) An ACE inhibitor/angiotensin II receptor blocker is being taken. Eye exam is current.  Hypertension This is a chronic problem. The current episode started more  than 1 year ago. The problem is controlled. Pertinent negatives include no chest pain, headaches, palpitations or shortness of breath. Risk factors for coronary artery disease include diabetes mellitus, dyslipidemia, family history, obesity, post-menopausal state and sedentary lifestyle. Past treatments include angiotensin blockers.  Hyperlipidemia This is a chronic problem. The current episode started more than 1 year ago. The problem is uncontrolled. Pertinent negatives include no chest pain, myalgias or shortness of breath. She is currently on no antihyperlipidemic treatment. Risk factors for coronary artery disease include diabetes mellitus, dyslipidemia, family history, hypertension, obesity, a sedentary lifestyle and post-menopausal.     Review of Systems  Constitutional: Positive for fatigue. Negative for chills, fever and unexpected weight change.  HENT: Negative for trouble swallowing and voice change.   Eyes: Negative for visual disturbance.  Respiratory: Negative for cough, shortness of breath and wheezing.   Cardiovascular: Negative for chest pain, palpitations and leg swelling.  Gastrointestinal: Negative for diarrhea, nausea and vomiting.  Endocrine: Negative for cold intolerance, heat intolerance, polydipsia, polyphagia and polyuria.  Musculoskeletal: Negative for arthralgias and myalgias.  Skin: Negative for color change, pallor, rash and wound.  Neurological: Negative for seizures and headaches.  Psychiatric/Behavioral: Negative for confusion and suicidal ideas.    Objective:    Vitals with BMI 08/26/2020 08/26/2020 07/24/2020  Height _0  _1  _2   Weight 220 lbs 220 lbs 219 lbs 13 oz  BMI 37.74 67.28 97.91  Systolic 504 - 136  Diastolic 64 - 64  Pulse 80 - 85    BP 106/64   Pulse 80   Ht _3  (1.626 m)   Wt 220 lb (99.8 kg)   BMI 37.76 kg/m   Wt Readings from Last 3 Encounters:  08/26/20 220 lb (99.8 kg)  08/26/20 220 lb (99.8 kg)  07/24/20 219 lb 12.8 oz  (99.7 kg)     Physical Exam Constitutional:      Appearance: She is well-developed.  HENT:  Head: Normocephalic and atraumatic.  Neck:     Thyroid: No thyromegaly.     Trachea: No tracheal deviation.  Cardiovascular:     Rate and Rhythm: Normal rate and regular rhythm.  Pulmonary:     Effort: Pulmonary effort is normal.  Abdominal:     Tenderness: There is no abdominal tenderness. There is no guarding.  Musculoskeletal:        General: Normal range of motion.     Cervical back: Normal range of motion and neck supple.  Skin:    General: Skin is warm and dry.     Coloration: Skin is not pale.     Findings: No erythema or rash.  Neurological:     Mental Status: She is alert and oriented to person, place, and time.     Cranial Nerves: No cranial nerve deficit.     Coordination: Coordination normal.     Deep Tendon Reflexes: Reflexes are normal and symmetric.  Psychiatric:        Judgment: Judgment normal.       CMP ( most recent) CMP     Component Value Date/Time   NA 135 05/20/2020 1521   K 3.4 (L) 05/20/2020 1521   CL 99 05/20/2020 1521   CO2 26 05/20/2020 1521   GLUCOSE 300 (H) 05/20/2020 1521   BUN 9 05/20/2020 1521   CREATININE 0.80 05/20/2020 1521   CALCIUM 9.3 05/20/2020 1521   PROT 7.5 01/01/2020 2005   ALBUMIN 3.9 01/01/2020 2005   AST 17 01/01/2020 2005   ALT 17 01/01/2020 2005   ALKPHOS 69 01/01/2020 2005   BILITOT 0.5 01/01/2020 2005   GFRNONAA >60 05/20/2020 1521   GFRAA >60 05/20/2020 1521     Diabetic Labs (most recent): Lab Results  Component Value Date   HGBA1C 8.0 (A) 08/26/2020   HGBA1C 11.4 05/20/2020   HGBA1C 7.6 (H) 01/01/2020     Lipid Panel ( most recent) Lipid Panel     Component Value Date/Time   CHOL 246 (H) 01/27/2014 0556   TRIG 119 01/27/2014 0556   HDL 43 01/27/2014 0556   CHOLHDL 5.7 01/27/2014 0556   VLDL 24 01/27/2014 0556   LDLCALC 179 (H) 01/27/2014 0556      Lab Results  Component Value Date   TSH  1.738 11/27/2010      Assessment & Plan:   1. Uncontrolled type 2 diabetes mellitus with hyperglycemia (HCC) - [place] has currently uncontrolled symptomatic type 2 DM since  54 years of age.  She presents with significantly improved glycemic profile.  She did not document hypoglycemia.  She is responding to her basal insulin.  Her point-of-care A1c is 8%, overall improving from 11.4%.     Recent labs reviewed. - I had a long discussion with her about the progressive nature of diabetes and the pathology behind its complications. -She did not report gross complications from her diabetes, however she remains at a high risk for more acute and chronic complications which include CAD, CVA, CKD, retinopathy, and neuropathy. These are all discussed in detail with her.  - I have counseled her on diet  and weight management  by adopting a carbohydrate restricted/protein rich diet. Patient is encouraged to switch to  unprocessed or minimally processed     complex starch and increased protein intake (animal or plant source), fruits, and vegetables. -  she is advised to stick to a routine mealtimes to eat 3 meals  a day and avoid unnecessary snacks (  to snack only to correct hypoglycemia).   - she  admits there is a room for improvement in her diet and drink choices. -  Suggestion is made for her to avoid simple carbohydrates  from her diet including Cakes, Sweet Desserts / Pastries, Ice Cream, Soda (diet and regular), Sweet Tea, Candies, Chips, Cookies, Sweet Pastries,  Store Bought Juices, Alcohol in Excess of  1-2 drinks a day, Artificial Sweeteners, Coffee Creamer, and "Sugar-free" Products. This will help patient to have stable blood glucose profile and potentially avoid unintended weight gain.   - she will be scheduled with Jearld Fenton, RDN, CDE for diabetes education.  - I have approached her with the following individualized plan to manage  her diabetes and patient agrees:   -In light  of her presentation with significantly above target glycemic profile, and her intolerance to Metformin and incretin therapy, she was approached for basal insulin and she accepted.  Her presentation is significant for improved glycemic profile.  -I discussed and increase her Tyler Aas to 40 units nightly, associated with monitoring of blood glucose 2 times a day-before breakfast and at bedtime.    - she is encouraged to call clinic for blood glucose levels less than 70 or above 200 mg /dl. - she is advised to continue glipizide 5 mg p.o. twice daily with breakfast and supper therapeutically suitable for patient . -She reports intolerance to Metformin and incretin therapy.    - Specific targets for  A1c;  LDL, HDL,  and Triglycerides were discussed with the patient.  2) Blood Pressure /Hypertension: Her blood pressure is controlled to target.   she is advised to continue her current medications including losartan/HCTZ 100/25 mg p.o. daily with breakfast . 3) Lipids/Hyperlipidemia:   Review of her recent lipid panel showed uncontrolled  LDL at 179 .   She was recently initiated on Crestor, continues to tolerate.  She is advised to continue Crestor 10 mg p.o. nightly.     Side effects and precautions discussed with her.  4)  Weight/Diet:  Body mass index is 37.76 kg/m.  -   clearly complicating her diabetes care.   she is  a candidate for weight loss. I discussed with her the fact that loss of 5 - 10% of her  current body weight will have the most impact on her diabetes management.  Exercise, and detailed carbohydrates information provided  -  detailed on discharge instructions.  5) Chronic Care/Health Maintenance:  -she  ison ACEI/ARB and Statin medications and  is encouraged to initiate and continue to follow up with Ophthalmology, Dentist,  Podiatrist at least yearly or according to recommendations, and advised to  stay away from smoking. I have recommended yearly flu vaccine and pneumonia vaccine at  least every 5 years; moderate intensity exercise for up to 150 minutes weekly; and  sleep for at least 7 hours a day.  - she is  advised to maintain close follow up with Sharilyn Sites, MD for primary care needs, as well as her other providers for optimal and coordinated care.   - Time spent on this patient care encounter:  35 min, of which > 50% was spent in  counseling and the rest reviewing her blood glucose logs , discussing her hypoglycemia and hyperglycemia episodes, reviewing her current and  previous labs / studies  ( including abstraction from other facilities) and medications  doses and developing a  long term treatment plan and documenting her care.   Please refer to Patient Instructions  for Blood Glucose Monitoring and Insulin/Medications Dosing Guide"  in media tab for additional information. Please  also refer to " Patient Self Inventory" in the Media  tab for reviewed elements of pertinent patient history.  Luna Fuse Petersheim participated in the discussions, expressed understanding, and voiced agreement with the above plans.  All questions were answered to her satisfaction. she is encouraged to contact clinic should she have any questions or concerns prior to her return visit.  Follow up plan: - Return in about 3 months (around 11/26/2020) for F/U with Pre-visit Labs, Meter, Logs, A1c here., ABI in Office NV, Urine MA - NV.  Glade Lloyd, MD [facility] [address] [place], [place] [address] Phone: [phone]  Fax: [phone]    08/26/2020, 2:56 PM  This note was partially dictated with voice recognition software. Similar sounding words can be transcribed inadequately or may not  be corrected upon review.

## 2020-09-01 ENCOUNTER — Encounter: Payer: Self-pay | Admitting: Nutrition

## 2020-09-01 NOTE — Patient Instructions (Signed)
Goals Follow MY Plate Eat three meals at times discussed. Cut out snacks. Drink only water-cut out diet soda Increase walking to 30 minutes a day. Test BS in am an before bed:  Get A1C down to 7% Lose 1 lb per week.

## 2020-09-09 DIAGNOSIS — Z87442 Personal history of urinary calculi: Secondary | ICD-10-CM | POA: Diagnosis not present

## 2020-09-09 DIAGNOSIS — E119 Type 2 diabetes mellitus without complications: Secondary | ICD-10-CM | POA: Diagnosis not present

## 2020-09-09 DIAGNOSIS — R3129 Other microscopic hematuria: Secondary | ICD-10-CM | POA: Diagnosis not present

## 2020-09-09 DIAGNOSIS — Z862 Personal history of diseases of the blood and blood-forming organs and certain disorders involving the immune mechanism: Secondary | ICD-10-CM | POA: Diagnosis not present

## 2020-09-09 DIAGNOSIS — I10 Essential (primary) hypertension: Secondary | ICD-10-CM | POA: Diagnosis not present

## 2020-09-10 ENCOUNTER — Other Ambulatory Visit: Payer: Self-pay | Admitting: "Endocrinology

## 2020-10-10 ENCOUNTER — Other Ambulatory Visit: Payer: Self-pay | Admitting: "Endocrinology

## 2020-11-24 LAB — COMPREHENSIVE METABOLIC PANEL
ALT: 13 IU/L (ref 0–32)
AST: 14 IU/L (ref 0–40)
Albumin/Globulin Ratio: 2.1 (ref 1.2–2.2)
Albumin: 4.5 g/dL (ref 3.8–4.9)
Alkaline Phosphatase: 88 IU/L (ref 44–121)
BUN/Creatinine Ratio: 21 (ref 9–23)
BUN: 15 mg/dL (ref 6–24)
Bilirubin Total: 0.2 mg/dL (ref 0.0–1.2)
CO2: 25 mmol/L (ref 20–29)
Calcium: 9.3 mg/dL (ref 8.7–10.2)
Chloride: 99 mmol/L (ref 96–106)
Creatinine, Ser: 0.7 mg/dL (ref 0.57–1.00)
GFR calc Af Amer: 114 mL/min/{1.73_m2} (ref >59)
GFR calc non Af Amer: 99 mL/min/{1.73_m2} (ref >59)
Globulin, Total: 2.1 g/dL (ref 1.5–4.5)
Glucose: 147 mg/dL — ABNORMAL HIGH (ref 65–99)
Potassium: 3.9 mmol/L (ref 3.5–5.2)
Sodium: 140 mmol/L (ref 134–144)
Total Protein: 6.6 g/dL (ref 6.0–8.5)

## 2020-11-24 LAB — LIPID PANEL
Chol/HDL Ratio: 3.9 ratio (ref 0.0–4.4)
Cholesterol, Total: 131 mg/dL (ref 100–199)
HDL: 34 mg/dL — ABNORMAL LOW (ref >39)
LDL Chol Calc (NIH): 69 mg/dL (ref 0–99)
Triglycerides: 164 mg/dL — ABNORMAL HIGH (ref 0–149)
VLDL Cholesterol Cal: 28 mg/dL (ref 5–40)

## 2020-11-24 LAB — VITAMIN D 25 HYDROXY (VIT D DEFICIENCY, FRACTURES): Vit D, 25-Hydroxy: 35.5 ng/mL (ref 30.0–100.0)

## 2020-11-24 LAB — T4, FREE: Free T4: 1.07 ng/dL (ref 0.82–1.77)

## 2020-11-24 LAB — TSH: TSH: 2.64 u[IU]/mL (ref 0.450–4.500)

## 2020-11-26 ENCOUNTER — Encounter: Payer: Self-pay | Admitting: Nutrition

## 2020-11-26 ENCOUNTER — Other Ambulatory Visit: Payer: Self-pay

## 2020-11-26 ENCOUNTER — Encounter: Payer: Self-pay | Admitting: "Endocrinology

## 2020-11-26 ENCOUNTER — Ambulatory Visit (INDEPENDENT_AMBULATORY_CARE_PROVIDER_SITE_OTHER): Payer: Managed Care, Other (non HMO) | Admitting: "Endocrinology

## 2020-11-26 ENCOUNTER — Ambulatory Visit: Payer: BC Managed Care – PPO | Admitting: Nutrition

## 2020-11-26 ENCOUNTER — Encounter: Payer: Managed Care, Other (non HMO) | Attending: "Endocrinology | Admitting: Nutrition

## 2020-11-26 VITALS — BP 113/66 | HR 69 | Ht 64.0 in | Wt 215.4 lb

## 2020-11-26 VITALS — Ht 64.0 in | Wt 215.0 lb

## 2020-11-26 DIAGNOSIS — E669 Obesity, unspecified: Secondary | ICD-10-CM

## 2020-11-26 DIAGNOSIS — I10 Essential (primary) hypertension: Secondary | ICD-10-CM

## 2020-11-26 DIAGNOSIS — E782 Mixed hyperlipidemia: Secondary | ICD-10-CM

## 2020-11-26 DIAGNOSIS — E1165 Type 2 diabetes mellitus with hyperglycemia: Secondary | ICD-10-CM

## 2020-11-26 DIAGNOSIS — E559 Vitamin D deficiency, unspecified: Secondary | ICD-10-CM | POA: Diagnosis not present

## 2020-11-26 LAB — POCT GLYCOSYLATED HEMOGLOBIN (HGB A1C): HbA1c, POC (controlled diabetic range): 7.1 % — AB (ref 0.0–7.0)

## 2020-11-26 LAB — POCT UA - MICROALBUMIN
Creatinine, POC: 300 mg/dL
Microalbumin Ur, POC: 80 mg/L

## 2020-11-26 MED ORDER — LOSARTAN POTASSIUM-HCTZ 100-25 MG PO TABS: 1.0000 | ORAL_TABLET | Freq: Every day | ORAL | 1 refills | Status: DC

## 2020-11-26 MED ORDER — TRESIBA FLEXTOUCH 100 UNIT/ML ~~LOC~~ SOPN: 40.0000 [IU] | PEN_INJECTOR | Freq: Every day | SUBCUTANEOUS | 1 refills | Status: DC

## 2020-11-26 MED ORDER — ROSUVASTATIN CALCIUM 10 MG PO TABS: 10.0000 mg | ORAL_TABLET | Freq: Every day | ORAL | 1 refills | Status: DC

## 2020-11-26 MED ORDER — GLIPIZIDE 5 MG PO TABS: 5.0000 mg | ORAL_TABLET | Freq: Two times a day (BID) | ORAL | 1 refills | Status: DC

## 2020-11-26 NOTE — Patient Instructions (Signed)

## 2020-11-26 NOTE — Progress Notes (Signed)
  Medical Nutrition Therapy:  Appt start time: 1450 end time: .1510   Assessment:  Primary concerns today: Diabetes Type 2, Obesity. She lives with her husband an she does the cooking and shopping. Works from home. A1C 7.1% Eats 3 meals per day. See Dr. Fransico Him, Endocrinology. Glipidize 5 mg BID,  Tresiba 40 units a day, Cut out alchohol.  Has cut sweets, cakes and sweet tea or desserts.  Still struggles with soda- Coke from 3 a day to one only every 3 days. FBS 141 mg/dl. Range 130-150's    Lab Results  Component Value Date   HGBA1C 7.1 (A) 11/26/2020   CMP Latest Ref Rng & Units 11/23/2020 05/20/2020 01/01/2020  Glucose 65 - 99 mg/dL 462(V) 035(K) 093(G)  BUN 6 - 24 mg/dL 15 9 12   Creatinine 0.57 - 1.00 mg/dL 1.82 9.93  Sodium 134 - 144 mmol/L 140 135 137  Potassium 3.5 - 5.2 mmol/L 3.9 3.4(L) 3.5  Chloride 96 - 106 mmol/L 99 99 100  CO2 20 - 29 mmol/L 25 26 24   Calcium 8.7 - 10.2 mg/dL 9.3 9.3 9.1  Total Protein 6.0 - 8.5 g/dL 6.6 - 7.5  Total Bilirubin 0.0 - 1.2 mg/dL 0.2 - 0.5  Alkaline Phos 44 - 121 IU/L 88 - 69  AST 0 - 40 IU/L 14 - 17  ALT 0 - 32 IU/L 13 - 17     Preferred Learning Style:   No preference indicated   Learning Readiness:  Ready  Change in progress   MEDICATIONS:   DIETARY INTAKE:   24-hr recall:   Trying to work on eating 3 meals per day. B) 2 eggs, 1 slice toast and cheriros/clementime L)  Hamburger without bun, tomatoes, apples, water Dinner: rib eye steak, onion rings and salad, water Pretzel sicks 15,   Usual physical activity: Walks  Estimated energy needs: 1500  calories 170 g carbohydrates 112 g protein 42 g fat  Progress Towards Goal(s):  In progress.   Nutritional Diagnosis:  NB-1.1 Food and nutrition-related knowledge deficit As related to Diabetes Type 2 and Obesity.  As evidenced by A1C 8% and BMI 37.    Intervention:  Nutrition and Diabetes education provided on My Plate, CHO counting, meal planning, portion  sizes, timing of meals, avoiding snacks between meals unless having a low blood sugar, target ranges for A1C and blood sugars, signs/symptoms and treatment of hyper/hypoglycemia, monitoring blood sugars, taking medications as prescribed, benefits of exercising 30 minutes per day and prevention of complications of DM.  Goals Goal set by patient  20 minutes of walking per day Increase low carb vegetables. Drink 100 oz of water per day Lose 1 lb per week Get FBS less than 130 mg/dl and bedtime less than 7.16 mg/dl.   Teaching Method Utilized:  Visual Auditory Hands on  Handouts given during visit include:  The Plate Method   Meal Plan Card  Diabetes Instructions.   Barriers to learning/adherence to lifestyle change: none  Demonstrated degree of understanding via:  Teach Back   Monitoring/Evaluation:  Dietary intake, exercise, , and body weight in 3 month(s).

## 2020-11-26 NOTE — Patient Instructions (Addendum)
Goal set by patient  20 minutes of walking per day Increase low carb vegetables. Drink 100 oz of water per day Lose 1 lb per week Get FBS less than 130 mg/dl and bedtime less than 253 mg/dl.

## 2020-11-26 NOTE — Progress Notes (Signed)
11/26/2020, 2:06 PM   Endocrinology follow-up note  Subjective:    Patient ID: Victoria Williams, female    DOB: [birth year].  Camiyah SORAYA PAQUETTE is being seen in follow-up after she was seen in consultation for management of currently uncontrolled symptomatic diabetes requested by  Sharilyn Sites, MD.   Past Medical History:  Diagnosis Date  . Arthritis   . Bell's palsy    approx 2015  . Chronic kidney disease   . Diabetes mellitus    only when on steriods  . Esophagitis 2012   EGD  . Family history of adverse reaction to anesthesia    PONV and hallucinations-MOM  . Henoch-Schonlein purpura ([place])   . History of kidney stones   . History of UTI   . Hypertension   . IBS (irritable bowel syndrome)   . Obesity   . Sleep apnea     Past Surgical History:  Procedure Laterality Date  . ABDOMINAL HYSTERECTOMY  2003  . ACHILLES TENDON SURGERY Left   . CESAREAN SECTION    . CHOLECYSTECTOMY N/A 01/03/2020   Procedure: LAPAROSCOPIC CHOLECYSTECTOMY;  Surgeon: Virl Cagey, MD;  Location: [facility];  Service: General;  Laterality: N/A;  . KNEE SURGERY Left    arthroscopic  . NECK SURGERY  2000   cervical fusion 6/7  . REPLACEMENT TOTAL KNEE Left 2019   [facility]  . TONSILECTOMY, ADENOIDECTOMY, BILATERAL MYRINGOTOMY AND TUBES    . TONSILLECTOMY    . TUBAL LIGATION  1996    Social History   Socioeconomic History  . Marital status: Married    Spouse name: Not on file  . Number of children: 3  . Years of education: some college  . Highest education level: Not on file  Occupational History    Comment: [employer], loan processor  Tobacco Use  . Smoking status: Never Smoker  . Smokeless tobacco: Never Used  Vaping Use  . Vaping Use: Never used  Substance and Sexual Activity  . Alcohol use: Yes    Alcohol/week: 1.0 standard drink    Types: 1 Glasses of wine per week    Comment: 2 a month   . Drug  use: No  . Sexual activity: Yes    Birth control/protection: Surgical  Other Topics Concern  . Not on file  Social History Narrative   Lives with spouse   2 caffeine drinks daily    Social Determinants of Health   Financial Resource Strain: Not on file  Food Insecurity: Not on file  Transportation Needs: Not on file  Physical Activity: Not on file  Stress: Not on file  Social Connections: Not on file    Family History  Problem Relation Age of Onset  . Leukemia Mother   . Diabetes Mother   . Breast cancer Mother   . Hypertension Mother   . Stroke Mother   . Esophageal cancer Brother   . Diabetes Maternal Aunt   . Diabetes Maternal Uncle   . Diabetes Maternal Grandfather   . Heart attack Father   . Heart failure Father   . Colon cancer Neg  Hx     Outpatient Encounter Medications as of 11/26/2020  Medication Sig  . acetaminophen (TYLENOL) 500 MG tablet Take 1,000 mg by mouth 2 (two) times daily as needed for headache.  . Blood Glucose Monitoring Suppl (ACCU-CHEK GUIDE) w/Device KIT 1 Piece by Does not apply route as directed.  . calcium carbonate (TUMS - DOSED IN MG ELEMENTAL CALCIUM) 500 MG chewable tablet Chew 1 tablet by mouth daily as needed for indigestion or heartburn.   . cholecalciferol (VITAMIN D3) 25 MCG (1000 UNIT) tablet Take 2,000 Units by mouth daily.  [name] dicyclomine (BENTYL) 10 MG capsule Take 10 mg by mouth 2 (two) times daily.   [name] glipiZIDE (GLUCOTROL) 5 MG tablet Take 1 tablet (5 mg total) by mouth 2 (two) times daily.  [name] glucose blood (ACCU-CHEK GUIDE) test strip Use as instructed to test Blood glucose twice daily  . ibuprofen (ADVIL,MOTRIN) 200 MG tablet Take 600 mg by mouth 2 (two) times daily as needed for moderate pain.  [name] insulin degludec (TRESIBA FLEXTOUCH) 100 UNIT/ML FlexTouch Pen Inject 40 Units into the skin at bedtime.  . Insulin Pen Needle (B-D ULTRAFINE III SHORT PEN) 31G X 8 MM MISC 1 each by Does not apply route as directed.  [name]  losartan-hydrochlorothiazide (HYZAAR) 100-25 MG tablet Take 1 tablet by mouth daily.  . polyethylene glycol (MIRALAX) 17 g packet Take 17 g by mouth daily as needed for moderate constipation.  . rosuvastatin (CRESTOR) 10 MG tablet Take 1 tablet (10 mg total) by mouth daily.  [name] tolterodine (DETROL) 2 MG tablet Take 2 mg by mouth 2 (two) times daily.  . [DISCONTINUED] glipiZIDE (GLUCOTROL) 5 MG tablet Take 1 tablet (5 mg total) by mouth 2 (two) times daily.  . [DISCONTINUED] insulin degludec (TRESIBA FLEXTOUCH) 100 UNIT/ML FlexTouch Pen Inject 40 Units into the skin at bedtime.  . [DISCONTINUED] losartan-hydrochlorothiazide (HYZAAR) 100-25 MG tablet Take 1 tablet by mouth daily.   . [DISCONTINUED] rosuvastatin (CRESTOR) 10 MG tablet Take 1 tablet (10 mg total) by mouth daily.   No facility-administered encounter medications on file as of 11/26/2020.    ALLERGIES: Allergies  Allergen Reactions  . Hydromorphone Hcl Nausea And Vomiting  . Sulfa Antibiotics Nausea And Vomiting  . Cephalexin Hives  . Ciprocin-Fluocin-Procin [Fluocinolone Acetonide] Hives  . Bee Venom Swelling    REACTION: Severe swelling  . Levofloxacin Hives  . Celebrex [Celecoxib] Rash    itching  . Penicillins Rash    Did it involve swelling of the face/tongue/throat, SOB, or low BP? No Did it involve sudden or severe rash/hives, skin peeling, or any reaction on the inside of your mouth or nose? Yes Did you need to seek medical attention at a hospital or doctor's office? Unknown When did it last happen? childhood If all above answers are "NO", may proceed with cephalosporin use.     VACCINATION STATUS:  There is no immunization history on file for this patient.  Diabetes She presents for her follow-up diabetic visit. She has type 2 diabetes mellitus. Her disease course has been improving. There are no hypoglycemic associated symptoms. Pertinent negatives for hypoglycemia include no confusion, headaches, pallor  or seizures. Pertinent negatives for diabetes include no chest pain, no fatigue, no polydipsia, no polyphagia and no polyuria. There are no hypoglycemic complications. Symptoms are improving. There are no diabetic complications. Risk factors for coronary artery disease include dyslipidemia, diabetes mellitus, family history, hypertension, obesity, post-menopausal and sedentary lifestyle. Current diabetic treatment includes insulin injections and oral agent (  monotherapy). Her weight is decreasing steadily. She is following a generally unhealthy diet. When asked about meal planning, she reported none. She has not had a previous visit with a dietitian. She participates in exercise intermittently. Her home blood glucose trend is decreasing steadily. Her breakfast blood glucose range is generally 140-180 mg/dl. Her bedtime blood glucose range is generally 140-180 mg/dl. Her overall blood glucose range is 140-180 mg/dl. (She presents with significantly improving glycemic profile and point-of-care A1c of 7.1%, overall improving from 11.4%.  She did not document any hypoglycemia.  Her average blood glucose is 150-165.   ) An ACE inhibitor/angiotensin II receptor blocker is being taken. Eye exam is current.  Hypertension This is a chronic problem. The current episode started more than 1 year ago. The problem is controlled. Pertinent negatives include no chest pain, headaches, palpitations or shortness of breath. Risk factors for coronary artery disease include diabetes mellitus, dyslipidemia, family history, obesity, post-menopausal state and sedentary lifestyle. Past treatments include angiotensin blockers.  Hyperlipidemia This is a chronic problem. The current episode started more than 1 year ago. The problem is uncontrolled. Pertinent negatives include no chest pain, myalgias or shortness of breath. She is currently on no antihyperlipidemic treatment. Risk factors for coronary artery disease include diabetes  mellitus, dyslipidemia, family history, hypertension, obesity, a sedentary lifestyle and post-menopausal.     Review of Systems  Constitutional: Negative for chills, fatigue, fever and unexpected weight change.  HENT: Negative for trouble swallowing and voice change.   Eyes: Negative for visual disturbance.  Respiratory: Negative for cough, shortness of breath and wheezing.   Cardiovascular: Negative for chest pain, palpitations and leg swelling.  Gastrointestinal: Negative for diarrhea, nausea and vomiting.  Endocrine: Negative for cold intolerance, heat intolerance, polydipsia, polyphagia and polyuria.  Musculoskeletal: Negative for arthralgias and myalgias.  Skin: Negative for color change, pallor, rash and wound.  Neurological: Negative for seizures and headaches.  Psychiatric/Behavioral: Negative for confusion and suicidal ideas.    Objective:    Vitals with BMI 11/26/2020 08/26/2020 08/26/2020  Height 5' 4"  5' 4"  5' 4"   Weight 215 lbs 6 oz 220 lbs 220 lbs  BMI 36.96 58.52 77.82  Systolic 423 536 -  Diastolic 66 64 -  Pulse 69 80 -    BP 113/66   Pulse 69   Ht 5' 4"  (1.626 m)   Wt 215 lb 6.4 oz (97.7 kg)   BMI 36.97 kg/m   Wt Readings from Last 3 Encounters:  11/26/20 215 lb 6.4 oz (97.7 kg)  08/26/20 220 lb (99.8 kg)  08/26/20 220 lb (99.8 kg)     Physical Exam Constitutional:      Appearance: She is well-developed.  HENT:     Head: Normocephalic and atraumatic.  Neck:     Thyroid: No thyromegaly.     Trachea: No tracheal deviation.  Cardiovascular:     Rate and Rhythm: Normal rate and regular rhythm.  Pulmonary:     Effort: Pulmonary effort is normal.  Abdominal:     Tenderness: There is no abdominal tenderness. There is no guarding.  Musculoskeletal:        General: Normal range of motion.     Cervical back: Normal range of motion and neck supple.  Skin:    General: Skin is warm and dry.     Coloration: Skin is not pale.     Findings: No erythema or  rash.  Neurological:     Mental Status: She is alert and oriented to  person, place, and time.     Cranial Nerves: No cranial nerve deficit.     Coordination: Coordination normal.     Deep Tendon Reflexes: Reflexes are normal and symmetric.  Psychiatric:        Judgment: Judgment normal.       CMP ( most recent) CMP     Component Value Date/Time   NA 140 11/23/2020 1353   K 3.9 11/23/2020 1353   CL 99 11/23/2020 1353   CO2 25 11/23/2020 1353   GLUCOSE 147 (H) 11/23/2020 1353   GLUCOSE 300 (H) 05/20/2020 1521   BUN 15 11/23/2020 1353   CREATININE 0.70 11/23/2020 1353   CALCIUM 9.3 11/23/2020 1353   PROT 6.6 11/23/2020 1353   ALBUMIN 4.5 11/23/2020 1353   AST 14 11/23/2020 1353   ALT 13 11/23/2020 1353   ALKPHOS 88 11/23/2020 1353   BILITOT 0.2 11/23/2020 1353   GFRNONAA 99 11/23/2020 1353   GFRAA 114 11/23/2020 1353     Diabetic Labs (most recent): Lab Results  Component Value Date   HGBA1C 7.1 (A) 11/26/2020   HGBA1C 8.0 (A) 08/26/2020   HGBA1C 11.4 05/20/2020     Lipid Panel ( most recent) Lipid Panel     Component Value Date/Time   CHOL 131 11/23/2020 1353   TRIG 164 (H) 11/23/2020 1353   HDL 34 (L) 11/23/2020 1353   CHOLHDL 3.9 11/23/2020 1353   CHOLHDL 5.7 01/27/2014 0556   VLDL 24 01/27/2014 0556   LDLCALC 69 11/23/2020 1353   LABVLDL 28 11/23/2020 1353      Lab Results  Component Value Date   TSH 2.640 11/23/2020   TSH 1.738 11/27/2010   FREET4 1.07 11/23/2020      Assessment & Plan:   1. Uncontrolled type 2 diabetes mellitus with hyperglycemia (HCC) - McLain has currently uncontrolled symptomatic type 2 DM since  54 years of age.   She presents with significantly improving glycemic profile and point-of-care A1c of 7.1%, overall improving from 11.4%.  She did not document any hypoglycemia.  Her average blood glucose is 150-165.     Recent labs reviewed. - I had a long discussion with her about the progressive nature of diabetes  and the pathology behind its complications. -She did not report gross complications from her diabetes, however she remains at a high risk for more acute and chronic complications which include CAD, CVA, CKD, retinopathy, and neuropathy. These are all discussed in detail with her.  - I have counseled her on diet  and weight management  by adopting a carbohydrate restricted/protein rich diet. Patient is encouraged to switch to  unprocessed or minimally processed     complex starch and increased protein intake (animal or plant source), fruits, and vegetables. -  she is advised to stick to a routine mealtimes to eat 3 meals  a day and avoid unnecessary snacks ( to snack only to correct hypoglycemia).   - she acknowledges that there is a room for improvement in her food and drink choices. - Suggestion is made for her to avoid simple carbohydrates  from her diet including Cakes, Sweet Desserts, Ice Cream, Soda (diet and regular), Sweet Tea, Candies, Chips, Cookies, Store Bought Juices, Alcohol in Excess of  1-2 drinks a day, Artificial Sweeteners,  Coffee Creamer, and "Sugar-free" Products, Lemonade. This will help patient to have more stable blood glucose profile and potentially avoid unintended weight gain.  - she has been scheduled with Jearld Fenton, RDN, CDE for  diabetes education.  - I have approached her with the following individualized plan to manage  her diabetes and patient agrees:   -In light of her presentation with significantly better glycemic profile, she will not need prandial insulin.  She does not tolerate Metformin and incretin therapy.    She has responded and tolerated insulin therapy.  She is advised to continue Tresiba 40 units nightly, associated with monitoring of blood glucose 2 times a day-before breakfast and at bedtime.    - she is encouraged to call clinic for blood glucose levels less than 70 or above 200 mg /dl. - she is advised to continue glipizide 5 mg p.o. twice  daily with breakfast and supper therapeutically suitable for patient .  - Specific targets for  A1c;  LDL, HDL,  and Triglycerides were discussed with the patient.  2) Blood Pressure /Hypertension: Her blood pressure is controlled to target. She has a slightly above target urinary microalbumin.  she is advised to continue her current medications including losartan/HCTZ 100/25 mg p.o. daily with breakfast . 3) Lipids/Hyperlipidemia: Review of her previsit labs show improved LDL of 69, and improving from 179.  She has tolerated and responded to Crestor.  I advised her to continue Crestor 10 mg p.o. nightly.    Side effects and precautions discussed with her.  4)  Weight/Diet:  Body mass index is 36.97 kg/m.  -   clearly complicating her diabetes care.   she is  a candidate for weight loss. I discussed with her the fact that loss of 5 - 10% of her  current body weight will have the most impact on her diabetes management.  Exercise, and detailed carbohydrates information provided  -  detailed on discharge instructions.  5) Chronic Care/Health Maintenance:  -she  ison ACEI/ARB and Statin medications and  is encouraged to initiate and continue to follow up with Ophthalmology, Dentist,  Podiatrist at least yearly or according to recommendations, and advised to  stay away from smoking. I have recommended yearly flu vaccine and pneumonia vaccine at least every 5 years; moderate intensity exercise for up to 150 minutes weekly; and  sleep for at least 7 hours a day.    POCT ABI Results 11/26/20  Her ABI today is normal November 26, 2020. Right ABI: 1.19      left ABI: 1.17  Right leg systolic / diastolic: 300/92 mmHg Left leg systolic / diastolic: 330/07 mmHg  Arm systolic / diastolic: 622/63 mmHG This study will be repeated in February 2027, or sooner if needed.   - she is  advised to maintain close follow up with Sharilyn Sites, MD for primary care needs, as well as her other providers for optimal  and coordinated care.   - Time spent on this patient care encounter:  35 min, of which > 50% was spent in  counseling and the rest reviewing her blood glucose logs , discussing her hypoglycemia and hyperglycemia episodes, reviewing her current and  previous labs / studies  ( including abstraction from other facilities) and medications  doses and developing a  long term treatment plan and documenting her care.   Please refer to Patient Instructions for Blood Glucose Monitoring and Insulin/Medications Dosing Guide"  in media tab for additional information. Please  also refer to " Patient Self Inventory" in the Media  tab for reviewed elements of pertinent patient history.  Luna Fuse Benefiel participated in the discussions, expressed understanding, and voiced agreement with the above plans.  All questions  were answered to her satisfaction. she is encouraged to contact clinic should she have any questions or concerns prior to her return visit.  Follow up plan: - Return in about 3 months (around 02/23/2021) for Bring Meter and Logs- A1c in Office.  Glade Lloyd, MD [facility] [address] [place], [place] [address] Phone: [phone]  Fax: [phone]    11/26/2020, 2:06 PM  This note was partially dictated with voice recognition software. Similar sounding words can be transcribed inadequately or may not  be corrected upon review.

## 2020-12-23 ENCOUNTER — Encounter: Payer: Self-pay | Admitting: Nutrition

## 2021-01-04 ENCOUNTER — Ambulatory Visit
Admission: RE | Admit: 2021-01-04 | Discharge: 2021-01-04 | Disposition: A | Discharge status: Home / Self Care | Payer: Managed Care, Other (non HMO) | Source: Ambulatory Visit | Attending: Family Medicine | Admitting: Family Medicine

## 2021-01-04 ENCOUNTER — Other Ambulatory Visit: Payer: Self-pay

## 2021-01-04 VITALS — BP 122/74 | HR 82 | Temp 98.4°F | Resp 16

## 2021-01-04 DIAGNOSIS — K047 Periapical abscess without sinus: Secondary | ICD-10-CM

## 2021-01-04 MED ORDER — DOXYCYCLINE HYCLATE 100 MG PO CAPS: 100.0000 mg | ORAL_CAPSULE | Freq: Two times a day (BID) | ORAL | 0 refills | Status: AC

## 2021-01-04 MED ORDER — LIDOCAINE VISCOUS HCL 2 % MT SOLN: 15.0000 mL | OROMUCOSAL | 0 refills | Status: DC | PRN

## 2021-01-04 NOTE — ED Provider Notes (Signed)
[facility]    CSN: [id number] Arrival date & time: 01/04/21  1557      History   Chief Complaint No chief complaint on file.   HPI Victoria Williams is a 55 y.o. female.   HPI  Patient with a history of chronic facial palsy and type two diabetes presents today with left-sided dental pain.  She reports symptoms have been present for approximately 1 week.  She is making efforts to get in with her dentist however is requesting treatment for possible dental infection involving left upper and lower molar teeth.  She has gum hyperplasia, mild facial swelling of the left face and tenderness with palpation.  No visible abscess lesion.  Past Medical History:  Diagnosis Date   Arthritis    Bell's palsy    approx 2015   Chronic kidney disease    Diabetes mellitus    only when on steriods   Esophagitis 2012   EGD   Family history of adverse reaction to anesthesia    PONV and hallucinations-MOM   Henoch-Schonlein purpura ([place])    History of kidney stones    History of UTI    Hypertension    IBS (irritable bowel syndrome)    Obesity    Sleep apnea     Patient Active Problem List   Diagnosis Date Noted   Vitamin D deficiency 08/26/2020   Essential hypertension, benign 08/26/2020   Uncontrolled type 2 diabetes mellitus with hyperglycemia ([place]) 07/01/2020   Mixed hyperlipidemia 07/01/2020   Constipation 02/17/2020   History of adenomatous polyp of colon 02/17/2020   Gallbladder sludge 12/17/2019   Facial paralysis/Bells palsy 01/27/2014   Facial droop 01/26/2014   Facial weakness 01/26/2014   Bloating 09/30/2013   Loss of weight 09/30/2013   Abdominal pain, other specified site 09/30/2013   Abdominal pain, epigastric 02/10/2011   Henoch-Schonlein purpura ([place]) 02/10/2011   Diabetes mellitus without mention of complication 01/60/1093    Past Surgical History:  Procedure Laterality Date   ABDOMINAL HYSTERECTOMY  2003   [facility]  TENDON SURGERY Left    CESAREAN SECTION     CHOLECYSTECTOMY N/A 01/03/2020   Procedure: LAPAROSCOPIC CHOLECYSTECTOMY;  Surgeon: Virl Cagey, MD;  Location: [facility];  Service: General;  Laterality: N/A;   KNEE SURGERY Left    arthroscopic   NECK SURGERY  2000   cervical fusion 6/7   REPLACEMENT TOTAL KNEE Left 2019   [facility]   TONSILECTOMY, ADENOIDECTOMY, BILATERAL MYRINGOTOMY AND [place]    OB History   No obstetric history on file.      Home Medications    Prior to Admission medications   Medication Sig Start Date End Date Taking? Authorizing Provider  acetaminophen (TYLENOL) 500 MG tablet Take 1,000 mg by mouth 2 (two) times daily as needed for headache.    [provider]  Blood Glucose Monitoring Suppl (ACCU-CHEK GUIDE) w/Device KIT 1 Piece by Does not apply route as directed. 07/01/20   Cassandria Anger, MD  calcium carbonate (TUMS - DOSED IN MG ELEMENTAL CALCIUM) 500 MG chewable tablet Chew 1 tablet by mouth daily as needed for indigestion or heartburn.     [provider]  cholecalciferol (VITAMIN D3) 25 MCG (1000 UNIT) tablet Take 2,000 Units by mouth daily.    [provider]  dicyclomine (BENTYL) 10 MG capsule Take 10 mg by mouth 2 (two) times daily.  03/14/18   [provider]  glipiZIDE (GLUCOTROL) 5 MG tablet Take 1 tablet (5 mg total) by mouth 2 (two) times daily. 11/26/20   Cassandria Anger, MD  glucose blood (ACCU-CHEK GUIDE) test strip Use as instructed to test Blood glucose twice daily 10/12/20   Cassandria Anger, MD  ibuprofen (ADVIL,MOTRIN) 200 MG tablet Take 600 mg by mouth 2 (two) times daily as needed for moderate pain.    [provider]  insulin degludec (TRESIBA FLEXTOUCH) 100 UNIT/ML FlexTouch Pen Inject 40 Units into the skin at bedtime. 11/26/20   Cassandria Anger, MD  Insulin Pen Needle (B-D ULTRAFINE III SHORT PEN) 31G X 8 MM MISC 1 each by  Does not apply route as directed. 07/08/20   Cassandria Anger, MD  losartan-hydrochlorothiazide (HYZAAR) 100-25 MG tablet Take 1 tablet by mouth daily. 11/26/20   Cassandria Anger, MD  polyethylene glycol (MIRALAX) 17 g packet Take 17 g by mouth daily as needed for moderate constipation. 01/03/20   Virl Cagey, MD  rosuvastatin (CRESTOR) 10 MG tablet Take 1 tablet (10 mg total) by mouth daily. 11/26/20   Cassandria Anger, MD  tolterodine (DETROL) 2 MG tablet Take 2 mg by mouth 2 (two) times daily.    [provider]    Family History Family History  Problem Relation Age of Onset   Leukemia Mother    Diabetes Mother    Breast cancer Mother    Hypertension Mother    Stroke Mother    Esophageal cancer Brother    Diabetes Maternal Aunt    Diabetes Maternal Uncle    Diabetes Maternal Grandfather    Heart attack Father    Heart failure Father    Colon cancer Neg Hx     Social History Social History   Tobacco Use   Smoking status: Never Smoker   Smokeless tobacco: Never Used  Scientific laboratory technician Use: Never used  Substance Use Topics   Alcohol use: Yes    Alcohol/week: 1.0 standard drink    Types: 1 Glasses of wine per week    Comment: 2 a month    Drug use: No     Allergies   Hydromorphone hcl, Sulfa antibiotics, Cephalexin, Ciprocin-fluocin-procin [fluocinolone acetonide], Bee venom, Levofloxacin, Celebrex [celecoxib], and Penicillins   Review of Systems Review of Systems  Pertinent negatives listed in HPI  Physical Exam Triage Vital Signs ED Triage Vitals  Enc Vitals Group     BP 01/04/21 1617 122/74     Pulse Rate 01/04/21 1617 82     Resp 01/04/21 1617 16     Temp 01/04/21 1617 98.4 F (36.9 C)     Temp Source 01/04/21 1617 Tympanic     SpO2 01/04/21 1617 95 %     Weight --      Height --      Head Circumference --      Peak Flow --      Pain Score 01/04/21 1622 5     Pain Loc --      Pain Edu? --      Excl. in  [place]? --    No data found.  Updated Vital Signs BP 122/74 (BP Location: Right Arm)    Pulse 82    Temp 98.4 F (36.9 C) (Tympanic)    Resp 16    SpO2 95%   Visual Acuity Right Eye Distance:   Left Eye Distance:   Bilateral Distance:    Right Eye Near:  Left Eye Near:    Bilateral Near:     Physical Exam Constitutional:      Appearance: She is obese.  HENT:     Head: Normocephalic and atraumatic.     Jaw: Tenderness and pain on movement present.     Mouth/Throat:     Dentition: Dental tenderness and dental caries present.  Cardiovascular:     Rate and Rhythm: Normal rate and regular rhythm.     Pulses: Normal pulses.     Heart sounds: Normal heart sounds.  Pulmonary:     Effort: Pulmonary effort is normal.     Breath sounds: Normal breath sounds.  Skin:    General: Skin is warm and dry.     Capillary Refill: Capillary refill takes less than 2 seconds.  Neurological:     Mental Status: She is alert and oriented to person, place, and time.     Cranial Nerves: Cranial nerve deficit present.     Comments: Left chronic facial droop related to Bell's palsy chronic persistent paralysis of facial nerves  Psychiatric:        Mood and Affect: Mood normal.        Behavior: Behavior normal.        Thought Content: Thought content normal.        Judgment: Judgment normal.    UC Treatments / Results  Labs (all labs ordered are listed, but only abnormal results are displayed) Labs Reviewed - No data to display  EKG   Radiology No results found.  Procedures Procedures (including critical care time)  Medications Ordered in UC Medications - No data to display  Initial Impression / Assessment and Plan / UC Course  I have reviewed the triage vital signs and the nursing notes.  Pertinent labs & imaging results that were available during my care of the patient were reviewed by me and considered in my medical decision making (see chart for details).    Patient presents  today for evaluation of dental pain and possible dental infection.  Visible dental caries are present with some trace facial swelling and gum hyperplasia.  Treating for acute dental infection with doxycycline 100 mg twice daily for 7 days.  For acute pain lidocaine 2% viscous applied directly to affected area.  Patient scheduled to see dentist in the next 3 weeks keep scheduled follow-up.  ER precautions given. Final Clinical Impressions(s) / UC Diagnoses   Final diagnoses:  Dental infection   Discharge Instructions   None    ED Prescriptions    Medication Sig Dispense Auth. Provider   doxycycline (VIBRAMYCIN) 100 MG capsule Take 1 capsule (100 mg total) by mouth 2 (two) times daily for 7 days. 14 capsule Scot Jun, FNP   lidocaine (XYLOCAINE) 2 % solution Use as directed 15 mLs in the mouth or throat as needed for mouth pain. 100 mL Scot Jun, FNP     PDMP not reviewed this encounter.   Scot Jun, FNP 01/05/21 [phone]

## 2021-01-04 NOTE — ED Triage Notes (Signed)
LT sided dental pain x 1 week, pt currently has bells palsy's.

## 2021-02-23 ENCOUNTER — Ambulatory Visit (INDEPENDENT_AMBULATORY_CARE_PROVIDER_SITE_OTHER): Payer: Managed Care, Other (non HMO) | Admitting: "Endocrinology

## 2021-02-23 ENCOUNTER — Encounter: Payer: Self-pay | Admitting: "Endocrinology

## 2021-02-23 ENCOUNTER — Ambulatory Visit: Payer: Managed Care, Other (non HMO) | Admitting: Nutrition

## 2021-02-23 ENCOUNTER — Other Ambulatory Visit: Payer: Self-pay

## 2021-02-23 VITALS — BP 110/64 | HR 72 | Ht 64.0 in | Wt 217.0 lb

## 2021-02-23 DIAGNOSIS — E1165 Type 2 diabetes mellitus with hyperglycemia: Secondary | ICD-10-CM

## 2021-02-23 DIAGNOSIS — E782 Mixed hyperlipidemia: Secondary | ICD-10-CM

## 2021-02-23 DIAGNOSIS — I10 Essential (primary) hypertension: Secondary | ICD-10-CM

## 2021-02-23 DIAGNOSIS — E559 Vitamin D deficiency, unspecified: Secondary | ICD-10-CM | POA: Diagnosis not present

## 2021-02-23 LAB — POCT GLYCOSYLATED HEMOGLOBIN (HGB A1C): HbA1c, POC (controlled diabetic range): 7 % (ref 0.0–7.0)

## 2021-02-23 MED ORDER — ONETOUCH VERIO W/DEVICE KIT
1.0000 | PACK | 0 refills | Status: DC | PRN
Start: 2021-02-23 — End: 2022-09-22

## 2021-02-23 MED ORDER — FREESTYLE LIBRE 2 READER DEVI: 0 refills | Status: DC

## 2021-02-23 MED ORDER — FREESTYLE LIBRE 2 SENSOR MISC
1.0000 | 3 refills | Status: DC
Start: 2021-02-23 — End: 2022-02-09

## 2021-02-23 MED ORDER — ONETOUCH VERIO VI STRP: ORAL_STRIP | 2 refills | Status: DC

## 2021-02-23 NOTE — Patient Instructions (Signed)

## 2021-02-23 NOTE — Progress Notes (Signed)
02/23/2021, 2:26 PM   Endocrinology follow-up note  Subjective:    Patient ID: Victoria Williams, female    DOB: [birth year].  Victoria Williams is being seen in follow-up after she was seen in consultation for management of currently uncontrolled symptomatic diabetes requested by  Sharilyn Sites, MD.   Past Medical History:  Diagnosis Date  . Arthritis   . Bell's palsy    approx 2015  . Chronic kidney disease   . Diabetes mellitus    only when on steriods  . Esophagitis 2012   EGD  . Family history of adverse reaction to anesthesia    PONV and hallucinations-MOM  . Henoch-Schonlein purpura ([place])   . History of kidney stones   . History of UTI   . Hypertension   . IBS (irritable bowel syndrome)   . Obesity   . Sleep apnea     Past Surgical History:  Procedure Laterality Date  . ABDOMINAL HYSTERECTOMY  2003  . ACHILLES TENDON SURGERY Left   . CESAREAN SECTION    . CHOLECYSTECTOMY N/A 01/03/2020   Procedure: LAPAROSCOPIC CHOLECYSTECTOMY;  Surgeon: Virl Cagey, MD;  Location: [facility];  Service: General;  Laterality: N/A;  . KNEE SURGERY Left    arthroscopic  . NECK SURGERY  2000   cervical fusion 6/7  . REPLACEMENT TOTAL KNEE Left 2019   [facility]  . TONSILECTOMY, ADENOIDECTOMY, BILATERAL MYRINGOTOMY AND TUBES    . TONSILLECTOMY    . TUBAL LIGATION  1996    Social History   Socioeconomic History  . Marital status: Married    Spouse name: Not on file  . Number of children: 3  . Years of education: some college  . Highest education level: Not on file  Occupational History    Comment: [employer], loan processor  Tobacco Use  . Smoking status: Never Smoker  . Smokeless tobacco: Never Used  Vaping Use  . Vaping Use: Never used  Substance and Sexual Activity  . Alcohol use: Yes    Alcohol/week: 1.0 standard drink    Types: 1 Glasses of wine per week    Comment: 2 a month   . Drug  use: No  . Sexual activity: Yes    Birth control/protection: Surgical  Other Topics Concern  . Not on file  Social History Narrative   Lives with spouse   2 caffeine drinks daily    Social Determinants of Health   Financial Resource Strain: Not on file  Food Insecurity: Not on file  Transportation Needs: Not on file  Physical Activity: Not on file  Stress: Not on file  Social Connections: Not on file    Family History  Problem Relation Age of Onset  . Leukemia Mother   . Diabetes Mother   . Breast cancer Mother   . Hypertension Mother   . Stroke Mother   . Esophageal cancer Brother   . Diabetes Maternal Aunt   . Diabetes Maternal Uncle   . Diabetes Maternal Grandfather   . Heart attack Father   . Heart failure Father   . Colon cancer Neg  Hx     Outpatient Encounter Medications as of 02/23/2021  Medication Sig  . Blood Glucose Monitoring Suppl (ONETOUCH VERIO) w/Device KIT 1 each by Does not apply route as needed.  . Continuous Blood Gluc Receiver (FREESTYLE LIBRE 2 READER) DEVI As directed  . Continuous Blood Gluc Sensor (FREESTYLE LIBRE 2 SENSOR) MISC 1 Piece by Does not apply route every 14 (fourteen) days.  [name] glucose blood (ONETOUCH VERIO) test strip Test glucose 2 x daily  . acetaminophen (TYLENOL) 500 MG tablet Take 1,000 mg by mouth 2 (two) times daily as needed for headache.  . calcium carbonate (TUMS - DOSED IN MG ELEMENTAL CALCIUM) 500 MG chewable tablet Chew 1 tablet by mouth daily as needed for indigestion or heartburn.   . cholecalciferol (VITAMIN D3) 25 MCG (1000 UNIT) tablet Take 2,000 Units by mouth daily.  [name] dicyclomine (BENTYL) 10 MG capsule Take 10 mg by mouth 2 (two) times daily.   [name] glipiZIDE (GLUCOTROL) 5 MG tablet Take 1 tablet (5 mg total) by mouth 2 (two) times daily.  [name] ibuprofen (ADVIL,MOTRIN) 200 MG tablet Take 600 mg by mouth 2 (two) times daily as needed for moderate pain.  [name] insulin degludec (TRESIBA FLEXTOUCH) 100 UNIT/ML FlexTouch Pen  Inject 40 Units into the skin at bedtime.  . Insulin Pen Needle (B-D ULTRAFINE III SHORT PEN) 31G X 8 MM MISC 1 each by Does not apply route as directed.  . lidocaine (XYLOCAINE) 2 % solution Use as directed 15 mLs in the mouth or throat as needed for mouth pain.  [name] losartan-hydrochlorothiazide (HYZAAR) 100-25 MG tablet Take 1 tablet by mouth daily.  . polyethylene glycol (MIRALAX) 17 g packet Take 17 g by mouth daily as needed for moderate constipation.  . rosuvastatin (CRESTOR) 10 MG tablet Take 1 tablet (10 mg total) by mouth daily.  [name] tolterodine (DETROL) 2 MG tablet Take 2 mg by mouth 2 (two) times daily.  . [DISCONTINUED] Blood Glucose Monitoring Suppl (ACCU-CHEK GUIDE) w/Device KIT 1 Piece by Does not apply route as directed.  . [DISCONTINUED] glucose blood (ACCU-CHEK GUIDE) test strip Use as instructed to test Blood glucose twice daily   No facility-administered encounter medications on file as of 02/23/2021.    ALLERGIES: Allergies  Allergen Reactions  . Hydromorphone Hcl Nausea And Vomiting  . Sulfa Antibiotics Nausea And Vomiting  . Cephalexin Hives  . Ciprocin-Fluocin-Procin [Fluocinolone Acetonide] Hives  . Bee Venom Swelling    REACTION: Severe swelling  . Levofloxacin Hives  . Celebrex [Celecoxib] Rash    itching  . Penicillins Rash    Did it involve swelling of the face/tongue/throat, SOB, or low BP? No Did it involve sudden or severe rash/hives, skin peeling, or any reaction on the inside of your mouth or nose? Yes Did you need to seek medical attention at a hospital or doctor's office? Unknown When did it last happen? childhood If all above answers are "NO", may proceed with cephalosporin use.     VACCINATION STATUS:  There is no immunization history on file for this patient.  Diabetes She presents for her follow-up diabetic visit. She has type 2 diabetes mellitus. Her disease course has been improving. There are no hypoglycemic associated symptoms.  Pertinent negatives for hypoglycemia include no confusion, headaches, pallor or seizures. Pertinent negatives for diabetes include no chest pain, no fatigue, no polydipsia, no polyphagia and no polyuria. There are no hypoglycemic complications. Symptoms are improving. There are no diabetic complications. Risk factors for coronary artery disease include  dyslipidemia, diabetes mellitus, family history, hypertension, obesity, post-menopausal and sedentary lifestyle. Current diabetic treatment includes insulin injections and oral agent (monotherapy). Her weight is fluctuating minimally. She is following a generally unhealthy diet. When asked about meal planning, she reported none. She has not had a previous visit with a dietitian. She participates in exercise intermittently. Her home blood glucose trend is decreasing steadily. Her breakfast blood glucose range is generally 130-140 mg/dl. Her bedtime blood glucose range is generally 140-180 mg/dl. Her overall blood glucose range is 140-180 mg/dl. (She presents with continued improvement in her glycemic profile.  Her point-of-care A1c is 7%, overall improving from 11.4%.  She did not document or report hypoglycemia.  Her average blood glucose is 135-140 at fasting, 150-200 at bedtime.   ) An ACE inhibitor/angiotensin II receptor blocker is being taken. Eye exam is current.  Hypertension This is a chronic problem. The current episode started more than 1 year ago. The problem is controlled. Pertinent negatives include no chest pain, headaches, palpitations or shortness of breath. Risk factors for coronary artery disease include diabetes mellitus, dyslipidemia, family history, obesity, post-menopausal state and sedentary lifestyle. Past treatments include angiotensin blockers.  Hyperlipidemia This is a chronic problem. The current episode started more than 1 year ago. The problem is uncontrolled. Pertinent negatives include no chest pain, myalgias or shortness of  breath. She is currently on no antihyperlipidemic treatment. Risk factors for coronary artery disease include diabetes mellitus, dyslipidemia, family history, hypertension, obesity, a sedentary lifestyle and post-menopausal.     Review of Systems  Constitutional: Negative for chills, fatigue, fever and unexpected weight change.  HENT: Negative for trouble swallowing and voice change.   Eyes: Negative for visual disturbance.  Respiratory: Negative for cough, shortness of breath and wheezing.   Cardiovascular: Negative for chest pain, palpitations and leg swelling.  Gastrointestinal: Negative for diarrhea, nausea and vomiting.  Endocrine: Negative for cold intolerance, heat intolerance, polydipsia, polyphagia and polyuria.  Musculoskeletal: Negative for arthralgias and myalgias.  Skin: Negative for color change, pallor, rash and wound.  Neurological: Negative for seizures and headaches.  Psychiatric/Behavioral: Negative for confusion and suicidal ideas.    Objective:    Vitals with BMI 02/23/2021 01/04/2021 11/26/2020  Height 5' 4"  - 5' 4"   Weight 217 lbs - 215 lbs  BMI 51.70 - 01.74  Systolic 944 967 -  Diastolic 64 74 -  Pulse 72 82 -    BP 110/64   Pulse 72   Ht 5' 4"  (1.626 m)   Wt 217 lb (98.4 kg)   BMI 37.25 kg/m   Wt Readings from Last 3 Encounters:  02/23/21 217 lb (98.4 kg)  11/26/20 215 lb (97.5 kg)  11/26/20 215 lb 6.4 oz (97.7 kg)     Physical Exam Constitutional:      Appearance: She is well-developed.  HENT:     Head: Normocephalic and atraumatic.  Neck:     Thyroid: No thyromegaly.     Trachea: No tracheal deviation.  Cardiovascular:     Rate and Rhythm: Normal rate and regular rhythm.  Pulmonary:     Effort: Pulmonary effort is normal.  Abdominal:     Tenderness: There is no abdominal tenderness. There is no guarding.  Musculoskeletal:        General: Normal range of motion.     Cervical back: Normal range of motion and neck supple.  Skin:     General: Skin is warm and dry.     Coloration: Skin is not pale.  Findings: No erythema or rash.  Neurological:     Mental Status: She is alert and oriented to person, place, and time.     Cranial Nerves: No cranial nerve deficit.     Coordination: Coordination normal.     Deep Tendon Reflexes: Reflexes are normal and symmetric.  Psychiatric:        Judgment: Judgment normal.       CMP ( most recent) CMP     Component Value Date/Time   NA 140 11/23/2020 1353   K 3.9 11/23/2020 1353   CL 99 11/23/2020 1353   CO2 25 11/23/2020 1353   GLUCOSE 147 (H) 11/23/2020 1353   GLUCOSE 300 (H) 05/20/2020 1521   BUN 15 11/23/2020 1353   CREATININE 0.70 11/23/2020 1353   CALCIUM 9.3 11/23/2020 1353   PROT 6.6 11/23/2020 1353   ALBUMIN 4.5 11/23/2020 1353   AST 14 11/23/2020 1353   ALT 13 11/23/2020 1353   ALKPHOS 88 11/23/2020 1353   BILITOT 0.2 11/23/2020 1353   GFRNONAA 99 11/23/2020 1353   GFRAA 114 11/23/2020 1353     Diabetic Labs (most recent): Lab Results  Component Value Date   HGBA1C 7.0 02/23/2021   HGBA1C 7.1 (A) 11/26/2020   HGBA1C 8.0 (A) 08/26/2020     Lipid Panel ( most recent) Lipid Panel     Component Value Date/Time   CHOL 131 11/23/2020 1353   TRIG 164 (H) 11/23/2020 1353   HDL 34 (L) 11/23/2020 1353   CHOLHDL 3.9 11/23/2020 1353   CHOLHDL 5.7 01/27/2014 0556   VLDL 24 01/27/2014 0556   LDLCALC 69 11/23/2020 1353   LABVLDL 28 11/23/2020 1353      Lab Results  Component Value Date   TSH 2.640 11/23/2020   TSH 1.738 11/27/2010   FREET4 1.07 11/23/2020      Assessment & Plan:   1. Uncontrolled type 2 diabetes mellitus with hyperglycemia (HCC) - [place] has currently uncontrolled symptomatic type 2 DM since  55 years of age.  She presents with continued improvement in her glycemic profile.  Her point-of-care A1c is 7%, overall improving from 11.4%.  She did not document or report hypoglycemia.  Her average blood glucose is 135-140  at fasting, 150-200 at bedtime.    Recent labs reviewed. - I had a long discussion with her about the progressive nature of diabetes and the pathology behind its complications. -She did not report gross complications from her diabetes, however she remains at a high risk for more acute and chronic complications which include CAD, CVA, CKD, retinopathy, and neuropathy. These are all discussed in detail with her.  - I have counseled her on diet  and weight management  by adopting a carbohydrate restricted/protein rich diet. Patient is encouraged to switch to  unprocessed or minimally processed     complex starch and increased protein intake (animal or plant source), fruits, and vegetables. -  she is advised to stick to a routine mealtimes to eat 3 meals  a day and avoid unnecessary snacks ( to snack only to correct hypoglycemia).   - she acknowledges that there is a room for improvement in her food and drink choices. - Suggestion is made for her to avoid simple carbohydrates  from her diet including Cakes, Sweet Desserts, Ice Cream, Soda (diet and regular), Sweet Tea, Candies, Chips, Cookies, Store Bought Juices, Alcohol in Excess of  1-2 drinks a day, Artificial Sweeteners,  Coffee Creamer, and "Sugar-free" Products, Lemonade. This will help  patient to have more stable blood glucose profile and potentially avoid unintended weight gain.   - she has been scheduled with Jearld Fenton, RDN, CDE for diabetes education.  - I have approached her with the following individualized plan to manage  her diabetes and patient agrees:   -In light of her presentation with significantly better glycemic profile, she will not need prandial insulin for now.  She does not tolerate metformin nor incretin therapy.   She has responded and tolerated insulin therapy.  She is advised to continue Tresiba 40 units nightly, associated with monitoring of blood glucose 1-2 times a day before breakfast and at any other time as  needed.       - she is encouraged to call clinic for blood glucose levels less than 70 or above 200 mg /dl. -Patient will benefit from a CGM.  I discussed and prescribed the freestyle libre device for her. - she is advised to continue glipizide 5 mg p.o. twice daily with breakfast and supper therapeutically suitable for patient .  - Specific targets for  A1c;  LDL, HDL,  and Triglycerides were discussed with the patient.  2) Blood Pressure /Hypertension:  Her blood pressure is controlled to target.  She has a slightly above target urinary microalbumin.  she is advised to continue her current medications including losartan/HCTZ 100/25 mg p.o. daily with breakfast . 3) Lipids/Hyperlipidemia: Review of her previsit labs show improved LDL of 69, and improving from 179.  She has tolerated and responded to Crestor.  She is advised to continue Crestor 10 mg p.o. nightly.   Side effects and precautions discussed with her.  4)  Weight/Diet:  Body mass index is 37.25 kg/m.  -   clearly complicating her diabetes care.   she is  a candidate for weight loss. I discussed with her the fact that loss of 5 - 10% of her  current body weight will have the most impact on her diabetes management.  Exercise, and detailed carbohydrates information provided  -  detailed on discharge instructions.  5) Chronic Care/Health Maintenance:  -she  ison ACEI/ARB and Statin medications and  is encouraged to initiate and continue to follow up with Ophthalmology, Dentist,  Podiatrist at least yearly or according to recommendations, and advised to  stay away from smoking. I have recommended yearly flu vaccine and pneumonia vaccine at least every 5 years; moderate intensity exercise for up to 150 minutes weekly; and  sleep for at least 7 hours a day.  Her screening ABI was negative for PAD in February 2022.  This study will be repeated in February 2027, or sooner if needed.   - she is  advised to maintain close follow up with  Sharilyn Sites, MD for primary care needs, as well as her other providers for optimal and coordinated care.     I spent 35 minutes in the care of the patient today including review of labs from [place], Lipids, Thyroid Function, Hematology (current and previous including abstractions from other facilities); face-to-face time discussing  her blood glucose readings/logs, discussing hypoglycemia and hyperglycemia episodes and symptoms, medications doses, her options of short and long term treatment based on the latest standards of care / guidelines;  discussion about incorporating lifestyle medicine;  and documenting the encounter.    Please refer to Patient Instructions for Blood Glucose Monitoring and Insulin/Medications Dosing Guide"  in media tab for additional information. Please  also refer to " Patient Self Inventory" in the Media  tab  for reviewed elements of pertinent patient history.  Luna Fuse Mulka participated in the discussions, expressed understanding, and voiced agreement with the above plans.  All questions were answered to her satisfaction. she is encouraged to contact clinic should she have any questions or concerns prior to her return visit.   Follow up plan: - Return in about 15 weeks (around 06/08/2021) for F/U with Pre-visit Labs, Meter, Logs, A1c here.Glade Lloyd, MD [facility] [address] [place], [place] [address] Phone: [phone]  Fax: [phone]    02/23/2021, 2:26 PM  This note was partially dictated with voice recognition software. Similar sounding words can be transcribed inadequately or may not  be corrected upon review.

## 2021-03-02 ENCOUNTER — Ambulatory Visit (INDEPENDENT_AMBULATORY_CARE_PROVIDER_SITE_OTHER): Payer: Managed Care, Other (non HMO) | Admitting: "Endocrinology

## 2021-03-02 ENCOUNTER — Other Ambulatory Visit: Payer: Self-pay

## 2021-03-02 DIAGNOSIS — E1165 Type 2 diabetes mellitus with hyperglycemia: Secondary | ICD-10-CM | POA: Diagnosis not present

## 2021-03-02 NOTE — Progress Notes (Signed)
Instructed pt on the set up and use of her Freestyle [place] 2 reader. Demonstrated and applied her sensor to her upper inner L arm. Discussed and answered all questions and advised pt if she had any other questions later to call the office. Pt voiced understanding.

## 2021-03-08 ENCOUNTER — Other Ambulatory Visit: Payer: Self-pay | Admitting: "Endocrinology

## 2021-04-07 ENCOUNTER — Other Ambulatory Visit: Payer: Self-pay | Admitting: "Endocrinology

## 2021-05-07 ENCOUNTER — Other Ambulatory Visit: Payer: Self-pay | Admitting: "Endocrinology

## 2021-06-02 ENCOUNTER — Encounter: Payer: Self-pay | Admitting: "Endocrinology

## 2021-06-02 ENCOUNTER — Ambulatory Visit (INDEPENDENT_AMBULATORY_CARE_PROVIDER_SITE_OTHER): Payer: Managed Care, Other (non HMO) | Admitting: "Endocrinology

## 2021-06-02 ENCOUNTER — Telehealth: Payer: Self-pay

## 2021-06-02 ENCOUNTER — Other Ambulatory Visit: Payer: Self-pay

## 2021-06-02 VITALS — BP 108/72 | HR 84 | Ht 64.0 in | Wt 214.0 lb

## 2021-06-02 DIAGNOSIS — I10 Essential (primary) hypertension: Secondary | ICD-10-CM | POA: Diagnosis not present

## 2021-06-02 DIAGNOSIS — E559 Vitamin D deficiency, unspecified: Secondary | ICD-10-CM | POA: Diagnosis not present

## 2021-06-02 DIAGNOSIS — E1165 Type 2 diabetes mellitus with hyperglycemia: Secondary | ICD-10-CM

## 2021-06-02 DIAGNOSIS — E782 Mixed hyperlipidemia: Secondary | ICD-10-CM

## 2021-06-02 LAB — HEMOGLOBIN A1C: Hemoglobin A1C: 6.9

## 2021-06-02 NOTE — Patient Instructions (Signed)

## 2021-06-02 NOTE — Telephone Encounter (Signed)
Will you see her without a CMP?

## 2021-06-02 NOTE — Progress Notes (Signed)
06/02/2021, 4:14 PM   Endocrinology follow-up note  Subjective:    Patient ID: Victoria Williams, female    DOB: [birth year].  Victoria Williams is being seen in follow-up after she was seen in consultation for management of currently uncontrolled symptomatic diabetes requested by  Sharilyn Sites, MD.   Past Medical History:  Diagnosis Date   Arthritis    Bell's palsy    approx 2015   Chronic kidney disease    Diabetes mellitus    only when on steriods   Esophagitis 2012   EGD   Family history of adverse reaction to anesthesia    PONV and hallucinations-MOM   Henoch-Schonlein purpura ([place])    History of kidney stones    History of UTI    Hypertension    IBS (irritable bowel syndrome)    Obesity    Sleep apnea     Past Surgical History:  Procedure Laterality Date   ABDOMINAL HYSTERECTOMY  2003   ACHILLES TENDON SURGERY Left    CESAREAN SECTION     CHOLECYSTECTOMY N/A 01/03/2020   Procedure: LAPAROSCOPIC CHOLECYSTECTOMY;  Surgeon: Virl Cagey, MD;  Location: [facility];  Service: General;  Laterality: N/A;   KNEE SURGERY Left    arthroscopic   NECK SURGERY  2000   cervical fusion 6/7   REPLACEMENT TOTAL KNEE Left 2019   [facility]   TONSILECTOMY, ADENOIDECTOMY, BILATERAL MYRINGOTOMY AND TUBES     TONSILLECTOMY     TUBAL LIGATION  1996    Social History   Socioeconomic History   Marital status: Married    Spouse name: Not on file   Number of children: 3   Years of education: some college   Highest education level: Not on file  Occupational History    Comment: [employer], loan processor  Tobacco Use   Smoking status: Never   Smokeless tobacco: Never  Vaping Use   Vaping Use: Never used  Substance and Sexual Activity   Alcohol use: Yes    Alcohol/week: 1.0 standard drink    Types: 1 Glasses of wine per week    Comment: 2 a month    Drug use: No   Sexual activity: Yes    Birth  control/protection: Surgical  Other Topics Concern   Not on file  Social History Narrative   Lives with spouse   2 caffeine drinks daily    Social Determinants of Health   Financial Resource Strain: Not on file  Food Insecurity: Not on file  Transportation Needs: Not on file  Physical Activity: Not on file  Stress: Not on file  Social Connections: Not on file    Family History  Problem Relation Age of Onset   Leukemia Mother    Diabetes Mother    Breast cancer Mother    Hypertension Mother    Stroke Mother    Esophageal cancer Brother    Diabetes Maternal Aunt    Diabetes Maternal Uncle    Diabetes Maternal Grandfather    Heart attack Father    Heart failure Father    Colon cancer Neg Hx  Outpatient Encounter Medications as of 06/02/2021  Medication Sig   acetaminophen (TYLENOL) 500 MG tablet Take 1,000 mg by mouth 2 (two) times daily as needed for headache.   B-D ULTRAFINE III SHORT PEN 31G X 8 MM MISC USE EVERY DAY   Blood Glucose Monitoring Suppl (ONETOUCH VERIO) w/Device KIT 1 each by Does not apply route as needed.   calcium carbonate (TUMS - DOSED IN MG ELEMENTAL CALCIUM) 500 MG chewable tablet Chew 1 tablet by mouth daily as needed for indigestion or heartburn.    cholecalciferol (VITAMIN D3) 25 MCG (1000 UNIT) tablet Take 2,000 Units by mouth daily.   Continuous Blood Gluc Receiver (FREESTYLE LIBRE 2 READER) DEVI As directed   Continuous Blood Gluc Sensor (FREESTYLE LIBRE 2 SENSOR) MISC 1 Piece by Does not apply route every 14 (fourteen) days.   dicyclomine (BENTYL) 10 MG capsule Take 10 mg by mouth 2 (two) times daily.    glipiZIDE (GLUCOTROL) 5 MG tablet Take 1 tablet (5 mg total) by mouth 2 (two) times daily.   glucose blood (ONETOUCH VERIO) test strip Test glucose 2 x daily   ibuprofen (ADVIL,MOTRIN) 200 MG tablet Take 600 mg by mouth 2 (two) times daily as needed for moderate pain.   lidocaine (XYLOCAINE) 2 % solution Use as directed 15 mLs in the mouth or  throat as needed for mouth pain.   losartan-hydrochlorothiazide (HYZAAR) 100-25 MG tablet Take 1 tablet by mouth daily.   polyethylene glycol (MIRALAX) 17 g packet Take 17 g by mouth daily as needed for moderate constipation.   rosuvastatin (CRESTOR) 10 MG tablet TAKE 1 TABLET(10 MG) BY MOUTH DAILY   tolterodine (DETROL) 2 MG tablet Take 2 mg by mouth 2 (two) times daily.   TRESIBA FLEXTOUCH 100 UNIT/ML FlexTouch Pen ADMINISTER 40 UNITS UNDER THE SKIN AT BEDTIME   No facility-administered encounter medications on file as of 06/02/2021.    ALLERGIES: Allergies  Allergen Reactions   Hydromorphone Hcl Nausea And Vomiting   Sulfa Antibiotics Nausea And Vomiting   Cephalexin Hives   Ciprocin-Fluocin-Procin [Fluocinolone Acetonide] Hives   Bee Venom Swelling    REACTION: Severe swelling   Levofloxacin Hives   Celebrex [Celecoxib] Rash    itching   Penicillins Rash    Did it involve swelling of the face/tongue/throat, SOB, or low BP? No Did it involve sudden or severe rash/hives, skin peeling, or any reaction on the inside of your mouth or nose? Yes Did you need to seek medical attention at a hospital or doctor's office? Unknown When did it last happen? childhood       If all above answers are "NO", may proceed with cephalosporin use.     VACCINATION STATUS:  There is no immunization history on file for this patient.  Diabetes She presents for her follow-up diabetic visit. She has type 2 diabetes mellitus. Her disease course has been improving. There are no hypoglycemic associated symptoms. Pertinent negatives for hypoglycemia include no confusion, headaches, pallor or seizures. Pertinent negatives for diabetes include no chest pain, no fatigue, no polydipsia, no polyphagia and no polyuria. There are no hypoglycemic complications. Symptoms are improving. There are no diabetic complications. Risk factors for coronary artery disease include dyslipidemia, diabetes mellitus, family history,  hypertension, obesity, post-menopausal and sedentary lifestyle. Current diabetic treatment includes insulin injections and oral agent (monotherapy). Her weight is fluctuating minimally. She is following a generally unhealthy diet. When asked about meal planning, she reported none. She has not had a previous visit with a  dietitian. She participates in exercise intermittently. Her home blood glucose trend is decreasing steadily. Her breakfast blood glucose range is generally 110-130 mg/dl. Her bedtime blood glucose range is generally 140-180 mg/dl. Her overall blood glucose range is 140-180 mg/dl. (She presents with continued improvement in her glycemic profile.  Her point-of-care A1c 6.9%, overall improving from 11.4% she did not document any hypoglycemia.    ) An ACE inhibitor/angiotensin II receptor blocker is being taken. Eye exam is current.  Hypertension This is a chronic problem. The current episode started more than 1 year ago. The problem is controlled. Pertinent negatives include no chest pain, headaches, palpitations or shortness of breath. Risk factors for coronary artery disease include diabetes mellitus, dyslipidemia, family history, obesity, post-menopausal state and sedentary lifestyle. Past treatments include angiotensin blockers.  Hyperlipidemia This is a chronic problem. The current episode started more than 1 year ago. The problem is uncontrolled. Pertinent negatives include no chest pain, myalgias or shortness of breath. She is currently on no antihyperlipidemic treatment. Risk factors for coronary artery disease include diabetes mellitus, dyslipidemia, family history, hypertension, obesity, a sedentary lifestyle and post-menopausal.    Review of Systems  Constitutional:  Negative for chills, fatigue, fever and unexpected weight change.  HENT:  Negative for trouble swallowing and voice change.   Eyes:  Negative for visual disturbance.  Respiratory:  Negative for cough, shortness of  breath and wheezing.   Cardiovascular:  Negative for chest pain, palpitations and leg swelling.  Gastrointestinal:  Negative for diarrhea, nausea and vomiting.  Endocrine: Negative for cold intolerance, heat intolerance, polydipsia, polyphagia and polyuria.  Musculoskeletal:  Negative for arthralgias and myalgias.  Skin:  Negative for color change, pallor, rash and wound.  Neurological:  Negative for seizures and headaches.  Psychiatric/Behavioral:  Negative for confusion and suicidal ideas.    Objective:    Vitals with BMI 06/02/2021 02/23/2021 01/04/2021  Height 5' 4"  5' 4"  -  Weight 214 lbs 217 lbs -  BMI 58.85 02.77 -  Systolic 412 878 676  Diastolic 72 64 74  Pulse 84 72 82    BP 108/72   Pulse 84   Ht 5' 4"  (1.626 m)   Wt 214 lb (97.1 kg)   BMI 36.73 kg/m   Wt Readings from Last 3 Encounters:  06/02/21 214 lb (97.1 kg)  02/23/21 217 lb (98.4 kg)  11/26/20 215 lb (97.5 kg)     Physical Exam Constitutional:      Appearance: She is well-developed.  HENT:     Head: Normocephalic and atraumatic.  Neck:     Thyroid: No thyromegaly.     Trachea: No tracheal deviation.  Cardiovascular:     Rate and Rhythm: Normal rate and regular rhythm.  Pulmonary:     Effort: Pulmonary effort is normal.  Abdominal:     Tenderness: There is no abdominal tenderness. There is no guarding.  Musculoskeletal:        General: Normal range of motion.     Cervical back: Normal range of motion and neck supple.  Skin:    General: Skin is warm and dry.     Coloration: Skin is not pale.     Findings: No erythema or rash.  Neurological:     Mental Status: She is alert and oriented to person, place, and time.     Cranial Nerves: No cranial nerve deficit.     Coordination: Coordination normal.     Deep Tendon Reflexes: Reflexes are normal and symmetric.  Psychiatric:  Judgment: Judgment normal.      CMP ( most recent) CMP     Component Value Date/Time   NA 140 11/23/2020 1353    K 3.9 11/23/2020 1353   CL 99 11/23/2020 1353   CO2 25 11/23/2020 1353   GLUCOSE 147 (H) 11/23/2020 1353   GLUCOSE 300 (H) 05/20/2020 1521   BUN 15 11/23/2020 1353   CREATININE 0.70 11/23/2020 1353   CALCIUM 9.3 11/23/2020 1353   PROT 6.6 11/23/2020 1353   ALBUMIN 4.5 11/23/2020 1353   AST 14 11/23/2020 1353   ALT 13 11/23/2020 1353   ALKPHOS 88 11/23/2020 1353   BILITOT 0.2 11/23/2020 1353   GFRNONAA 99 11/23/2020 1353   GFRAA 114 11/23/2020 1353     Diabetic Labs (most recent): Lab Results  Component Value Date   HGBA1C 7.0 02/23/2021   HGBA1C 7.1 (A) 11/26/2020   HGBA1C 8.0 (A) 08/26/2020     Lipid Panel ( most recent) Lipid Panel     Component Value Date/Time   CHOL 131 11/23/2020 1353   TRIG 164 (H) 11/23/2020 1353   HDL 34 (L) 11/23/2020 1353   CHOLHDL 3.9 11/23/2020 1353   CHOLHDL 5.7 01/27/2014 0556   VLDL 24 01/27/2014 0556   LDLCALC 69 11/23/2020 1353   LABVLDL 28 11/23/2020 1353      Lab Results  Component Value Date   TSH 2.640 11/23/2020   TSH 1.738 11/27/2010   FREET4 1.07 11/23/2020      Assessment & Plan:   1. Uncontrolled type 2 diabetes mellitus with hyperglycemia (HCC) - [place] has currently uncontrolled symptomatic type 2 DM since  55 years of age.  She presents with continued improvement in her glycemic profile.  Her point-of-care A1c 6.9%, overall improving from 11.4% she did not document any hypoglycemia.      Recent labs reviewed. - I had a long discussion with her about the progressive nature of diabetes and the pathology behind its complications. -She did not report gross complications from her diabetes, however she remains at a high risk for more acute and chronic complications which include CAD, CVA, CKD, retinopathy, and neuropathy. These are all discussed in detail with her.  - I have counseled her on diet  and weight management  by adopting a carbohydrate restricted/protein rich diet. Patient is encouraged to switch  to  unprocessed or minimally processed     complex starch and increased protein intake (animal or plant source), fruits, and vegetables. -  she is advised to stick to a routine mealtimes to eat 3 meals  a day and avoid unnecessary snacks ( to snack only to correct hypoglycemia).   - she acknowledges that there is a room for improvement in her food and drink choices. - Suggestion is made for her to avoid simple carbohydrates  from her diet including Cakes, Sweet Desserts, Ice Cream, Soda (diet and regular), Sweet Tea, Candies, Chips, Cookies, Store Bought Juices, Alcohol in Excess of  1-2 drinks a day, Artificial Sweeteners,  Coffee Creamer, and "Sugar-free" Products, Lemonade. This will help patient to have more stable blood glucose profile and potentially avoid unintended weight gain.   - she has been scheduled with Jearld Fenton, RDN, CDE for diabetes education.  - I have approached her with the following individualized plan to manage  her diabetes and patient agrees:   -In light of her presentation with significant improvement in her glycemic profile, she will not need prandial insulin for now.  She did not  tolerate metformin nor incretin therapy.  She has responded to basal insulin.  She is advised to continue Tresiba 40 units nightly,   i associated with monitoring of blood glucose 1-2 times a day before breakfast and at any other time as needed, or use her CGM device 4+ times a day.   - she is encouraged to call clinic for blood glucose levels less than 70 or above 200 mg /dl.  - she is advised to continue glipizide 5 mg p.o. twice daily with breakfast and supper therapeutically suitable for patient .  - Specific targets for  A1c;  LDL, HDL,  and Triglycerides were discussed with the patient.  2) Blood Pressure /Hypertension:  -Her blood pressure is controlled to target.  She has a slightly above target urinary microalbumin.  she is advised to continue her current medications including  losartan/HCTZ 100/25 mg p.o. daily with breakfast . 3) Lipids/Hyperlipidemia: Review of her previsit labs show improved LDL of 69, and improving from 179.  She has tolerated and responded to Crestor.  She is advised to continue Crestor 10 mg p.o. nightly.    Side effects and precautions discussed with her.  4)  Weight/Diet:  Body mass index is 36.73 kg/m.  -   clearly complicating her diabetes care.   she is  a candidate for weight loss. I discussed with her the fact that loss of 5 - 10% of her  current body weight will have the most impact on her diabetes management.  Exercise, and detailed carbohydrates information provided  -  detailed on discharge instructions.  5) Chronic Care/Health Maintenance:  -she  ison ACEI/ARB and Statin medications and  is encouraged to initiate and continue to follow up with Ophthalmology, Dentist,  Podiatrist at least yearly or according to recommendations, and advised to  stay away from smoking. I have recommended yearly flu vaccine and pneumonia vaccine at least every 5 years; moderate intensity exercise for up to 150 minutes weekly; and  sleep for at least 7 hours a day.  Her screening ABI was negative for PAD in February 2022.  This study will be repeated in February 2027, or sooner if needed.   - she is  advised to maintain close follow up with Sharilyn Sites, MD for primary care needs, as well as her other providers for optimal and coordinated care.    I spent 32 minutes in the care of the patient today including review of labs from [facility], Lipids, Thyroid Function, Hematology (current and previous including abstractions from other facilities); face-to-face time discussing  her blood glucose readings/logs, discussing hypoglycemia and hyperglycemia episodes and symptoms, medications doses, her options of short and long term treatment based on the latest standards of care / guidelines;  discussion about incorporating lifestyle medicine;  and documenting the  encounter.    Please refer to Patient Instructions for Blood Glucose Monitoring and Insulin/Medications Dosing Guide"  in media tab for additional information. Please  also refer to " Patient Self Inventory" in the Media  tab for reviewed elements of pertinent patient history.  [place] participated in the discussions, expressed understanding, and voiced agreement with the above plans.  All questions were answered to her satisfaction. she is encouraged to contact clinic should she have any questions or concerns prior to her return visit.    Follow up plan: - Return in about 3 months (around 09/02/2021) for F/U with Pre-visit Labs, Meter, Logs, A1c here.Glade Lloyd, MD [facility] [address] [place], [place] [address] Phone: [phone]  Fax: [phone]    06/02/2021, 4:14 PM  This note was partially dictated with voice recognition software. Similar sounding words can be transcribed inadequately or may not  be corrected upon review.

## 2021-07-20 ENCOUNTER — Telehealth: Payer: Self-pay | Admitting: "Endocrinology

## 2021-07-20 NOTE — Telephone Encounter (Signed)
Pt is aware.  

## 2021-07-20 NOTE — Telephone Encounter (Signed)
Pt was told to call if she had morning readings over 200.  9/20 164  9/21 225  9/22 209  9/23 227  9/24 223  9/25 238  9/26 268  9/27 241  Cb# [phone]

## 2021-07-20 NOTE — Telephone Encounter (Signed)
Left a message requesting a return call to the office. 

## 2021-07-20 NOTE — Telephone Encounter (Signed)
Pt states she's taking Tresiba 40 units qhs and glipizide 5mg  bid.

## 2021-07-31 ENCOUNTER — Other Ambulatory Visit: Payer: Self-pay | Admitting: Nurse Practitioner

## 2021-08-19 ENCOUNTER — Other Ambulatory Visit: Payer: Self-pay | Admitting: "Endocrinology

## 2021-09-02 ENCOUNTER — Ambulatory Visit: Payer: Managed Care, Other (non HMO) | Admitting: "Endocrinology

## 2021-09-03 ENCOUNTER — Telehealth: Payer: Self-pay

## 2021-09-03 ENCOUNTER — Other Ambulatory Visit: Payer: Self-pay | Admitting: "Endocrinology

## 2021-09-03 NOTE — Telephone Encounter (Signed)
Patient called and stated that she is not able to use her CGM anymore and requested OneTouch test strips to test 4 times per day. Refill has been sent to pharmacy.

## 2021-09-04 ENCOUNTER — Other Ambulatory Visit: Payer: Self-pay | Admitting: "Endocrinology

## 2021-09-04 DIAGNOSIS — E1165 Type 2 diabetes mellitus with hyperglycemia: Secondary | ICD-10-CM

## 2021-09-14 LAB — COMPREHENSIVE METABOLIC PANEL
ALT: 14 IU/L (ref 0–32)
AST: 12 IU/L (ref 0–40)
Albumin/Globulin Ratio: 1.8 (ref 1.2–2.2)
Albumin: 4.5 g/dL (ref 3.8–4.9)
Alkaline Phosphatase: 82 IU/L (ref 44–121)
BUN/Creatinine Ratio: 27 — ABNORMAL HIGH (ref 9–23)
BUN: 19 mg/dL (ref 6–24)
Bilirubin Total: 0.3 mg/dL (ref 0.0–1.2)
CO2: 26 mmol/L (ref 20–29)
Calcium: 9.8 mg/dL (ref 8.7–10.2)
Chloride: 100 mmol/L (ref 96–106)
Creatinine, Ser: 0.7 mg/dL (ref 0.57–1.00)
Globulin, Total: 2.5 g/dL (ref 1.5–4.5)
Glucose: 158 mg/dL — ABNORMAL HIGH (ref 70–99)
Potassium: 4 mmol/L (ref 3.5–5.2)
Sodium: 141 mmol/L (ref 134–144)
Total Protein: 7 g/dL (ref 6.0–8.5)
eGFR: 102 mL/min/{1.73_m2} (ref >59)

## 2021-09-14 LAB — VITAMIN D 25 HYDROXY (VIT D DEFICIENCY, FRACTURES): Vit D, 25-Hydroxy: 34 ng/mL (ref 30.0–100.0)

## 2021-09-15 ENCOUNTER — Ambulatory Visit (INDEPENDENT_AMBULATORY_CARE_PROVIDER_SITE_OTHER): Payer: Managed Care, Other (non HMO) | Admitting: "Endocrinology

## 2021-09-15 ENCOUNTER — Encounter: Payer: Self-pay | Admitting: "Endocrinology

## 2021-09-15 VITALS — BP 94/66 | HR 80 | Ht 64.0 in | Wt 214.6 lb

## 2021-09-15 DIAGNOSIS — E559 Vitamin D deficiency, unspecified: Secondary | ICD-10-CM | POA: Diagnosis not present

## 2021-09-15 DIAGNOSIS — E1165 Type 2 diabetes mellitus with hyperglycemia: Secondary | ICD-10-CM

## 2021-09-15 DIAGNOSIS — E782 Mixed hyperlipidemia: Secondary | ICD-10-CM

## 2021-09-15 DIAGNOSIS — I10 Essential (primary) hypertension: Secondary | ICD-10-CM

## 2021-09-15 LAB — POCT GLYCOSYLATED HEMOGLOBIN (HGB A1C): HbA1c, POC (controlled diabetic range): 6.5 % (ref 0.0–7.0)

## 2021-09-15 MED ORDER — TRESIBA FLEXTOUCH 100 UNIT/ML ~~LOC~~ SOPN: 60.0000 [IU] | PEN_INJECTOR | Freq: Every day | SUBCUTANEOUS | 2 refills | Status: DC

## 2021-09-15 NOTE — Patient Instructions (Signed)

## 2021-09-15 NOTE — Progress Notes (Signed)
09/15/2021, 3:22 PM   Endocrinology follow-up note  Subjective:    Patient ID: Victoria Williams, female    DOB: [birth year].  Victoria Williams is being seen in follow-up after she was seen in consultation for management of currently uncontrolled symptomatic diabetes requested by  Sharilyn Sites, MD.   Past Medical History:  Diagnosis Date   Arthritis    Bell's palsy    approx 2015   Chronic kidney disease    Diabetes mellitus    only when on steriods   Esophagitis 2012   EGD   Family history of adverse reaction to anesthesia    PONV and hallucinations-MOM   Henoch-Schonlein purpura ([place])    History of kidney stones    History of UTI    Hypertension    IBS (irritable bowel syndrome)    Obesity    Sleep apnea     Past Surgical History:  Procedure Laterality Date   ABDOMINAL HYSTERECTOMY  2003   ACHILLES TENDON SURGERY Left    CESAREAN SECTION     CHOLECYSTECTOMY N/A 01/03/2020   Procedure: LAPAROSCOPIC CHOLECYSTECTOMY;  Surgeon: Virl Cagey, MD;  Location: [facility];  Service: General;  Laterality: N/A;   KNEE SURGERY Left    arthroscopic   NECK SURGERY  2000   cervical fusion 6/7   REPLACEMENT TOTAL KNEE Left 2019   [facility]   TONSILECTOMY, ADENOIDECTOMY, BILATERAL MYRINGOTOMY AND TUBES     TONSILLECTOMY     TUBAL LIGATION  1996    Social History   Socioeconomic History   Marital status: Married    Spouse name: Not on file   Number of children: 3   Years of education: some college   Highest education level: Not on file  Occupational History    Comment: [employer], loan processor  Tobacco Use   Smoking status: Never   Smokeless tobacco: Never  Vaping Use   Vaping Use: Never used  Substance and Sexual Activity   Alcohol use: Yes    Alcohol/week: 1.0 standard drink    Types: 1 Glasses of wine per week    Comment: 2 a month    Drug use: No   Sexual activity: Yes    Birth  control/protection: Surgical  Other Topics Concern   Not on file  Social History Narrative   Lives with spouse   2 caffeine drinks daily    Social Determinants of Health   Financial Resource Strain: Not on file  Food Insecurity: Not on file  Transportation Needs: Not on file  Physical Activity: Not on file  Stress: Not on file  Social Connections: Not on file    Family History  Problem Relation Age of Onset   Leukemia Mother    Diabetes Mother    Breast cancer Mother    Hypertension Mother    Stroke Mother    Esophageal cancer Brother    Diabetes Maternal Aunt    Diabetes Maternal Uncle    Diabetes Maternal Grandfather    Heart attack Father    Heart failure Father    Colon cancer Neg Hx  Outpatient Encounter Medications as of 09/15/2021  Medication Sig   acetaminophen (TYLENOL) 500 MG tablet Take 1,000 mg by mouth 2 (two) times daily as needed for headache.   B-D ULTRAFINE III SHORT PEN 31G X 8 MM MISC USE EVERY DAY   Blood Glucose Monitoring Suppl (ONETOUCH VERIO) w/Device KIT 1 each by Does not apply route as needed.   calcium carbonate (TUMS - DOSED IN MG ELEMENTAL CALCIUM) 500 MG chewable tablet Chew 1 tablet by mouth daily as needed for indigestion or heartburn.  (Patient not taking: Reported on 09/15/2021)   cholecalciferol (VITAMIN D3) 25 MCG (1000 UNIT) tablet Take 2,000 Units by mouth daily.   Continuous Blood Gluc Receiver (FREESTYLE LIBRE 2 READER) DEVI As directed   Continuous Blood Gluc Sensor (FREESTYLE LIBRE 2 SENSOR) MISC 1 Piece by Does not apply route every 14 (fourteen) days.   dicyclomine (BENTYL) 10 MG capsule Take 10 mg by mouth 2 (two) times daily.    glipiZIDE (GLUCOTROL) 5 MG tablet TAKE 1 TABLET(5 MG) BY MOUTH TWICE DAILY   glucose blood (ONETOUCH VERIO) test strip Use to test blood glucose 4 times daily.   ibuprofen (ADVIL,MOTRIN) 200 MG tablet Take 600 mg by mouth 2 (two) times daily as needed for moderate pain.   insulin degludec (TRESIBA  FLEXTOUCH) 100 UNIT/ML FlexTouch Pen Inject 60 Units into the skin at bedtime.   lidocaine (XYLOCAINE) 2 % solution Use as directed 15 mLs in the mouth or throat as needed for mouth pain.   losartan-hydrochlorothiazide (HYZAAR) 100-25 MG tablet Take 1 tablet by mouth daily.   polyethylene glycol (MIRALAX) 17 g packet Take 17 g by mouth daily as needed for moderate constipation.   rosuvastatin (CRESTOR) 10 MG tablet TAKE 1 TABLET(10 MG) BY MOUTH DAILY   tolterodine (DETROL) 2 MG tablet Take 2 mg by mouth 2 (two) times daily.   [DISCONTINUED] TRESIBA FLEXTOUCH 100 UNIT/ML FlexTouch Pen ADMINISTER 40 UNITS UNDER THE SKIN AT BEDTIME (Patient taking differently: 60 Units.)   No facility-administered encounter medications on file as of 09/15/2021.    ALLERGIES: Allergies  Allergen Reactions   Hydromorphone Hcl Nausea And Vomiting   Sulfa Antibiotics Nausea And Vomiting   Cephalexin Hives   Ciprocin-Fluocin-Procin [Fluocinolone Acetonide] Hives   Bee Venom Swelling    REACTION: Severe swelling   Levofloxacin Hives   Celebrex [Celecoxib] Rash    itching   Penicillins Rash    Did it involve swelling of the face/tongue/throat, SOB, or low BP? No Did it involve sudden or severe rash/hives, skin peeling, or any reaction on the inside of your mouth or nose? Yes Did you need to seek medical attention at a hospital or doctor's office? Unknown When did it last happen? childhood       If all above answers are "NO", may proceed with cephalosporin use.     VACCINATION STATUS:  There is no immunization history on file for this patient.  Diabetes She presents for her follow-up diabetic visit. She has type 2 diabetes mellitus. Her disease course has been worsening. There are no hypoglycemic associated symptoms. Pertinent negatives for hypoglycemia include no confusion, headaches, pallor or seizures. Pertinent negatives for diabetes include no chest pain, no fatigue, no polydipsia, no polyphagia and no  polyuria. There are no hypoglycemic complications. Symptoms are improving. There are no diabetic complications. Risk factors for coronary artery disease include dyslipidemia, diabetes mellitus, family history, hypertension, obesity, post-menopausal and sedentary lifestyle. Current diabetic treatment includes insulin injections and oral agent (monotherapy).  Her weight is stable. She is following a generally unhealthy diet. When asked about meal planning, she reported none. She has not had a previous visit with a dietitian. She participates in exercise intermittently. Her home blood glucose trend is decreasing steadily. Her breakfast blood glucose range is generally 130-140 mg/dl. Her lunch blood glucose range is generally 130-140 mg/dl. Her dinner blood glucose range is generally 130-140 mg/dl. Her bedtime blood glucose range is generally 140-180 mg/dl. Her overall blood glucose range is 130-140 mg/dl. (She presents with near target glycemic profile.  Her point-of-care A1c 6.5% overall improving from 11.4%.  She did not document any hypoglycemia.  She did require higher dose of basal insulin since last visit.   ) An ACE inhibitor/angiotensin II receptor blocker is being taken. Eye exam is current.  Hypertension This is a chronic problem. The current episode started more than 1 year ago. The problem is controlled. Pertinent negatives include no chest pain, headaches, palpitations or shortness of breath. Risk factors for coronary artery disease include diabetes mellitus, dyslipidemia, family history, obesity, post-menopausal state and sedentary lifestyle. Past treatments include angiotensin blockers.  Hyperlipidemia This is a chronic problem. The current episode started more than 1 year ago. The problem is uncontrolled. Pertinent negatives include no chest pain, myalgias or shortness of breath. She is currently on no antihyperlipidemic treatment. Risk factors for coronary artery disease include diabetes mellitus,  dyslipidemia, family history, hypertension, obesity, a sedentary lifestyle and post-menopausal.    Review of Systems  Constitutional:  Negative for chills, fatigue, fever and unexpected weight change.  HENT:  Negative for trouble swallowing and voice change.   Eyes:  Negative for visual disturbance.  Respiratory:  Negative for cough, shortness of breath and wheezing.   Cardiovascular:  Negative for chest pain, palpitations and leg swelling.  Gastrointestinal:  Negative for diarrhea, nausea and vomiting.  Endocrine: Negative for cold intolerance, heat intolerance, polydipsia, polyphagia and polyuria.  Musculoskeletal:  Negative for arthralgias and myalgias.  Skin:  Negative for color change, pallor, rash and wound.  Neurological:  Negative for seizures and headaches.  Psychiatric/Behavioral:  Negative for confusion and suicidal ideas.    Objective:    Vitals with BMI 09/15/2021 06/02/2021 02/23/2021  Height 5' 4"  5' 4"  5' 4"   Weight 214 lbs 10 oz 214 lbs 217 lbs  BMI 36.82 75.10 25.85  Systolic 94 277 824  Diastolic 66 72 64  Pulse 80 84 72    BP 94/66   Pulse 80   Ht 5' 4"  (1.626 m)   Wt 214 lb 9.6 oz (97.3 kg)   BMI 36.84 kg/m   Wt Readings from Last 3 Encounters:  09/15/21 214 lb 9.6 oz (97.3 kg)  06/02/21 214 lb (97.1 kg)  02/23/21 217 lb (98.4 kg)     Physical Exam Constitutional:      Appearance: She is well-developed.  HENT:     Head: Normocephalic and atraumatic.  Neck:     Thyroid: No thyromegaly.     Trachea: No tracheal deviation.  Cardiovascular:     Rate and Rhythm: Normal rate and regular rhythm.  Pulmonary:     Effort: Pulmonary effort is normal.  Abdominal:     Tenderness: There is no abdominal tenderness. There is no guarding.  Musculoskeletal:        General: Normal range of motion.     Cervical back: Normal range of motion and neck supple.  Skin:    General: Skin is warm and dry.  Coloration: Skin is not pale.     Findings: No erythema or  rash.  Neurological:     Mental Status: She is alert and oriented to person, place, and time.     Cranial Nerves: No cranial nerve deficit.     Coordination: Coordination normal.     Deep Tendon Reflexes: Reflexes are normal and symmetric.  Psychiatric:        Judgment: Judgment normal.      CMP ( most recent) CMP     Component Value Date/Time   NA 141 09/13/2021 1457   K 4.0 09/13/2021 1457   CL 100 09/13/2021 1457   CO2 26 09/13/2021 1457   GLUCOSE 158 (H) 09/13/2021 1457   GLUCOSE 300 (H) 05/20/2020 1521   BUN 19 09/13/2021 1457   CREATININE 0.70 09/13/2021 1457   CALCIUM 9.8 09/13/2021 1457   PROT 7.0 09/13/2021 1457   ALBUMIN 4.5 09/13/2021 1457   AST 12 09/13/2021 1457   ALT 14 09/13/2021 1457   ALKPHOS 82 09/13/2021 1457   BILITOT 0.3 09/13/2021 1457   GFRNONAA 99 11/23/2020 1353   GFRAA 114 11/23/2020 1353     Diabetic Labs (most recent): Lab Results  Component Value Date   HGBA1C 6.5 09/15/2021   HGBA1C 6.9 06/02/2021   HGBA1C 7.0 02/23/2021     Lipid Panel ( most recent) Lipid Panel     Component Value Date/Time   CHOL 131 11/23/2020 1353   TRIG 164 (H) 11/23/2020 1353   HDL 34 (L) 11/23/2020 1353   CHOLHDL 3.9 11/23/2020 1353   CHOLHDL 5.7 01/27/2014 0556   VLDL 24 01/27/2014 0556   LDLCALC 69 11/23/2020 1353   LABVLDL 28 11/23/2020 1353      Lab Results  Component Value Date   TSH 2.640 11/23/2020   TSH 1.738 11/27/2010   FREET4 1.07 11/23/2020      Assessment & Plan:   1. Uncontrolled type 2 diabetes mellitus with hyperglycemia (HCC) - Aspers has currently uncontrolled symptomatic type 2 DM since  55 years of age.  She presents with near target glycemic profile.  Her point-of-care A1c 6.5% overall improving from 11.4%.  She did not document any hypoglycemia.  She did require higher dose of basal insulin since last visit.      Recent labs reviewed. - I had a long discussion with her about the progressive nature of  diabetes and the pathology behind its complications. -She did not report gross complications from her diabetes, however she remains at a high risk for more acute and chronic complications which include CAD, CVA, CKD, retinopathy, and neuropathy. These are all discussed in detail with her.  - I have counseled her on diet  and weight management  by adopting a carbohydrate restricted/protein rich diet. Patient is encouraged to switch to  unprocessed or minimally processed     complex starch and increased protein intake (animal or plant source), fruits, and vegetables. -  she is advised to stick to a routine mealtimes to eat 3 meals  a day and avoid unnecessary snacks ( to snack only to correct hypoglycemia).   - she acknowledges that there is a room for improvement in her food and drink choices. - Suggestion is made for her to avoid simple carbohydrates  from her diet including Cakes, Sweet Desserts, Ice Cream, Soda (diet and regular), Sweet Tea, Candies, Chips, Cookies, Store Bought Juices, Alcohol in Excess of  1-2 drinks a day, Artificial Sweeteners,  Coffee Creamer, and "Sugar-free"  Products, Lemonade. This will help patient to have more stable blood glucose profile and potentially avoid unintended weight gain.   - she has been scheduled with Jearld Fenton, RDN, CDE for diabetes education.  - I have approached her with the following individualized plan to manage  her diabetes and patient agrees:   -In light of her presentation with near target glycemic profile, she will not need prandial insulin for now.  She is requiring more and more presents Elenor Legato she is advised to continue Tresiba 60 units nightly, associated with monitoring of blood glucose twice a day-daily before breakfast and at bedtime.  For some reason, she did not like to keep the CGM.   -- she is encouraged to call clinic for blood glucose levels less than 70 or above 200 mg /dl. -[employer] plant predominant lifestyle nutrition was  discussed and recommended to her. - she is advised to continue glipizide 5 mg p.o. twice daily with breakfast and supper therapeutically suitable for patient .  - Specific targets for  A1c;  LDL, HDL,  and Triglycerides were discussed with the patient.  2) Blood Pressure /Hypertension:  -Her blood pressure is controlled to target.  She has a slightly above target urinary microalbumin.  she is advised to continue her current medications including losartan/HCTZ 100/25 mg p.o. daily with breakfast .   3) Lipids/Hyperlipidemia: Review of her previsit labs show improved LDL of 69, and last measured triglycerides were 164.  She is advised to continue Crestor 10 mg p.o. nightly.  The above mentioned diet plan will also help with dyslipidemia.     4)  Weight/Diet:  Body mass index is 36.84 kg/m.  -   clearly complicating her diabetes care.   she is  a candidate for weight loss. I discussed with her the fact that loss of 5 - 10% of her  current body weight will have the most impact on her diabetes management.  Exercise, and detailed carbohydrates information provided  -  detailed on discharge instructions.  5) Chronic Care/Health Maintenance:  -she  ison ACEI/ARB and Statin medications and  is encouraged to initiate and continue to follow up with Ophthalmology, Dentist,  Podiatrist at least yearly or according to recommendations, and advised to  stay away from smoking. I have recommended yearly flu vaccine and pneumonia vaccine at least every 5 years; moderate intensity exercise for up to 150 minutes weekly; and  sleep for at least 7 hours a day.  Her screening ABI was negative for PAD in February 2022.  This study will be repeated in February 2027, or sooner if needed.   - she is  advised to maintain close follow up with Sharilyn Sites, MD for primary care needs, as well as her other providers for optimal and coordinated care.   I spent 40 minutes in the care of the patient today including review of  labs from [facility], Lipids, Thyroid Function, Hematology (current and previous including abstractions from other facilities); face-to-face time discussing  her blood glucose readings/logs, discussing hypoglycemia and hyperglycemia episodes and symptoms, medications doses, her options of short and long term treatment based on the latest standards of care / guidelines;  discussion about incorporating lifestyle medicine;  and documenting the encounter.    Please refer to Patient Instructions for Blood Glucose Monitoring and Insulin/Medications Dosing Guide"  in media tab for additional information. Please  also refer to " Patient Self Inventory" in the Media  tab for reviewed elements of pertinent patient history.  Luna Fuse Marrufo participated in the discussions, expressed understanding, and voiced agreement with the above plans.  All questions were answered to her satisfaction. she is encouraged to contact clinic should she have any questions or concerns prior to her return visit.   Follow up plan: - Return in about 4 months (around 01/13/2022) for Bring Meter and Logs- A1c in Office.  Glade Lloyd, MD [facility] [address] [place], [place] [address] Phone: [phone]  Fax: [phone]    09/15/2021, 3:22 PM  This note was partially dictated with voice recognition software. Similar sounding words can be transcribed inadequately or may not  be corrected upon review.

## 2021-10-04 ENCOUNTER — Other Ambulatory Visit: Payer: Self-pay | Admitting: "Endocrinology

## 2021-10-06 ENCOUNTER — Other Ambulatory Visit: Payer: Self-pay

## 2021-10-06 ENCOUNTER — Telehealth: Payer: Self-pay | Admitting: "Endocrinology

## 2021-10-06 MED ORDER — ONETOUCH DELICA LANCETS 33G MISC: 1 refills | Status: DC

## 2021-10-06 NOTE — Telephone Encounter (Signed)
She uses one touch verio

## 2021-10-06 NOTE — Telephone Encounter (Signed)
Onetouch Delica lancets have been sent to the requested pharmacy.

## 2021-10-06 NOTE — Telephone Encounter (Signed)
Pt needs a refill on her Lancets. She said the pharmacy does not have this on file. Please send to [facility] [address]

## 2021-10-29 ENCOUNTER — Other Ambulatory Visit: Payer: Self-pay | Admitting: "Endocrinology

## 2021-10-29 DIAGNOSIS — E1165 Type 2 diabetes mellitus with hyperglycemia: Secondary | ICD-10-CM

## 2021-12-09 ENCOUNTER — Ambulatory Visit
Admission: EM | Admit: 2021-12-09 | Discharge: 2021-12-09 | Disposition: A | Discharge status: Home / Self Care | Payer: Managed Care, Other (non HMO) | Attending: Family Medicine | Admitting: Family Medicine

## 2021-12-09 ENCOUNTER — Other Ambulatory Visit: Payer: Self-pay

## 2021-12-09 ENCOUNTER — Encounter: Payer: Self-pay | Admitting: Emergency Medicine

## 2021-12-09 DIAGNOSIS — N390 Urinary tract infection, site not specified: Secondary | ICD-10-CM | POA: Insufficient documentation

## 2021-12-09 LAB — POCT URINALYSIS DIP (MANUAL ENTRY)
Bilirubin, UA: NEGATIVE
Glucose, UA: NEGATIVE mg/dL
Ketones, POC UA: NEGATIVE mg/dL
Nitrite, UA: NEGATIVE
Protein Ur, POC: NEGATIVE mg/dL
Spec Grav, UA: 1.025 (ref 1.010–1.025)
Urobilinogen, UA: 0.2 E.U./dL
pH, UA: 7 (ref 5.0–8.0)

## 2021-12-09 MED ORDER — NITROFURANTOIN MONOHYD MACRO 100 MG PO CAPS: 100.0000 mg | ORAL_CAPSULE | Freq: Two times a day (BID) | ORAL | 0 refills | Status: DC

## 2021-12-09 NOTE — ED Triage Notes (Signed)
Pt reports urinary frequency, urgency, low back pain x5 days. Pt reports has increased fluids, taking azo otc, and history of same. Pt denies any known fevers.

## 2021-12-09 NOTE — ED Provider Notes (Signed)
[facility]    CSN: [id number] Arrival date & time: 12/09/21  1804      History   Chief Complaint Chief Complaint  Patient presents with   Urinary Frequency   HPI Victoria Williams is a 56 y.o. female.   Here today with 5-day history of urinary frequency, urgency, bilateral flank pain, dysuria.  Denies fever, chills, nausea, vomiting, abdominal pain, chest pain, shortness of breath, hematuria.  History of frequent urinary tract infections, kidney stones.  Has been taking Azo, increasing her fluids with no relief.   Past Medical History:  Diagnosis Date   Arthritis    Bell's palsy    approx 2015   Chronic kidney disease    Diabetes mellitus    only when on steriods   Esophagitis 2012   EGD   Family history of adverse reaction to anesthesia    PONV and hallucinations-MOM   Henoch-Schonlein purpura ([place])    History of kidney stones    History of UTI    Hypertension    IBS (irritable bowel syndrome)    Obesity    Sleep apnea     Patient Active Problem List   Diagnosis Date Noted   Vitamin D deficiency 08/26/2020   Essential hypertension, benign 08/26/2020   Uncontrolled type 2 diabetes mellitus with hyperglycemia ([place]) 07/01/2020   Mixed hyperlipidemia 07/01/2020   Constipation 02/17/2020   History of adenomatous polyp of colon 02/17/2020   Gallbladder sludge 12/17/2019   Facial paralysis/Bells palsy 01/27/2014   Facial droop 01/26/2014   Facial weakness 01/26/2014   Bloating 09/30/2013   Loss of weight 09/30/2013   Abdominal pain, other specified site 09/30/2013   Abdominal pain, epigastric 02/10/2011   Henoch-Schonlein purpura ([place]) 02/10/2011   Diabetes mellitus without mention of complication 04/88/8916    Past Surgical History:  Procedure Laterality Date   ABDOMINAL HYSTERECTOMY  2003   ACHILLES TENDON SURGERY Left    CESAREAN SECTION     CHOLECYSTECTOMY N/A 01/03/2020   Procedure: LAPAROSCOPIC CHOLECYSTECTOMY;  Surgeon: Virl Cagey,  MD;  Location: [facility];  Service: General;  Laterality: N/A;   KNEE SURGERY Left    arthroscopic   NECK SURGERY  2000   cervical fusion 6/7   REPLACEMENT TOTAL KNEE Left 2019   [facility]   TONSILECTOMY, ADENOIDECTOMY, BILATERAL MYRINGOTOMY AND [place]    OB History   No obstetric history on file.      Home Medications    Prior to Admission medications   Medication Sig Start Date End Date Taking? Authorizing Provider  nitrofurantoin, macrocrystal-monohydrate, (MACROBID) 100 MG capsule Take 1 capsule (100 mg total) by mouth 2 (two) times daily. 12/09/21  Yes Volney American, PA-C  acetaminophen (TYLENOL) 500 MG tablet Take 1,000 mg by mouth 2 (two) times daily as needed for headache.    [provider]  B-D ULTRAFINE III SHORT PEN 31G X 8 MM MISC USE EVERY DAY 03/08/21   Cassandria Anger, MD  Blood Glucose Monitoring Suppl (ONETOUCH VERIO) w/Device KIT 1 each by Does not apply route as needed. 02/23/21   Cassandria Anger, MD  calcium carbonate (TUMS - DOSED IN MG ELEMENTAL CALCIUM) 500 MG chewable tablet Chew 1 tablet by mouth daily as needed for indigestion or heartburn.  Patient not taking: Reported on 09/15/2021    [provider]  cholecalciferol (VITAMIN D3) 25 MCG (1000 UNIT) tablet Take 2,000 Units  by mouth daily.    [provider]  Continuous Blood Gluc Receiver (FREESTYLE LIBRE 2 READER) DEVI As directed 02/23/21   Cassandria Anger, MD  Continuous Blood Gluc Sensor (FREESTYLE LIBRE 2 SENSOR) MISC 1 Piece by Does not apply route every 14 (fourteen) days. 02/23/21   Cassandria Anger, MD  dicyclomine (BENTYL) 10 MG capsule Take 10 mg by mouth 2 (two) times daily.  03/14/18   [provider]  glipiZIDE (GLUCOTROL) 5 MG tablet TAKE 1 TABLET(5 MG) BY MOUTH TWICE DAILY 11/01/21   Nida, Marella Chimes, MD  glucose blood (ONETOUCH VERIO) test strip Use to test blood glucose 4 times daily.  09/03/21   Cassandria Anger, MD  ibuprofen (ADVIL,MOTRIN) 200 MG tablet Take 600 mg by mouth 2 (two) times daily as needed for moderate pain.    [provider]  insulin degludec (TRESIBA FLEXTOUCH) 100 UNIT/ML FlexTouch Pen Inject 60 Units into the skin at bedtime. 09/15/21   Cassandria Anger, MD  lidocaine (XYLOCAINE) 2 % solution Use as directed 15 mLs in the mouth or throat as needed for mouth pain. 01/04/21   Scot Jun, FNP  losartan-hydrochlorothiazide (HYZAAR) 100-25 MG tablet Take 1 tablet by mouth daily. 11/26/20   Cassandria Anger, MD  OneTouch Delica Lancets 87O MISC Use to check blood glucose 4 times daily. 10/06/21   Cassandria Anger, MD  polyethylene glycol (MIRALAX) 17 g packet Take 17 g by mouth daily as needed for moderate constipation. 01/03/20   Virl Cagey, MD  rosuvastatin (CRESTOR) 10 MG tablet TAKE 1 TABLET(10 MG) BY MOUTH DAILY 10/04/21   Cassandria Anger, MD  tolterodine (DETROL) 2 MG tablet Take 2 mg by mouth 2 (two) times daily.    [provider]    Family History Family History  Problem Relation Age of Onset   Leukemia Mother    Diabetes Mother    Breast cancer Mother    Hypertension Mother    Stroke Mother    Esophageal cancer Brother    Diabetes Maternal Aunt    Diabetes Maternal Uncle    Diabetes Maternal Grandfather    Heart attack Father    Heart failure Father    Colon cancer Neg Hx     Social History Social History   Tobacco Use   Smoking status: Never   Smokeless tobacco: Never  Vaping Use   Vaping Use: Never used  Substance Use Topics   Alcohol use: Yes    Alcohol/week: 1.0 standard drink    Types: 1 Glasses of wine per week    Comment: 2 a month    Drug use: No     Allergies   Hydromorphone hcl, Sulfa antibiotics, Cephalexin, Ciprocin-fluocin-procin [fluocinolone acetonide], Bee venom, Levofloxacin, Celebrex [celecoxib], and Penicillins   Review of Systems Review of  Systems Per HPI  Physical Exam Triage Vital Signs ED Triage Vitals  Enc Vitals Group     BP 12/09/21 1816 123/71     Pulse Rate 12/09/21 1816 75     Resp 12/09/21 1816 18     Temp 12/09/21 1816 98.3 F (36.8 C)     Temp Source 12/09/21 1816 Oral     SpO2 12/09/21 1816 98 %     Weight 12/09/21 1817 210 lb (95.3 kg)     Height 12/09/21 1817 5' 3" (1.6 m)     Head Circumference --      Peak Flow --      Pain  Score 12/09/21 1817 9     Pain Loc --      Pain Edu? --      Excl. in [place]? --    No data found.  Updated Vital Signs BP 123/71 (BP Location: Right Arm)    Pulse 75    Temp 98.3 F (36.8 C) (Oral)    Resp 18    Ht 5' 3" (1.6 m)    Wt 210 lb (95.3 kg)    SpO2 98%    BMI 37.20 kg/m   Visual Acuity Right Eye Distance:   Left Eye Distance:   Bilateral Distance:    Right Eye Near:   Left Eye Near:    Bilateral Near:     Physical Exam Vitals and nursing note reviewed.  Constitutional:      Appearance: Normal appearance. She is not ill-appearing.  HENT:     Head: Atraumatic.  Eyes:     Extraocular Movements: Extraocular movements intact.     Conjunctiva/sclera: Conjunctivae normal.  Cardiovascular:     Rate and Rhythm: Normal rate and regular rhythm.     Heart sounds: Normal heart sounds.  Pulmonary:     Effort: Pulmonary effort is normal.     Breath sounds: Normal breath sounds.  Abdominal:     General: Bowel sounds are normal. There is no distension.     Palpations: Abdomen is soft.     Tenderness: There is no abdominal tenderness. There is no right CVA tenderness, left CVA tenderness or guarding.  Musculoskeletal:        General: Normal range of motion.     Cervical back: Normal range of motion and neck supple.  Skin:    General: Skin is warm and dry.  Neurological:     Mental Status: She is alert and oriented to person, place, and time.  Psychiatric:        Mood and Affect: Mood normal.        Thought Content: Thought content normal.        Judgment:  Judgment normal.     UC Treatments / Results  Labs (all labs ordered are listed, but only abnormal results are displayed) Labs Reviewed  POCT URINALYSIS DIP (MANUAL ENTRY) - Abnormal; Notable for the following components:      Result Value   Blood, UA trace-intact (*)    Leukocytes, UA Trace (*)    All other components within normal limits  URINE CULTURE    EKG   Radiology No results found.  Procedures Procedures (including critical care time)  Medications Ordered in UC Medications - No data to display  Initial Impression / Assessment and Plan / UC Course  I have reviewed the triage vital signs and the nursing notes.  Pertinent labs & imaging results that were available during my care of the patient were reviewed by me and considered in my medical decision making (see chart for details).     Vitals and exam overall very reassuring, UA with trace leukocytes and trace blood.  Urine culture pending, given her history of recurrent urinary tract infections will start [place] while awaiting these results.  Continue over-the-counter supportive measures  Final Clinical Impressions(s) / UC Diagnoses   Final diagnoses:  Acute lower UTI   Discharge Instructions   None    ED Prescriptions     Medication Sig Dispense Auth. Provider   nitrofurantoin, macrocrystal-monohydrate, (MACROBID) 100 MG capsule Take 1 capsule (100 mg total) by mouth 2 (two) times daily. 10 capsule  Volney American, [place]      PDMP not reviewed this encounter.   Volney American, [place] 12/09/21 1906

## 2021-12-12 LAB — URINE CULTURE: Culture: 50000 — AB

## 2022-01-13 ENCOUNTER — Ambulatory Visit: Payer: Managed Care, Other (non HMO) | Admitting: "Endocrinology

## 2022-01-25 ENCOUNTER — Ambulatory Visit: Payer: Managed Care, Other (non HMO) | Admitting: "Endocrinology

## 2022-01-31 ENCOUNTER — Other Ambulatory Visit: Payer: Self-pay | Admitting: "Endocrinology

## 2022-02-09 ENCOUNTER — Encounter: Payer: Self-pay | Admitting: "Endocrinology

## 2022-02-09 ENCOUNTER — Ambulatory Visit: Payer: BC Managed Care – PPO | Admitting: "Endocrinology

## 2022-02-09 VITALS — BP 98/64 | HR 60 | Ht 64.0 in | Wt 217.2 lb

## 2022-02-09 DIAGNOSIS — E1165 Type 2 diabetes mellitus with hyperglycemia: Secondary | ICD-10-CM | POA: Diagnosis not present

## 2022-02-09 DIAGNOSIS — E559 Vitamin D deficiency, unspecified: Secondary | ICD-10-CM

## 2022-02-09 DIAGNOSIS — E782 Mixed hyperlipidemia: Secondary | ICD-10-CM

## 2022-02-09 DIAGNOSIS — I10 Essential (primary) hypertension: Secondary | ICD-10-CM

## 2022-02-09 LAB — POCT GLYCOSYLATED HEMOGLOBIN (HGB A1C): HbA1c, POC (controlled diabetic range): 6.5 % (ref 0.0–7.0)

## 2022-02-09 MED ORDER — GLIPIZIDE 5 MG PO TABS: ORAL_TABLET | ORAL | 1 refills | Status: DC

## 2022-02-09 MED ORDER — TRESIBA FLEXTOUCH 100 UNIT/ML ~~LOC~~ SOPN: PEN_INJECTOR | SUBCUTANEOUS | 1 refills | Status: DC

## 2022-02-09 NOTE — Patient Instructions (Signed)

## 2022-02-09 NOTE — Progress Notes (Signed)
? ?                                                            ?     02/09/2022, 5:30 PM ? ? ?Endocrinology follow-up note ? ?Subjective:  ? ? Patient ID: Victoria Williams, female    DOB: Aug 30, 1966.  ?Victoria Williams is being seen in follow-up after she was seen in consultation for management of currently uncontrolled symptomatic diabetes requested by  Sharilyn Sites, MD. ? ? ?Past Medical History:  ?Diagnosis Date  ? Arthritis   ? Bell's palsy   ? approx 2015  ? Chronic kidney disease   ? Diabetes mellitus   ? only when on steriods  ? Esophagitis 2012  ? EGD  ? Family history of adverse reaction to anesthesia   ? PONV and hallucinations-MOM  ? Henoch-Schonlein purpura ([place])   ? History of kidney stones   ? History of UTI   ? Hypertension   ? IBS (irritable bowel syndrome)   ? Obesity   ? Sleep apnea   ? ? ?Past Surgical History:  ?Procedure Laterality Date  ? ABDOMINAL HYSTERECTOMY  2003  ? ACHILLES TENDON SURGERY Left   ? CESAREAN SECTION    ? CHOLECYSTECTOMY N/A 01/03/2020  ? Procedure: LAPAROSCOPIC CHOLECYSTECTOMY;  Surgeon: Virl Cagey, MD;  Location: [facility];  Service: General;  Laterality: N/A;  ? KNEE SURGERY Left   ? arthroscopic  ? NECK SURGERY  2000  ? cervical fusion 6/7  ? REPLACEMENT TOTAL KNEE Left 2019  ? [facility]  ? TONSILECTOMY, ADENOIDECTOMY, BILATERAL MYRINGOTOMY AND TUBES    ? TONSILLECTOMY    ? TUBAL LIGATION  1996  ? ? ?Social History  ? ?Socioeconomic History  ? Marital status: Married  ?  Spouse name: Not on file  ? Number of children: 3  ? Years of education: some college  ? Highest education level: Not on file  ?Occupational History  ?  Comment: [employer], loan processor  ?Tobacco Use  ? Smoking status: Never  ? Smokeless tobacco: Never  ?Vaping Use  ? Vaping Use: Never used  ?Substance and Sexual Activity  ? Alcohol use: Yes  ?  Alcohol/week: 1.0 standard drink  ?  Types: 1 Glasses of wine per week  ?  Comment: 2 a month   ? Drug use: No  ? Sexual activity: Yes  ?  Birth  control/protection: Surgical  ?Other Topics Concern  ? Not on file  ?Social History Narrative  ? Lives with spouse  ? 2 caffeine drinks daily   ? ?Social Determinants of Health  ? ?Financial Resource Strain: Not on file  ?Food Insecurity: Not on file  ?Transportation Needs: Not on file  ?Physical Activity: Not on file  ?Stress: Not on file  ?Social Connections: Not on file  ? ? ?Family History  ?Problem Relation Age of Onset  ? Leukemia Mother   ? Diabetes Mother   ? Breast cancer Mother   ? Hypertension Mother   ? Stroke Mother   ? Esophageal cancer Brother   ? Diabetes Maternal Aunt   ? Diabetes Maternal Uncle   ? Diabetes Maternal Grandfather   ? Heart attack Father   ? Heart failure Father   ? Colon cancer Neg Hx   ? ? ?  Outpatient Encounter Medications as of 02/09/2022  ?Medication Sig  ? calcium carbonate (TUMS - DOSED IN MG ELEMENTAL CALCIUM) 500 MG chewable tablet Chew 1 tablet by mouth daily as needed for indigestion or heartburn.  ? acetaminophen (TYLENOL) 500 MG tablet Take 1,000 mg by mouth 2 (two) times daily as needed for headache.  ? B-D ULTRAFINE III SHORT PEN 31G X 8 MM MISC USE EVERY DAY  ? Blood Glucose Monitoring Suppl (ONETOUCH VERIO) w/Device KIT 1 each by Does not apply route as needed.  ? cholecalciferol (VITAMIN D3) 25 MCG (1000 UNIT) tablet Take 2,000 Units by mouth daily.  ? dicyclomine (BENTYL) 10 MG capsule Take 10 mg by mouth 2 (two) times daily.   ? glipiZIDE (GLUCOTROL) 5 MG tablet TAKE 1 TABLET(5 MG) BY MOUTH TWICE DAILY  ? glucose blood (ONETOUCH VERIO) test strip Use to test blood glucose 4 times daily.  ? ibuprofen (ADVIL,MOTRIN) 200 MG tablet Take 600 mg by mouth 2 (two) times daily as needed for moderate pain.  ? insulin degludec (TRESIBA FLEXTOUCH) 100 UNIT/ML FlexTouch Pen ADMINISTER 60 UNITS UNDER THE SKIN AT BEDTIME  ? lidocaine (XYLOCAINE) 2 % solution Use as directed 15 mLs in the mouth or throat as needed for mouth pain.  ? losartan-hydrochlorothiazide (HYZAAR) 100-25 MG  tablet Take 1 tablet by mouth daily.  ? nitrofurantoin, macrocrystal-monohydrate, (MACROBID) 100 MG capsule Take 1 capsule (100 mg total) by mouth 2 (two) times daily.  ? OneTouch Delica Lancets 01U MISC Use to check blood glucose 4 times daily.  ? polyethylene glycol (MIRALAX) 17 g packet Take 17 g by mouth daily as needed for moderate constipation.  ? rosuvastatin (CRESTOR) 10 MG tablet TAKE 1 TABLET(10 MG) BY MOUTH DAILY  ? tolterodine (DETROL) 2 MG tablet Take 2 mg by mouth 2 (two) times daily.  ? [DISCONTINUED] Continuous Blood Gluc Receiver (FREESTYLE LIBRE 2 READER) DEVI As directed  ? [DISCONTINUED] Continuous Blood Gluc Sensor (FREESTYLE LIBRE 2 SENSOR) MISC 1 Piece by Does not apply route every 14 (fourteen) days.  ? [DISCONTINUED] glipiZIDE (GLUCOTROL) 5 MG tablet TAKE 1 TABLET(5 MG) BY MOUTH TWICE DAILY  ? [DISCONTINUED] TRESIBA FLEXTOUCH 100 UNIT/ML FlexTouch Pen ADMINISTER 60 UNITS UNDER THE SKIN AT BEDTIME  ? ?No facility-administered encounter medications on file as of 02/09/2022.  ? ? ?ALLERGIES: ?Allergies  ?Allergen Reactions  ? Hydromorphone Hcl Nausea And Vomiting  ? Sulfa Antibiotics Nausea And Vomiting  ? Cephalexin Hives  ? Ciprocin-Fluocin-Procin [Fluocinolone Acetonide] Hives  ? Bee Venom Swelling  ?  REACTION: Severe swelling  ? Levofloxacin Hives  ? Celebrex [Celecoxib] Rash  ?  itching  ? Penicillins Rash  ?  Did it involve swelling of the face/tongue/throat, SOB, or low BP? No ?Did it involve sudden or severe rash/hives, skin peeling, or any reaction on the inside of your mouth or nose? Yes ?Did you need to seek medical attention at a hospital or doctor's office? Unknown ?When did it last happen? childhood       ?If all above answers are ?NO?, may proceed with cephalosporin use. ?  ? ? ?VACCINATION STATUS: ? ?There is no immunization history on file for this patient. ? ?Diabetes ?She presents for her follow-up diabetic visit. She has type 2 diabetes mellitus. Her disease course has been  stable. There are no hypoglycemic associated symptoms. Pertinent negatives for hypoglycemia include no confusion, headaches, pallor or seizures. Pertinent negatives for diabetes include no chest pain, no fatigue, no polydipsia, no polyphagia and no polyuria.  There are no hypoglycemic complications. Symptoms are stable. There are no diabetic complications. Risk factors for coronary artery disease include dyslipidemia, diabetes mellitus, family history, hypertension, obesity, post-menopausal and sedentary lifestyle. Current diabetic treatment includes insulin injections and oral agent (monotherapy). Her weight is increasing steadily. She is following a generally unhealthy diet. When asked about meal planning, she reported none. She has not had a previous visit with a dietitian. She participates in exercise intermittently. Her home blood glucose trend is decreasing steadily. Her breakfast blood glucose range is generally 110-130 mg/dl. Her bedtime blood glucose range is generally 130-140 mg/dl. Her overall blood glucose range is 130-140 mg/dl. (She presents with continued improvement in her glycemic profile averaging 125-140 -140 mg per DL.  Her point-of-care A1c is unchanged at 6.5%.  She did not document any hypoglycemia.  Her A1c has improved from 11.4% since last year. ?) An ACE inhibitor/angiotensin II receptor blocker is being taken. Eye exam is current.  ?Hypertension ?This is a chronic problem. The current episode started more than 1 year ago. The problem is controlled. Pertinent negatives include no chest pain, headaches, palpitations or shortness of breath. Risk factors for coronary artery disease include diabetes mellitus, dyslipidemia, family history, obesity, post-menopausal state and sedentary lifestyle. Past treatments include angiotensin blockers.  ?Hyperlipidemia ?This is a chronic problem. The current episode started more than 1 year ago. The problem is uncontrolled. Pertinent negatives include no  chest pain, myalgias or shortness of breath. She is currently on no antihyperlipidemic treatment. Risk factors for coronary artery disease include diabetes mellitus, dyslipidemia, family history, hypertensi

## 2022-02-24 ENCOUNTER — Other Ambulatory Visit: Payer: Self-pay | Admitting: "Endocrinology

## 2022-03-04 DIAGNOSIS — G4733 Obstructive sleep apnea (adult) (pediatric): Secondary | ICD-10-CM | POA: Diagnosis not present

## 2022-03-31 IMAGING — US US BREAST*L* LIMITED INC AXILLA
1 series · 6 of 6 positions shown · non-contrast
Comparison: Prior exams

CLINICAL DATA: Screening recall for a possible left breast
asymmetry.

EXAM:
DIGITAL DIAGNOSTIC LEFT MAMMOGRAM WITH CAD AND TOMO
ULTRASOUND LEFT BREAST

[Series 1: us breast*left* limited inc axilla · 0.09mm/px · 6 of 6 slices shown]
[im 1/6]
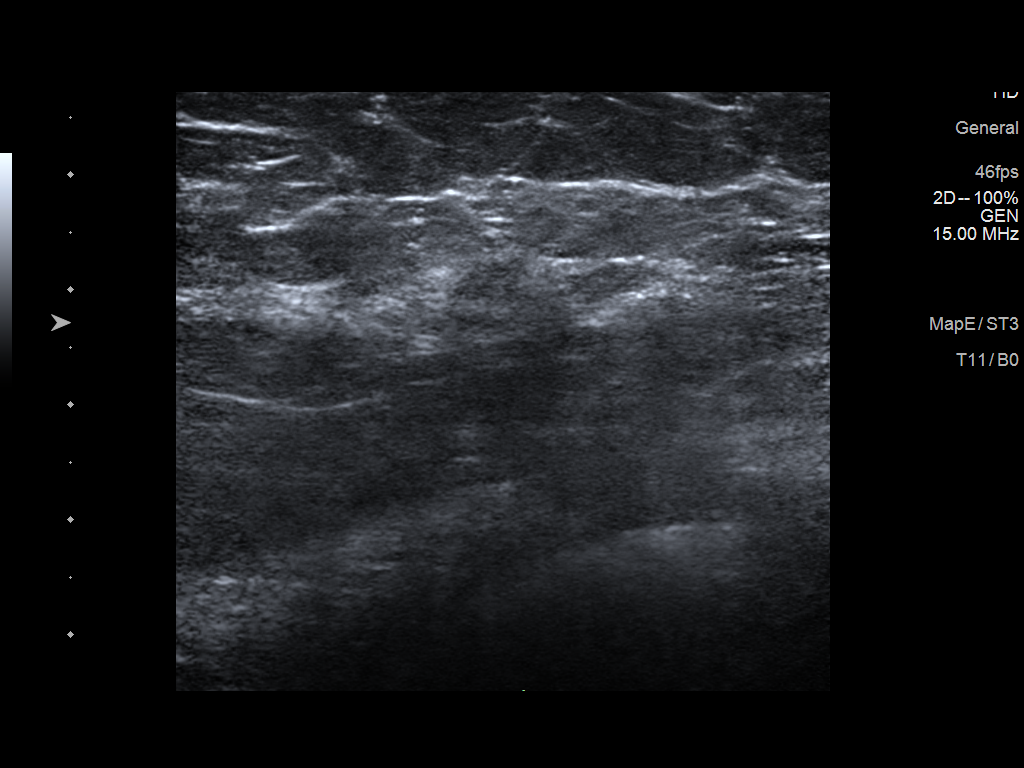
[im 2/6]
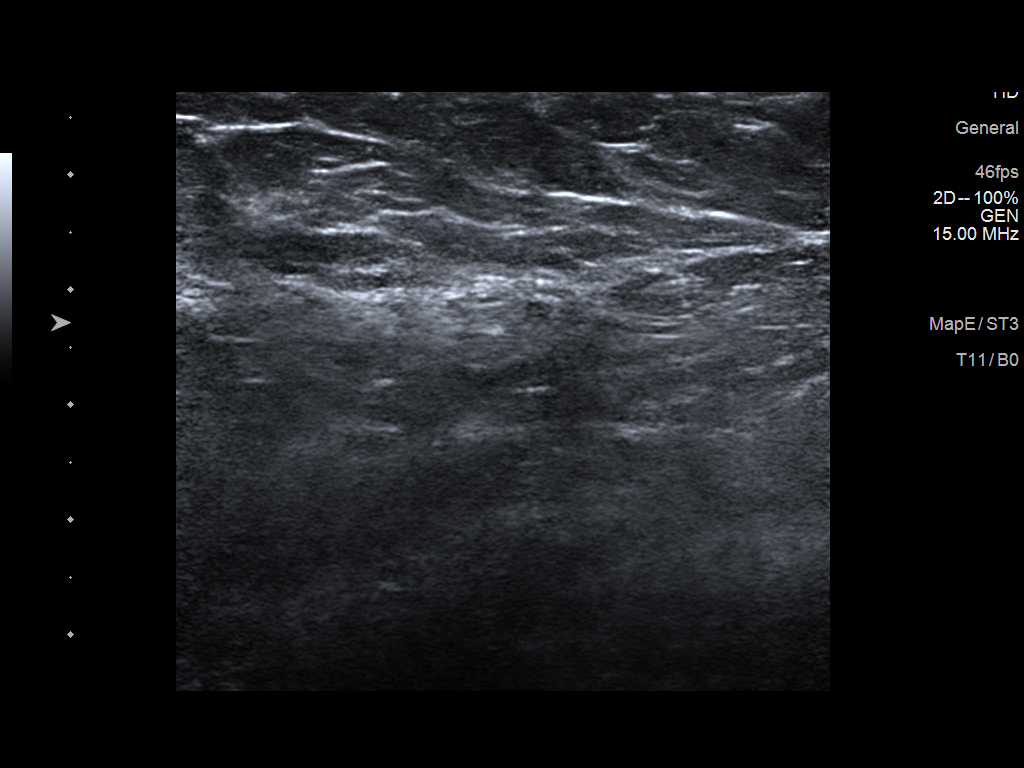
[im 3/6]
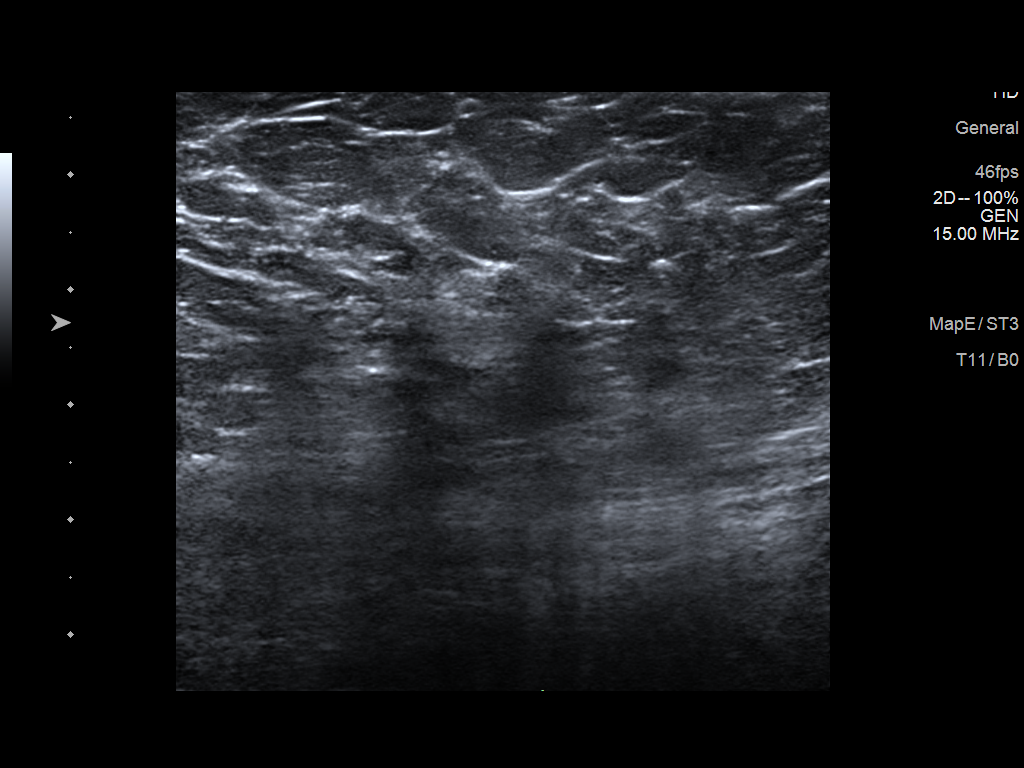
[im 4/6]
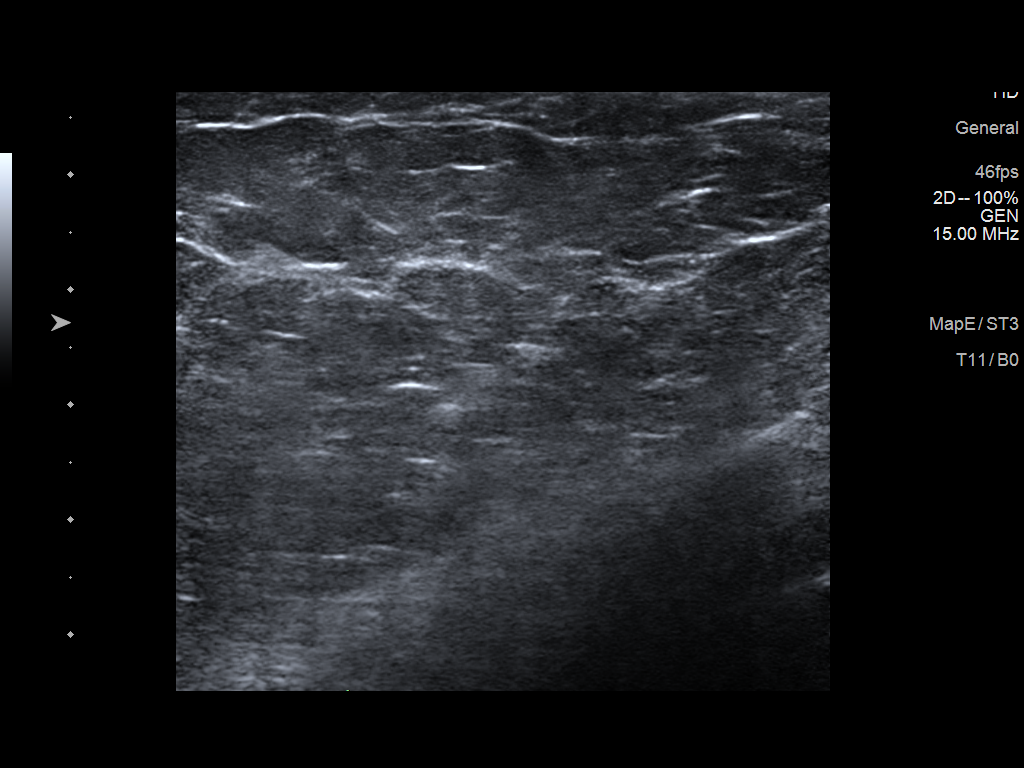
[im 5/6]
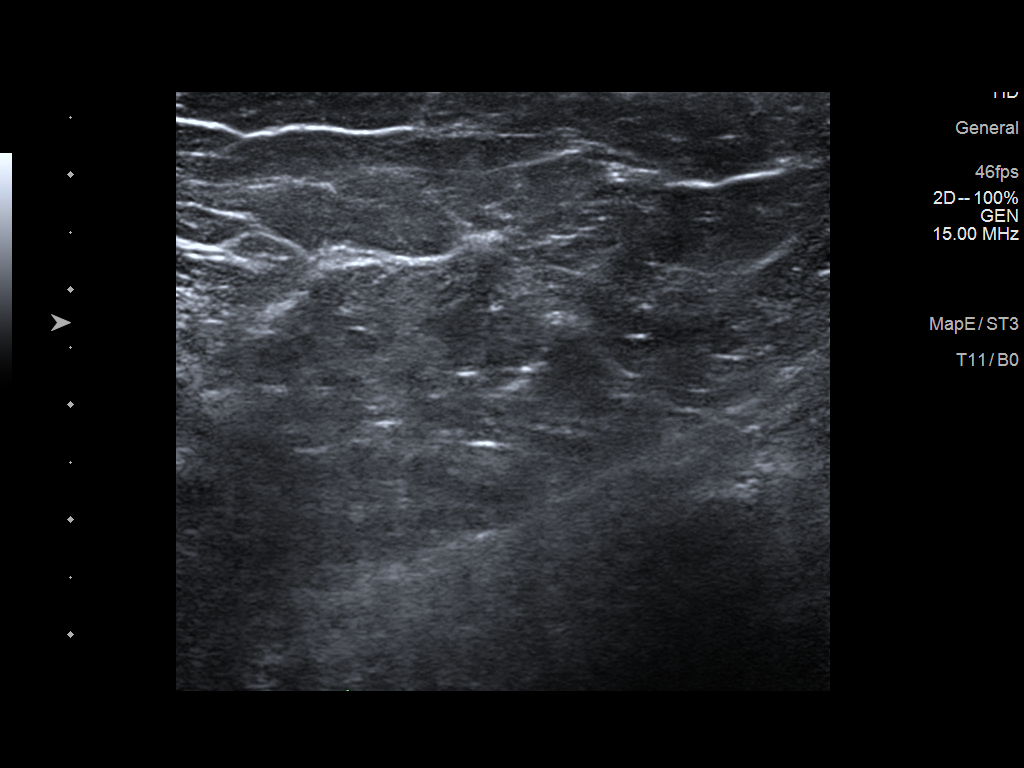
[im 6/6]
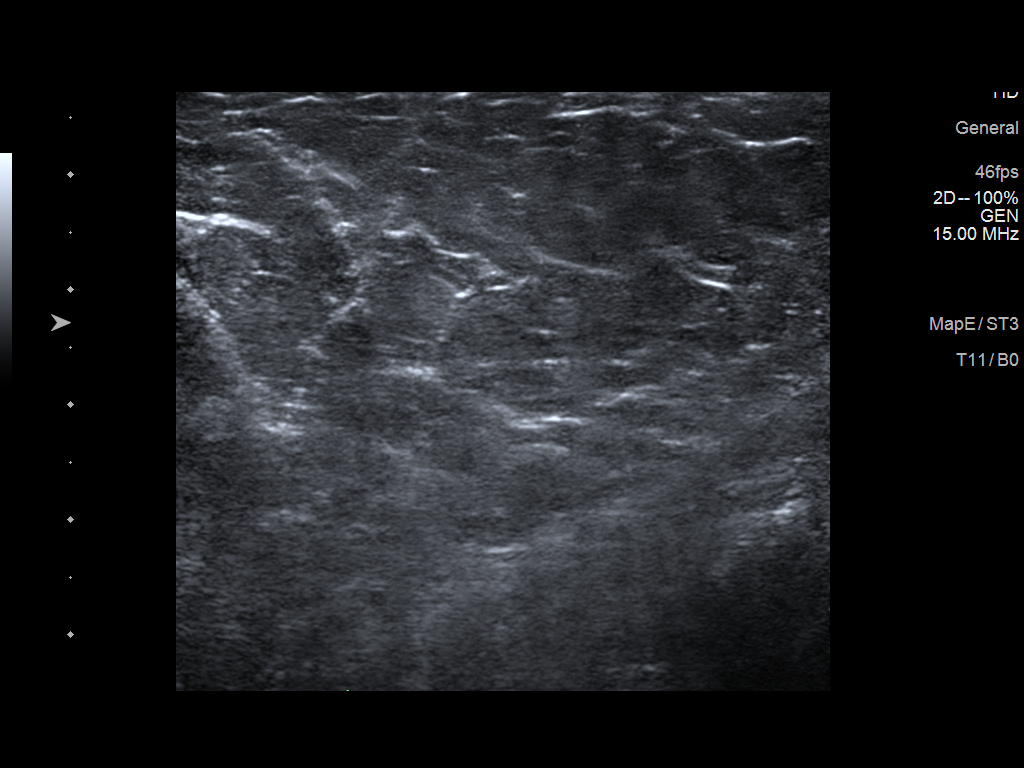

[6 of 6 positions shown; findings below may reference images not displayed]

ACR Breast Density Category b: There are scattered areas of
fibroglandular density.
FINDINGS: The possible asymmetry, noted in the posterior, inferior aspect of
the left breast on the screening MLO view, mostly disperses with
spot compression imaging consistent with normal fibroglandular
tissue. On the spot-compression MLO images and the mL images, the
appearance is similar to multiple prior exams also consistent with
normal fibroglandular tissue. There is no defined mass, no area of
architectural distortion and there are no suspicious calcifications.

Mammographic images were processed with CAD.

Targeted ultrasound is performed, showing normal fibroglandular
tissue throughout the lower outer left breast. No mass or suspicious
lesion.
IMPRESSION: Negative exam.  No evidence of breast malignancy.

RECOMMENDATION:
Screening mammogram in one year.(Code:45-2-NEX)

I have discussed the findings and recommendations with the patient.
If applicable, a reminder letter will be sent to the patient
regarding the next appointment.

BI-RADS CATEGORY  1: Negative.

## 2022-04-04 DIAGNOSIS — G4733 Obstructive sleep apnea (adult) (pediatric): Secondary | ICD-10-CM | POA: Diagnosis not present

## 2022-06-14 ENCOUNTER — Ambulatory Visit: Payer: BC Managed Care – PPO | Admitting: "Endocrinology

## 2022-06-17 ENCOUNTER — Other Ambulatory Visit: Payer: Self-pay | Admitting: "Endocrinology

## 2022-06-17 DIAGNOSIS — E1165 Type 2 diabetes mellitus with hyperglycemia: Secondary | ICD-10-CM

## 2022-06-18 LAB — COMPREHENSIVE METABOLIC PANEL
ALT: 14 IU/L (ref 0–32)
AST: 14 IU/L (ref 0–40)
Albumin/Globulin Ratio: 1.8 (ref 1.2–2.2)
Albumin: 4.4 g/dL (ref 3.8–4.9)
Alkaline Phosphatase: 74 IU/L (ref 44–121)
BUN/Creatinine Ratio: 17 (ref 9–23)
BUN: 15 mg/dL (ref 6–24)
Bilirubin Total: 0.5 mg/dL (ref 0.0–1.2)
CO2: 24 mmol/L (ref 20–29)
Calcium: 9.6 mg/dL (ref 8.7–10.2)
Chloride: 101 mmol/L (ref 96–106)
Creatinine, Ser: 0.86 mg/dL (ref 0.57–1.00)
Globulin, Total: 2.5 g/dL (ref 1.5–4.5)
Glucose: 128 mg/dL — ABNORMAL HIGH (ref 70–99)
Potassium: 4.1 mmol/L (ref 3.5–5.2)
Sodium: 140 mmol/L (ref 134–144)
Total Protein: 6.9 g/dL (ref 6.0–8.5)
eGFR: 79 mL/min/{1.73_m2} (ref >59)

## 2022-06-18 LAB — LIPID PANEL
Chol/HDL Ratio: 3.4 ratio (ref 0.0–4.4)
Cholesterol, Total: 132 mg/dL (ref 100–199)
HDL: 39 mg/dL — ABNORMAL LOW (ref >39)
LDL Chol Calc (NIH): 70 mg/dL (ref 0–99)
Triglycerides: 127 mg/dL (ref 0–149)
VLDL Cholesterol Cal: 23 mg/dL (ref 5–40)

## 2022-06-18 LAB — TSH: TSH: 3.65 u[IU]/mL (ref 0.450–4.500)

## 2022-06-18 LAB — T4, FREE: Free T4: 0.97 ng/dL (ref 0.82–1.77)

## 2022-06-21 ENCOUNTER — Ambulatory Visit (INDEPENDENT_AMBULATORY_CARE_PROVIDER_SITE_OTHER): Payer: Managed Care, Other (non HMO) | Admitting: "Endocrinology

## 2022-06-21 ENCOUNTER — Encounter: Payer: Self-pay | Admitting: "Endocrinology

## 2022-06-21 VITALS — BP 114/74 | HR 64 | Ht 64.0 in | Wt 216.8 lb

## 2022-06-21 DIAGNOSIS — I10 Essential (primary) hypertension: Secondary | ICD-10-CM | POA: Diagnosis not present

## 2022-06-21 DIAGNOSIS — E1165 Type 2 diabetes mellitus with hyperglycemia: Secondary | ICD-10-CM

## 2022-06-21 DIAGNOSIS — E782 Mixed hyperlipidemia: Secondary | ICD-10-CM

## 2022-06-21 DIAGNOSIS — Z6837 Body mass index (BMI) 37.0-37.9, adult: Secondary | ICD-10-CM

## 2022-06-21 DIAGNOSIS — E559 Vitamin D deficiency, unspecified: Secondary | ICD-10-CM | POA: Diagnosis not present

## 2022-06-21 LAB — POCT GLYCOSYLATED HEMOGLOBIN (HGB A1C): HbA1c, POC (controlled diabetic range): 6.7 % (ref 0.0–7.0)

## 2022-06-21 NOTE — Patient Instructions (Signed)
                                     Advice for Weight Management  -For most of us the best way to lose weight is by diet management. Generally speaking, diet management means consuming less calories intentionally which over time brings about progressive weight loss.  This can be achieved more effectively by avoiding ultra processed carbohydrates, processed meats, unhealthy fats.    It is critically important to know your numbers: how much calorie you are consuming and how much calorie you need. More importantly, our carbohydrates sources should be unprocessed naturally occurring  complex starch food items.  It is always important to balance nutrition also by  appropriate intake of proteins (mainly plant-based), healthy fats/oils, plenty of fruits and vegetables.   -The American College of Lifestyle Medicine (ACL M) recommends nutrition derived mostly from Whole Food, Plant Predominant Sources example an apple instead of applesauce or apple pie. Eat Plenty of vegetables, Mushrooms, fruits, Legumes, Whole Grains, Nuts, seeds in lieu of processed meats, processed snacks/pastries red meat, poultry, eggs.  Use only water or unsweetened tea for hydration.  The College also recommends the need to stay away from risky substances including alcohol, smoking; obtaining 7-9 hours of restorative sleep, at least 150 minutes of moderate intensity exercise weekly, importance of healthy social connections, and being mindful of stress and seek help when it is overwhelming.    -Sticking to a routine mealtime to eat 3 meals a day and avoiding unnecessary snacks is shown to have a big role in weight control. Under normal circumstances, the only time we burn stored energy is when we are hungry, so allow  some hunger to take place- hunger means no food between appropriate meal times, only water.  It is not advisable to starve.   -It is better to avoid simple carbohydrates including:  Cakes, Sweet Desserts, Ice Cream, Soda (diet and regular), Sweet Tea, Candies, Chips, Cookies, Store Bought Juices, Alcohol in Excess of  1-2 drinks a day, Lemonade,  Artificial Sweeteners, Doughnuts, Coffee Creamers, "Sugar-free" Products, etc, etc.  This is not a complete list.....    -Consulting with certified diabetes educators is proven to provide you with the most accurate and current information on diet.  Also, you may be  interested in discussing diet options/exchanges , we can schedule a visit with Victoria Williams, RDN, CDE for individualized nutrition education.  -Exercise: If you are able: 30 -60 minutes a day ,4 days a week, or 150 minutes of moderate intensity exercise weekly.    The longer the better if tolerated.  Combine stretch, strength, and aerobic activities.  If you were told in the past that you have high risk for cardiovascular diseases, or if you are currently symptomatic, you may seek evaluation by your heart doctor prior to initiating moderate to intense exercise programs.                                  Additional Care Considerations for Diabetes/Prediabetes   -Diabetes  is a chronic disease.  The most important care consideration is regular follow-up with your diabetes care provider with the goal being avoiding or delaying its complications and to take advantage of advances in medications and technology.  If appropriate actions are taken early enough, type 2 diabetes can even be   reversed.  Seek information from the right source.  - Whole Food, Plant Predominant Nutrition is highly recommended: Eat Plenty of vegetables, Mushrooms, fruits, Legumes, Whole Grains, Nuts, seeds in lieu of processed meats, processed snacks/pastries red meat, poultry, eggs as recommended by American College of  Lifestyle Medicine (ACLM).  -Type 2 diabetes is known to coexist with other important comorbidities such as high blood pressure and high cholesterol.  It is critical to control not only the  diabetes but also the high blood pressure and high cholesterol to minimize and delay the risk of complications including coronary artery disease, stroke, amputations, blindness, etc.  The good news is that this diet recommendation for type 2 diabetes is also very helpful for managing high cholesterol and high blood blood pressure.  - Studies showed that people with diabetes will benefit from a class of medications known as ACE inhibitors and statins.  Unless there are specific reasons not to be on these medications, the standard of care is to consider getting one from these groups of medications at an optimal doses.  These medications are generally considered safe and proven to help protect the heart and the kidneys.    - People with diabetes are encouraged to initiate and maintain regular follow-up with eye doctors, foot doctors, dentists , and if necessary heart and kidney doctors.     - It is highly recommended that people with diabetes quit smoking or stay away from smoking, and get yearly  flu vaccine and pneumonia vaccine at least every 5 years.  See above for additional recommendations on exercise, sleep, stress management , and healthy social connections.      

## 2022-06-21 NOTE — Progress Notes (Signed)
06/21/2022, 6:22 PM   Endocrinology follow-up note  Subjective:    Patient ID: Victoria Williams, female    DOB: 1966-09-09.  Victoria Williams is being seen in follow-up after she was seen in consultation for management of currently uncontrolled symptomatic diabetes requested by  Sharilyn Sites, MD.   Past Medical History:  Diagnosis Date   Arthritis    Bell's palsy    approx 2015   Chronic kidney disease    Diabetes mellitus    only when on steriods   Esophagitis 2012   EGD   Family history of adverse reaction to anesthesia    PONV and hallucinations-MOM   Henoch-Schonlein purpura ([place])    History of kidney stones    History of UTI    Hypertension    IBS (irritable bowel syndrome)    Obesity    Sleep apnea     Past Surgical History:  Procedure Laterality Date   ABDOMINAL HYSTERECTOMY  2003   ACHILLES TENDON SURGERY Left    CESAREAN SECTION     CHOLECYSTECTOMY N/A 01/03/2020   Procedure: LAPAROSCOPIC CHOLECYSTECTOMY;  Surgeon: Virl Cagey, MD;  Location: [facility];  Service: General;  Laterality: N/A;   KNEE SURGERY Left    arthroscopic   NECK SURGERY  2000   cervical fusion 6/7   REPLACEMENT TOTAL KNEE Left 2019   [facility]   TONSILECTOMY, ADENOIDECTOMY, BILATERAL MYRINGOTOMY AND TUBES     TONSILLECTOMY     TUBAL LIGATION  1996    Social History   Socioeconomic History   Marital status: Married    Spouse name: Not on file   Number of children: 3   Years of education: some college   Highest education level: Not on file  Occupational History    Comment: [employer], loan processor  Tobacco Use   Smoking status: Never   Smokeless tobacco: Never  Vaping Use   Vaping Use: Never used  Substance and Sexual Activity   Alcohol use: Yes    Alcohol/week: 1.0 standard drink of alcohol    Types: 1 Glasses of wine per week    Comment: 2 a month    Drug use: No   Sexual activity: Yes     Birth control/protection: Surgical  Other Topics Concern   Not on file  Social History Narrative   Lives with spouse   2 caffeine drinks daily    Social Determinants of Health   Financial Resource Strain: Not on file  Food Insecurity: Not on file  Transportation Needs: Not on file  Physical Activity: Not on file  Stress: Not on file  Social Connections: Not on file    Family History  Problem Relation Age of Onset   Leukemia Mother    Diabetes Mother    Breast cancer Mother    Hypertension Mother    Stroke Mother    Esophageal cancer Brother    Diabetes Maternal Aunt    Diabetes Maternal Uncle    Diabetes Maternal Grandfather    Heart attack Father    Heart failure Father    Colon cancer Neg  Hx     Outpatient Encounter Medications as of 06/21/2022  Medication Sig   acetaminophen (TYLENOL) 500 MG tablet Take 1,000 mg by mouth 2 (two) times daily as needed for headache.   B-D ULTRAFINE III SHORT PEN 31G X 8 MM MISC USE EVERY DAY   Blood Glucose Monitoring Suppl (ONETOUCH VERIO) w/Device KIT 1 each by Does not apply route as needed.   calcium carbonate (TUMS - DOSED IN MG ELEMENTAL CALCIUM) 500 MG chewable tablet Chew 1 tablet by mouth daily as needed for indigestion or heartburn.   cholecalciferol (VITAMIN D3) 25 MCG (1000 UNIT) tablet Take 2,000 Units by mouth daily.   dicyclomine (BENTYL) 10 MG capsule Take 10 mg by mouth 2 (two) times daily.    glipiZIDE (GLUCOTROL) 5 MG tablet TAKE 1 TABLET(5 MG) BY MOUTH TWICE DAILY   glucose blood (ONETOUCH VERIO) test strip Use to test blood glucose 4 times daily.   ibuprofen (ADVIL,MOTRIN) 200 MG tablet Take 600 mg by mouth 2 (two) times daily as needed for moderate pain.   lidocaine (XYLOCAINE) 2 % solution Use as directed 15 mLs in the mouth or throat as needed for mouth pain.   losartan-hydrochlorothiazide (HYZAAR) 100-25 MG tablet Take 1 tablet by mouth daily.   nitrofurantoin, macrocrystal-monohydrate, (MACROBID) 100 MG  capsule Take 1 capsule (100 mg total) by mouth 2 (two) times daily.   OneTouch Delica Lancets 59D MISC Use to check blood glucose 4 times daily.   polyethylene glycol (MIRALAX) 17 g packet Take 17 g by mouth daily as needed for moderate constipation.   rosuvastatin (CRESTOR) 10 MG tablet TAKE 1 TABLET(10 MG) BY MOUTH DAILY   tolterodine (DETROL) 2 MG tablet Take 2 mg by mouth 2 (two) times daily.   TRESIBA FLEXTOUCH 100 UNIT/ML FlexTouch Pen ADMINISTER 60 UNITS UNDER THE SKIN AT BEDTIME   No facility-administered encounter medications on file as of 06/21/2022.    ALLERGIES: Allergies  Allergen Reactions   Hydromorphone Hcl Nausea And Vomiting   Sulfa Antibiotics Nausea And Vomiting   Cephalexin Hives   Ciprocin-Fluocin-Procin [Fluocinolone Acetonide] Hives   Bee Venom Swelling    REACTION: Severe swelling   Levofloxacin Hives   Celebrex [Celecoxib] Rash    itching   Penicillins Rash    Did it involve swelling of the face/tongue/throat, SOB, or low BP? No Did it involve sudden or severe rash/hives, skin peeling, or any reaction on the inside of your mouth or nose? Yes Did you need to seek medical attention at a hospital or doctor's office? Unknown When did it last happen? childhood       If all above answers are "NO", may proceed with cephalosporin use.     VACCINATION STATUS:  There is no immunization history on file for this patient.  Diabetes She presents for her follow-up diabetic visit. She has type 2 diabetes mellitus. Her disease course has been stable. There are no hypoglycemic associated symptoms. Pertinent negatives for hypoglycemia include no confusion, headaches, pallor or seizures. Pertinent negatives for diabetes include no chest pain, no fatigue, no polydipsia, no polyphagia and no polyuria. There are no hypoglycemic complications. Symptoms are stable. There are no diabetic complications. Risk factors for coronary artery disease include dyslipidemia, diabetes  mellitus, family history, hypertension, obesity, post-menopausal and sedentary lifestyle. Current diabetic treatment includes insulin injections and oral agent (monotherapy). Her weight is fluctuating minimally. She is following a generally unhealthy diet. When asked about meal planning, she reported none. She has not had a previous  visit with a dietitian. She participates in exercise intermittently. Her home blood glucose trend is fluctuating minimally. Her breakfast blood glucose range is generally 110-130 mg/dl. Her bedtime blood glucose range is generally 130-140 mg/dl. Her overall blood glucose range is 130-140 mg/dl. (She presents with stable glycemic profile averaging 125-165 mg.    Her point-of-care A1c is 6.7%.   She did not document any hypoglycemia.  Her A1c has improved from 11.4% since last year. ) An ACE inhibitor/angiotensin II receptor blocker is being taken. Eye exam is current.  Hypertension This is a chronic problem. The current episode started more than 1 year ago. The problem is controlled. Pertinent negatives include no chest pain, headaches, palpitations or shortness of breath. Risk factors for coronary artery disease include diabetes mellitus, dyslipidemia, family history, obesity, post-menopausal state and sedentary lifestyle. Past treatments include angiotensin blockers.  Hyperlipidemia This is a chronic problem. The current episode started more than 1 year ago. The problem is uncontrolled. Pertinent negatives include no chest pain, myalgias or shortness of breath. She is currently on no antihyperlipidemic treatment. Risk factors for coronary artery disease include diabetes mellitus, dyslipidemia, family history, hypertension, obesity, a sedentary lifestyle and post-menopausal.     Review of Systems  Constitutional:  Negative for chills, fatigue, fever and unexpected weight change.  HENT:  Negative for trouble swallowing and voice change.   Eyes:  Negative for visual  disturbance.  Respiratory:  Negative for cough, shortness of breath and wheezing.   Cardiovascular:  Negative for chest pain, palpitations and leg swelling.  Gastrointestinal:  Negative for diarrhea, nausea and vomiting.  Endocrine: Negative for cold intolerance, heat intolerance, polydipsia, polyphagia and polyuria.  Musculoskeletal:  Negative for arthralgias and myalgias.  Skin:  Negative for color change, pallor, rash and wound.  Neurological:  Negative for seizures and headaches.  Psychiatric/Behavioral:  Negative for confusion and suicidal ideas.     Objective:       06/21/2022   10:51 AM 02/09/2022    3:01 PM 12/09/2021    6:17 PM  Vitals with BMI  Height 5' 4"  5' 4"  5' 3"   Weight 216 lbs 13 oz 217 lbs 3 oz 210 lbs  BMI 37.2 63.78 58.85  Systolic 027 98   Diastolic 74 64   Pulse 64 60     BP 114/74   Pulse 64   Ht 5' 4"  (1.626 m)   Wt 216 lb 12.8 oz (98.3 kg)   BMI 37.21 kg/m   Wt Readings from Last 3 Encounters:  06/21/22 216 lb 12.8 oz (98.3 kg)  02/09/22 217 lb 3.2 oz (98.5 kg)  12/09/21 210 lb (95.3 kg)       CMP ( most recent) CMP     Component Value Date/Time   NA 140 06/17/2022 0921   K 4.1 06/17/2022 0921   CL 101 06/17/2022 0921   CO2 24 06/17/2022 0921   GLUCOSE 128 (H) 06/17/2022 0921   GLUCOSE 300 (H) 05/20/2020 1521   BUN 15 06/17/2022 0921   CREATININE 0.86 06/17/2022 0921   CALCIUM 9.6 06/17/2022 0921   PROT 6.9 06/17/2022 0921   ALBUMIN 4.4 06/17/2022 0921   AST 14 06/17/2022 0921   ALT 14 06/17/2022 0921   ALKPHOS 74 06/17/2022 0921   BILITOT 0.5 06/17/2022 0921   GFRNONAA 99 11/23/2020 1353   GFRAA 114 11/23/2020 1353     Diabetic Labs (most recent): Lab Results  Component Value Date   HGBA1C 6.7 06/21/2022   HGBA1C 6.5 02/09/2022  HGBA1C 6.5 09/15/2021   MICROALBUR 80 11/26/2020     Lipid Panel ( most recent) Lipid Panel     Component Value Date/Time   CHOL 132 06/17/2022 0921   TRIG 127 06/17/2022 0921   HDL 39  (L) 06/17/2022 0921   CHOLHDL 3.4 06/17/2022 0921   CHOLHDL 5.7 01/27/2014 0556   VLDL 24 01/27/2014 0556   LDLCALC 70 06/17/2022 0921   LABVLDL 23 06/17/2022 0921      Lab Results  Component Value Date   TSH 3.650 06/17/2022   TSH 2.640 11/23/2020   TSH 1.738 11/27/2010   FREET4 0.97 06/17/2022   FREET4 1.07 11/23/2020      Assessment & Plan:   1. Uncontrolled type 2 diabetes mellitus with hyperglycemia (HCC) - [place] has currently uncontrolled symptomatic type 2 DM since  55 years of age.  She presents with stable glycemic profile averaging 125-165 mg.    Her point-of-care A1c is 6.7%.   She did not document any hypoglycemia.  Her A1c has improved from 11.4% since last year.    Recent labs reviewed. - I had a long discussion with her about the progressive nature of diabetes and the pathology behind its complications. -She did not report gross complications from her diabetes, however she remains at a high risk for more acute and chronic complications which include CAD, CVA, CKD, retinopathy, and neuropathy. These are all discussed in detail with her.  - I have counseled her on diet  and weight management  by adopting a carbohydrate restricted/protein rich diet. Patient is encouraged to switch to  unprocessed or minimally processed     complex starch and increased protein intake (animal or plant source), fruits, and vegetables. -  she is advised to stick to a routine mealtimes to eat 3 meals  a day and avoid unnecessary snacks ( to snack only to correct hypoglycemia).   - she acknowledges that there is a room for improvement in her food and drink choices. - Suggestion is made for her to avoid simple carbohydrates  from her diet including Cakes, Sweet Desserts, Ice Cream, Soda (diet and regular), Sweet Tea, Candies, Chips, Cookies, Store Bought Juices, Alcohol , Artificial Sweeteners,  Coffee Creamer, and "Sugar-free" Products, Lemonade. This will help patient to have more  stable blood glucose profile and potentially avoid unintended weight gain.  The following Lifestyle Medicine recommendations according to [place]) were discussed and and offered to patient and she  agrees to start the journey:  A. Whole Foods, Plant-Based Nutrition comprising of fruits and vegetables, plant-based proteins, whole-grain carbohydrates was discussed in detail with the patient.   A list for source of those nutrients were also provided to the patient.  Patient will use only water or unsweetened tea for hydration. B.  The need to stay away from risky substances including alcohol, smoking; obtaining 7 to 9 hours of restorative sleep, at least 150 minutes of moderate intensity exercise weekly, the importance of healthy social connections,  and stress management techniques were discussed. C.  A full color page of  Calorie density of various food groups per pound showing examples of each food groups was provided to the patient.    - she has been scheduled with Jearld Fenton, RDN, CDE for diabetes education.  - I have approached her with the following individualized plan to manage  her diabetes and patient agrees:   -In light of her presentation with near target glycemic profile,  she will not need prandial insulin for now.  She is advised to continue Tresiba 60 units nightly, associated with monitoring of blood glucose twice a day-daily before breakfast and at bedtime.   -She did not want to get on a CGM. -- she is encouraged to call clinic for blood glucose levels less than 70 or above 200 mg /dl. -Whole Foods plant predominant lifestyle nutrition was discussed and recommended to her. - she is advised to continue glipizide 5 mg p.o. twice daily with breakfast and supper.    - Specific targets for  A1c;  LDL, HDL,  and Triglycerides were discussed with the patient.  2) Blood Pressure /Hypertension:  -Her blood pressure is controlled to target.  She  has a slightly above target urinary microalbumin.  she is advised to continue her current medications including losartan/HCTZ 100/25 mg p.o. daily with breakfast .   3) Lipids/Hyperlipidemia: Review of her previsit labs show improved LDL of 69, and last measured triglycerides were 164.  She is advised to continue Crestor 10 mg p.o. nightly.  .  The above mentioned whole food plant-based diet plan will also help with dyslipidemia.     4)  Weight/Diet:  Body mass index is 37.21 kg/m.  -   clearly complicating her diabetes care.   she is  a candidate for weight loss. I discussed with her the fact that loss of 5 - 10% of her  current body weight will have the most impact on her diabetes management.  Exercise, and detailed carbohydrates information provided  -  detailed on discharge instructions.  5) Chronic Care/Health Maintenance:  -she  ison ACEI/ARB and Statin medications and  is encouraged to initiate and continue to follow up with Ophthalmology, Dentist,  Podiatrist at least yearly or according to recommendations, and advised to  stay away from smoking. I have recommended yearly flu vaccine and pneumonia vaccine at least every 5 years; moderate intensity exercise for up to 150 minutes weekly; and  sleep for at least 7 hours a day.  Her screening ABI was negative for PAD in February 2022.  This study will be repeated in February 2027, or sooner if needed.   - she is  advised to maintain close follow up with Sharilyn Sites, MD for primary care needs, as well as her other providers for optimal and coordinated care.   I spent 42 minutes in the care of the patient today including review of labs from [facility], Lipids, Thyroid Function, Hematology (current and previous including abstractions from other facilities); face-to-face time discussing  her blood glucose readings/logs, discussing hypoglycemia and hyperglycemia episodes and symptoms, medications doses, her options of short and long term treatment based  on the latest standards of care / guidelines;  discussion about incorporating lifestyle medicine;  and documenting the encounter. Risk reduction counseling performed per USPSTF guidelines to reduce  obesity and cardiovascular risk factors.     Please refer to Patient Instructions for Blood Glucose Monitoring and Insulin/Medications Dosing Guide"  in media tab for additional information. Please  also refer to " Patient Self Inventory" in the Media  tab for reviewed elements of pertinent patient history.  Victoria Williams participated in the discussions, expressed understanding, and voiced agreement with the above plans.  All questions were answered to her satisfaction. she is encouraged to contact clinic should she have any questions or concerns prior to her return visit.   Follow up plan: - Return in about 3 months (around 09/21/2022) for Bring Meter/CGM  Device/Logs- A1c in Office.  Glade Lloyd, MD [facility] [address] [place], [place] [address] Phone: [phone]  Fax: [phone]    06/21/2022, 6:22 PM  This note was partially dictated with voice recognition software. Similar sounding words can be transcribed inadequately or may not  be corrected upon review.

## 2022-08-04 ENCOUNTER — Other Ambulatory Visit: Payer: Self-pay | Admitting: "Endocrinology

## 2022-09-12 ENCOUNTER — Other Ambulatory Visit: Payer: Self-pay | Admitting: "Endocrinology

## 2022-09-22 ENCOUNTER — Other Ambulatory Visit: Payer: Self-pay

## 2022-09-22 ENCOUNTER — Ambulatory Visit (INDEPENDENT_AMBULATORY_CARE_PROVIDER_SITE_OTHER): Payer: Managed Care, Other (non HMO) | Admitting: "Endocrinology

## 2022-09-22 ENCOUNTER — Encounter: Payer: Self-pay | Admitting: "Endocrinology

## 2022-09-22 VITALS — BP 96/62 | HR 80 | Ht 64.0 in | Wt 213.6 lb

## 2022-09-22 DIAGNOSIS — E782 Mixed hyperlipidemia: Secondary | ICD-10-CM | POA: Diagnosis not present

## 2022-09-22 DIAGNOSIS — E1165 Type 2 diabetes mellitus with hyperglycemia: Secondary | ICD-10-CM | POA: Diagnosis not present

## 2022-09-22 DIAGNOSIS — E559 Vitamin D deficiency, unspecified: Secondary | ICD-10-CM | POA: Diagnosis not present

## 2022-09-22 DIAGNOSIS — I10 Essential (primary) hypertension: Secondary | ICD-10-CM

## 2022-09-22 DIAGNOSIS — Z6837 Body mass index (BMI) 37.0-37.9, adult: Secondary | ICD-10-CM

## 2022-09-22 LAB — POCT GLYCOSYLATED HEMOGLOBIN (HGB A1C): HbA1c, POC (controlled diabetic range): 6.8 % (ref 0.0–7.0)

## 2022-09-22 MED ORDER — LOSARTAN POTASSIUM-HCTZ 50-12.5 MG PO TABS: 1.0000 | ORAL_TABLET | Freq: Every day | ORAL | 1 refills | Status: DC

## 2022-09-22 MED ORDER — ACCU-CHEK GUIDE ME W/DEVICE KIT: 1.0000 | PACK | 0 refills | Status: AC

## 2022-09-22 MED ORDER — ACCU-CHEK GUIDE VI STRP: ORAL_STRIP | 2 refills | Status: DC

## 2022-09-22 NOTE — Progress Notes (Signed)
09/22/2022, 1:44 PM   Endocrinology follow-up note  Subjective:    Patient ID: Victoria Williams, female    DOB: 1966-05-05.  Victoria Williams is being seen in follow-up after she was seen in consultation for management of currently uncontrolled symptomatic diabetes requested by  Sharilyn Sites, MD.   Past Medical History:  Diagnosis Date   Arthritis    Bell's palsy    approx 2015   Chronic kidney disease    Diabetes mellitus    only when on steriods   Esophagitis 2012   EGD   Family history of adverse reaction to anesthesia    PONV and hallucinations-MOM   Henoch-Schonlein purpura ([place])    History of kidney stones    History of UTI    Hypertension    IBS (irritable bowel syndrome)    Obesity    Sleep apnea     Past Surgical History:  Procedure Laterality Date   ABDOMINAL HYSTERECTOMY  2003   ACHILLES TENDON SURGERY Left    CESAREAN SECTION     CHOLECYSTECTOMY N/A 01/03/2020   Procedure: LAPAROSCOPIC CHOLECYSTECTOMY;  Surgeon: Virl Cagey, MD;  Location: [facility];  Service: General;  Laterality: N/A;   KNEE SURGERY Left    arthroscopic   NECK SURGERY  2000   cervical fusion 6/7   REPLACEMENT TOTAL KNEE Left 2019   [facility]   TONSILECTOMY, ADENOIDECTOMY, BILATERAL MYRINGOTOMY AND TUBES     TONSILLECTOMY     TUBAL LIGATION  1996    Social History   Socioeconomic History   Marital status: Married    Spouse name: Not on file   Number of children: 3   Years of education: some college   Highest education level: Not on file  Occupational History    Comment: [employer], loan processor  Tobacco Use   Smoking status: Never   Smokeless tobacco: Never  Vaping Use   Vaping Use: Never used  Substance and Sexual Activity   Alcohol use: Yes    Alcohol/week: 1.0 standard drink of alcohol    Types: 1 Glasses of wine per week    Comment: 2 a month    Drug use: No   Sexual activity: Yes     Birth control/protection: Surgical  Other Topics Concern   Not on file  Social History Narrative   Lives with spouse   2 caffeine drinks daily    Social Determinants of Health   Financial Resource Strain: Not on file  Food Insecurity: Not on file  Transportation Needs: Not on file  Physical Activity: Not on file  Stress: Not on file  Social Connections: Not on file    Family History  Problem Relation Age of Onset   Leukemia Mother    Diabetes Mother    Breast cancer Mother    Hypertension Mother    Stroke Mother    Esophageal cancer Brother    Diabetes Maternal Aunt    Diabetes Maternal Uncle    Diabetes Maternal Grandfather    Heart attack Father    Heart failure Father    Colon cancer Neg  Hx     Outpatient Encounter Medications as of 09/22/2022  Medication Sig   Blood Glucose Monitoring Suppl (ACCU-CHEK GUIDE ME) w/Device KIT 1 Piece by Does not apply route as directed.   glucose blood (ACCU-CHEK GUIDE) test strip Use as instructed   acetaminophen (TYLENOL) 500 MG tablet Take 1,000 mg by mouth 2 (two) times daily as needed for headache.   B-D ULTRAFINE III SHORT PEN 31G X 8 MM MISC USE ONCE DAILY   calcium carbonate (TUMS - DOSED IN MG ELEMENTAL CALCIUM) 500 MG chewable tablet Chew 1 tablet by mouth daily as needed for indigestion or heartburn.   cholecalciferol (VITAMIN D3) 25 MCG (1000 UNIT) tablet Take 2,000 Units by mouth daily.   dicyclomine (BENTYL) 10 MG capsule Take 10 mg by mouth 2 (two) times daily.    glipiZIDE (GLUCOTROL) 5 MG tablet TAKE 1 TABLET(5 MG) BY MOUTH TWICE DAILY   ibuprofen (ADVIL,MOTRIN) 200 MG tablet Take 600 mg by mouth 2 (two) times daily as needed for moderate pain.   lidocaine (XYLOCAINE) 2 % solution Use as directed 15 mLs in the mouth or throat as needed for mouth pain.   losartan-hydrochlorothiazide (HYZAAR) 100-25 MG tablet Take 1 tablet by mouth daily.   polyethylene glycol (MIRALAX) 17 g packet Take 17 g by mouth daily as needed  for moderate constipation.   rosuvastatin (CRESTOR) 10 MG tablet TAKE 1 TABLET(10 MG) BY MOUTH DAILY   tolterodine (DETROL) 2 MG tablet Take 2 mg by mouth 2 (two) times daily.   TRESIBA FLEXTOUCH 100 UNIT/ML FlexTouch Pen ADMINISTER 60 UNITS UNDER THE SKIN AT BEDTIME   [DISCONTINUED] Blood Glucose Monitoring Suppl (ONETOUCH VERIO) w/Device KIT 1 each by Does not apply route as needed.   [DISCONTINUED] glucose blood (ONETOUCH VERIO) test strip Use to test blood glucose 4 times daily.   [DISCONTINUED] nitrofurantoin, macrocrystal-monohydrate, (MACROBID) 100 MG capsule Take 1 capsule (100 mg total) by mouth 2 (two) times daily.   [DISCONTINUED] OneTouch Delica Lancets 31S MISC Use to check blood glucose 4 times daily.   No facility-administered encounter medications on file as of 09/22/2022.    ALLERGIES: Allergies  Allergen Reactions   Hydromorphone Hcl Nausea And Vomiting   Sulfa Antibiotics Nausea And Vomiting   Cephalexin Hives   Ciprocin-Fluocin-Procin [Fluocinolone Acetonide] Hives   Bee Venom Swelling    REACTION: Severe swelling   Levofloxacin Hives   Celebrex [Celecoxib] Rash    itching   Penicillins Rash    Did it involve swelling of the face/tongue/throat, SOB, or low BP? No Did it involve sudden or severe rash/hives, skin peeling, or any reaction on the inside of your mouth or nose? Yes Did you need to seek medical attention at a hospital or doctor's office? Unknown When did it last happen? childhood       If all above answers are "NO", may proceed with cephalosporin use.     VACCINATION STATUS:  There is no immunization history on file for this patient.  Diabetes She presents for her follow-up diabetic visit. She has type 2 diabetes mellitus. Her disease course has been stable. There are no hypoglycemic associated symptoms. Pertinent negatives for hypoglycemia include no confusion, headaches, pallor or seizures. Pertinent negatives for diabetes include no chest pain,  no fatigue, no polydipsia, no polyphagia and no polyuria. There are no hypoglycemic complications. Symptoms are stable. There are no diabetic complications. Risk factors for coronary artery disease include dyslipidemia, diabetes mellitus, family history, hypertension, obesity, post-menopausal and sedentary lifestyle. Current  diabetic treatment includes insulin injections and oral agent (monotherapy). Her weight is fluctuating minimally. She is following a generally unhealthy diet. When asked about meal planning, she reported none. She has not had a previous visit with a dietitian. She participates in exercise intermittently. Her home blood glucose trend is fluctuating minimally. Her breakfast blood glucose range is generally 180-200 mg/dl. Her bedtime blood glucose range is generally 180-200 mg/dl. Her overall blood glucose range is 180-200 mg/dl. (She presents with glycemic profile BG 195 over the last 30 days, however her point-of-care A1c is 6.8%.  Her A1c was 6.7% during her last visit.  This is an overall improvement from 11.4%.  She did not document any hypoglycemia.   ) An ACE inhibitor/angiotensin II receptor blocker is being taken. Eye exam is current.  Hypertension This is a chronic problem. The current episode started more than 1 year ago. The problem is controlled. Pertinent negatives include no chest pain, headaches, palpitations or shortness of breath. Risk factors for coronary artery disease include diabetes mellitus, dyslipidemia, family history, obesity, post-menopausal state and sedentary lifestyle. Past treatments include angiotensin blockers.  Hyperlipidemia This is a chronic problem. The current episode started more than 1 year ago. The problem is uncontrolled. Pertinent negatives include no chest pain, myalgias or shortness of breath. She is currently on no antihyperlipidemic treatment. Risk factors for coronary artery disease include diabetes mellitus, dyslipidemia, family history,  hypertension, obesity, a sedentary lifestyle and post-menopausal.     Review of Systems  Constitutional:  Negative for chills, fatigue, fever and unexpected weight change.  HENT:  Negative for trouble swallowing and voice change.   Eyes:  Negative for visual disturbance.  Respiratory:  Negative for cough, shortness of breath and wheezing.   Cardiovascular:  Negative for chest pain, palpitations and leg swelling.  Gastrointestinal:  Negative for diarrhea, nausea and vomiting.  Endocrine: Negative for cold intolerance, heat intolerance, polydipsia, polyphagia and polyuria.  Musculoskeletal:  Negative for arthralgias and myalgias.  Skin:  Negative for color change, pallor, rash and wound.  Neurological:  Negative for seizures and headaches.  Psychiatric/Behavioral:  Negative for confusion and suicidal ideas.     Objective:       09/22/2022   10:00 AM 06/21/2022   10:51 AM 02/09/2022    3:01 PM  Vitals with BMI  Height _0  _1  _2   Weight 213 lbs 10 oz 216 lbs 13 oz 217 lbs 3 oz  BMI 36.65 24.2 68.34  Systolic 96 196 98  Diastolic 62 74 64  Pulse 80 64 60    BP 96/62   Pulse 80   Ht _3  (1.626 m)   Wt 213 lb 9.6 oz (96.9 kg)   BMI 36.66 kg/m   Wt Readings from Last 3 Encounters:  09/22/22 213 lb 9.6 oz (96.9 kg)  06/21/22 216 lb 12.8 oz (98.3 kg)  02/09/22 217 lb 3.2 oz (98.5 kg)       CMP ( most recent) CMP     Component Value Date/Time   NA 140 06/17/2022 0921   K 4.1 06/17/2022 0921   CL 101 06/17/2022 0921   CO2 24 06/17/2022 0921   GLUCOSE 128 (H) 06/17/2022 0921   GLUCOSE 300 (H) 05/20/2020 1521   BUN 15 06/17/2022 0921   CREATININE 0.86 06/17/2022 0921   CALCIUM 9.6 06/17/2022 0921   PROT 6.9 06/17/2022 0921   ALBUMIN 4.4 06/17/2022 0921   AST 14 06/17/2022 0921   ALT 14 06/17/2022 0921  ALKPHOS 74 06/17/2022 0921   BILITOT 0.5 06/17/2022 0921   GFRNONAA 99 11/23/2020 1353   GFRAA 114 11/23/2020 1353     Diabetic Labs (most  recent): Lab Results  Component Value Date   HGBA1C 6.8 09/22/2022   HGBA1C 6.7 06/21/2022   HGBA1C 6.5 02/09/2022   MICROALBUR 80 11/26/2020     Lipid Panel ( most recent) Lipid Panel     Component Value Date/Time   CHOL 132 06/17/2022 0921   TRIG 127 06/17/2022 0921   HDL 39 (L) 06/17/2022 0921   CHOLHDL 3.4 06/17/2022 0921   CHOLHDL 5.7 01/27/2014 0556   VLDL 24 01/27/2014 0556   LDLCALC 70 06/17/2022 0921   LABVLDL 23 06/17/2022 0921      Lab Results  Component Value Date   TSH 3.650 06/17/2022   TSH 2.640 11/23/2020   TSH 1.738 11/27/2010   FREET4 0.97 06/17/2022   FREET4 1.07 11/23/2020      Assessment & Plan:   1. Uncontrolled type 2 diabetes mellitus with hyperglycemia (HCC) - [place] has currently uncontrolled symptomatic type 2 DM since  56 years of age.  She presents with glycemic profile BG 195 over the last 30 days, however her point-of-care A1c is 6.8%.  Her A1c was 6.7% during her last visit.  This is an overall improvement from 11.4%.  She did not document any hypoglycemia.      Recent labs reviewed. - I had a long discussion with her about the progressive nature of diabetes and the pathology behind its complications. -She did not report gross complications from her diabetes, however she remains at a high risk for more acute and chronic complications which include CAD, CVA, CKD, retinopathy, and neuropathy. These are all discussed in detail with her.  - I have counseled her on diet  and weight management  by adopting a carbohydrate restricted/protein rich diet. Patient is encouraged to switch to  unprocessed or minimally processed     complex starch and increased protein intake (animal or plant source), fruits, and vegetables. -  she is advised to stick to a routine mealtimes to eat 3 meals  a day and avoid unnecessary snacks ( to snack only to correct hypoglycemia).   - she acknowledges that there is a room for improvement in her food and drink  choices. - Suggestion is made for her to avoid simple carbohydrates  from her diet including Cakes, Sweet Desserts, Ice Cream, Soda (diet and regular), Sweet Tea, Candies, Chips, Cookies, Store Bought Juices, Alcohol , Artificial Sweeteners,  Coffee Creamer, and "Sugar-free" Products, Lemonade. This will help patient to have more stable blood glucose profile and potentially avoid unintended weight gain.  The following Lifestyle Medicine recommendations according to [facility]) were discussed and and offered to patient and she  agrees to start the journey:  A. Whole Foods, Plant-Based Nutrition comprising of fruits and vegetables, plant-based proteins, whole-grain carbohydrates was discussed in detail with the patient.   A list for source of those nutrients were also provided to the patient.  Patient will use only water or unsweetened tea for hydration. B.  The need to stay away from risky substances including alcohol, smoking; obtaining 7 to 9 hours of restorative sleep, at least 150 minutes of moderate intensity exercise weekly, the importance of healthy social connections,  and stress management techniques were discussed. C.  A full color page of  Calorie density of various food groups per pound showing  examples of each food groups was provided to the patient.   - she has been scheduled with Jearld Fenton, RDN, CDE for diabetes education.  - I have approached her with the following individualized plan to manage  her diabetes and patient agrees:   -In light of her presentation with near target glycemic profile, she will not need prandial insulin for now.  She is advised to continue Tresiba 60 units nightly associated with monitoring of blood glucose twice a day-daily before breakfast and at bedtime.   -She did not want to get on a CGM.  There is discordance between her average blood glucose and A1c.  She will be considered for a separate meter and testing   supplies.   -- she is encouraged to call clinic for blood glucose levels less than 70 or above 200 mg /dl. -Whole Foods plant predominant lifestyle nutrition was discussed and recommended to her. - she is advised to continue glipizide 5 mg p.o. twice daily with breakfast and supper.    - Specific targets for  A1c;  LDL, HDL,  and Triglycerides were discussed with the patient.  2) Blood Pressure /Hypertension:  -Her blood pressure is controlled to target.  She has a slightly above target urinary microalbumin.  she is advised to continue her current medications including losartan/HCTZ 100/25 mg p.o. daily with breakfast .  She will be considered for low-dose of this medication on her next refills.   3) Lipids/Hyperlipidemia: Review of her previsit labs show controlled LDL at 70.  She is advised to continue Crestor 10 mg p.o. nightly.   The above mentioned whole food plant-based diet plan will also help with dyslipidemia.     4)  Weight/Diet:  Body mass index is 36.66 kg/m.  -   clearly complicating her diabetes care.   she is  a candidate for weight loss. I discussed with her the fact that loss of 5 - 10% of her  current body weight will have the most impact on her diabetes management.  Exercise, and detailed carbohydrates information provided  -  detailed on discharge instructions.  5) Chronic Care/Health Maintenance:  -she  ison ACEI/ARB and Statin medications and  is encouraged to initiate and continue to follow up with Ophthalmology, Dentist,  Podiatrist at least yearly or according to recommendations, and advised to  stay away from smoking. I have recommended yearly flu vaccine and pneumonia vaccine at least every 5 years; moderate intensity exercise for up to 150 minutes weekly; and  sleep for at least 7 hours a day.  Her screening ABI was negative for PAD in February 2022.  This study will be repeated in February 2027, or sooner if needed.   - she is  advised to maintain close follow up  with Sharilyn Sites, MD for primary care needs, as well as her other providers for optimal and coordinated care.  I spent 27 minutes in the care of the patient today including review of labs from [facility], Lipids, Thyroid Function, Hematology (current and previous including abstractions from other facilities); face-to-face time discussing  her blood glucose readings/logs, discussing hypoglycemia and hyperglycemia episodes and symptoms, medications doses, her options of short and long term treatment based on the latest standards of care / guidelines;  discussion about incorporating lifestyle medicine;  and documenting the encounter. Risk reduction counseling performed per USPSTF guidelines to reduce  obesity and cardiovascular risk factors.     Please refer to Patient Instructions for Blood Glucose Monitoring and Insulin/Medications Dosing  Guide"  in media tab for additional information. Please  also refer to " Patient Self Inventory" in the Media  tab for reviewed elements of pertinent patient history.  Luna Fuse Justo participated in the discussions, expressed understanding, and voiced agreement with the above plans.  All questions were answered to her satisfaction. she is encouraged to contact clinic should she have any questions or concerns prior to her return visit.    Follow up plan: - Return in about 4 months (around 01/21/2023) for F/U with Pre-visit Labs, Meter/CGM/Logs, A1c here.  Glade Lloyd, MD [facility] [address] [place], [place] [address] Phone: [phone]  Fax: [phone]    09/22/2022, 1:44 PM  This note was partially dictated with voice recognition software. Similar sounding words can be transcribed inadequately or may not  be corrected upon review.

## 2022-09-22 NOTE — Patient Instructions (Signed)
                                     Advice for Weight Management  -For most of us the best way to lose weight is by diet management. Generally speaking, diet management means consuming less calories intentionally which over time brings about progressive weight loss.  This can be achieved more effectively by avoiding ultra processed carbohydrates, processed meats, unhealthy fats.    It is critically important to know your numbers: how much calorie you are consuming and how much calorie you need. More importantly, our carbohydrates sources should be unprocessed naturally occurring  complex starch food items.  It is always important to balance nutrition also by  appropriate intake of proteins (mainly plant-based), healthy fats/oils, plenty of fruits and vegetables.   -The American College of Lifestyle Medicine (ACL M) recommends nutrition derived mostly from Whole Food, Plant Predominant Sources example an apple instead of applesauce or apple pie. Eat Plenty of vegetables, Mushrooms, fruits, Legumes, Whole Grains, Nuts, seeds in lieu of processed meats, processed snacks/pastries red meat, poultry, eggs.  Use only water or unsweetened tea for hydration.  The College also recommends the need to stay away from risky substances including alcohol, smoking; obtaining 7-9 hours of restorative sleep, at least 150 minutes of moderate intensity exercise weekly, importance of healthy social connections, and being mindful of stress and seek help when it is overwhelming.    -Sticking to a routine mealtime to eat 3 meals a day and avoiding unnecessary snacks is shown to have a big role in weight control. Under normal circumstances, the only time we burn stored energy is when we are hungry, so allow  some hunger to take place- hunger means no food between appropriate meal times, only water.  It is not advisable to starve.   -It is better to avoid simple carbohydrates including:  Cakes, Sweet Desserts, Ice Cream, Soda (diet and regular), Sweet Tea, Candies, Chips, Cookies, Store Bought Juices, Alcohol in Excess of  1-2 drinks a day, Lemonade,  Artificial Sweeteners, Doughnuts, Coffee Creamers, "Sugar-free" Products, etc, etc.  This is not a complete list.....    -Consulting with certified diabetes educators is proven to provide you with the most accurate and current information on diet.  Also, you may be  interested in discussing diet options/exchanges , we can schedule a visit with Victoria Williams, RDN, CDE for individualized nutrition education.  -Exercise: If you are able: 30 -60 minutes a day ,4 days a week, or 150 minutes of moderate intensity exercise weekly.    The longer the better if tolerated.  Combine stretch, strength, and aerobic activities.  If you were told in the past that you have high risk for cardiovascular diseases, or if you are currently symptomatic, you may seek evaluation by your heart doctor prior to initiating moderate to intense exercise programs.                                  Additional Care Considerations for Diabetes/Prediabetes   -Diabetes  is a chronic disease.  The most important care consideration is regular follow-up with your diabetes care provider with the goal being avoiding or delaying its complications and to take advantage of advances in medications and technology.  If appropriate actions are taken early enough, type 2 diabetes can even be   reversed.  Seek information from the right source.  - Whole Food, Plant Predominant Nutrition is highly recommended: Eat Plenty of vegetables, Mushrooms, fruits, Legumes, Whole Grains, Nuts, seeds in lieu of processed meats, processed snacks/pastries red meat, poultry, eggs as recommended by American College of  Lifestyle Medicine (ACLM).  -Type 2 diabetes is known to coexist with other important comorbidities such as high blood pressure and high cholesterol.  It is critical to control not only the  diabetes but also the high blood pressure and high cholesterol to minimize and delay the risk of complications including coronary artery disease, stroke, amputations, blindness, etc.  The good news is that this diet recommendation for type 2 diabetes is also very helpful for managing high cholesterol and high blood blood pressure.  - Studies showed that people with diabetes will benefit from a class of medications known as ACE inhibitors and statins.  Unless there are specific reasons not to be on these medications, the standard of care is to consider getting one from these groups of medications at an optimal doses.  These medications are generally considered safe and proven to help protect the heart and the kidneys.    - People with diabetes are encouraged to initiate and maintain regular follow-up with eye doctors, foot doctors, dentists , and if necessary heart and kidney doctors.     - It is highly recommended that people with diabetes quit smoking or stay away from smoking, and get yearly  flu vaccine and pneumonia vaccine at least every 5 years.  See above for additional recommendations on exercise, sleep, stress management , and healthy social connections.      

## 2022-09-27 ENCOUNTER — Other Ambulatory Visit (HOSPITAL_COMMUNITY): Payer: Self-pay

## 2022-10-11 ENCOUNTER — Other Ambulatory Visit (HOSPITAL_COMMUNITY): Payer: Self-pay

## 2022-10-11 ENCOUNTER — Telehealth: Payer: Self-pay

## 2022-10-11 NOTE — Telephone Encounter (Signed)
Patient Advocate Encounter   Received notification from [employer] that prior authorization is required for [employer] prefers One Touch Verio Strips.   Please advise if this can be switched or if a PA is still needed  PA not submitted

## 2022-10-27 ENCOUNTER — Encounter: Payer: Self-pay | Admitting: *Deleted

## 2022-11-02 DIAGNOSIS — R3129 Other microscopic hematuria: Secondary | ICD-10-CM | POA: Diagnosis not present

## 2022-12-01 ENCOUNTER — Other Ambulatory Visit: Payer: Self-pay | Admitting: Obstetrics and Gynecology

## 2022-12-01 DIAGNOSIS — Z1231 Encounter for screening mammogram for malignant neoplasm of breast: Secondary | ICD-10-CM

## 2022-12-13 ENCOUNTER — Other Ambulatory Visit: Payer: Self-pay | Admitting: "Endocrinology

## 2022-12-19 DIAGNOSIS — G4733 Obstructive sleep apnea (adult) (pediatric): Secondary | ICD-10-CM | POA: Diagnosis not present

## 2022-12-19 DIAGNOSIS — K219 Gastro-esophageal reflux disease without esophagitis: Secondary | ICD-10-CM | POA: Diagnosis not present

## 2023-01-04 ENCOUNTER — Other Ambulatory Visit: Payer: Self-pay | Admitting: "Endocrinology

## 2023-01-04 DIAGNOSIS — E1165 Type 2 diabetes mellitus with hyperglycemia: Secondary | ICD-10-CM

## 2023-01-05 ENCOUNTER — Telehealth: Payer: Self-pay | Admitting: "Endocrinology

## 2023-01-05 ENCOUNTER — Other Ambulatory Visit: Payer: Self-pay | Admitting: "Endocrinology

## 2023-01-05 MED ORDER — ACCU-CHEK SOFTCLIX LANCETS MISC: 2 refills | Status: AC

## 2023-01-05 NOTE — Telephone Encounter (Signed)
Pt needs a prescription sent in for the Lancets for the accucheck to [facility] on [address] in [place].

## 2023-01-12 ENCOUNTER — Ambulatory Visit
Admission: RE | Admit: 2023-01-12 | Discharge: 2023-01-12 | Disposition: A | Discharge status: Home / Self Care | Payer: BC Managed Care – PPO | Source: Ambulatory Visit | Attending: Obstetrics and Gynecology | Admitting: Obstetrics and Gynecology

## 2023-01-12 DIAGNOSIS — Z1231 Encounter for screening mammogram for malignant neoplasm of breast: Secondary | ICD-10-CM | POA: Diagnosis not present

## 2023-01-17 DIAGNOSIS — E559 Vitamin D deficiency, unspecified: Secondary | ICD-10-CM | POA: Diagnosis not present

## 2023-01-17 DIAGNOSIS — E1165 Type 2 diabetes mellitus with hyperglycemia: Secondary | ICD-10-CM | POA: Diagnosis not present

## 2023-01-17 DIAGNOSIS — L738 Other specified follicular disorders: Secondary | ICD-10-CM | POA: Diagnosis not present

## 2023-01-17 DIAGNOSIS — E6609 Other obesity due to excess calories: Secondary | ICD-10-CM | POA: Diagnosis not present

## 2023-01-17 DIAGNOSIS — L821 Other seborrheic keratosis: Secondary | ICD-10-CM | POA: Diagnosis not present

## 2023-01-17 DIAGNOSIS — N390 Urinary tract infection, site not specified: Secondary | ICD-10-CM | POA: Diagnosis not present

## 2023-01-17 DIAGNOSIS — N3281 Overactive bladder: Secondary | ICD-10-CM | POA: Diagnosis not present

## 2023-01-17 DIAGNOSIS — Z6837 Body mass index (BMI) 37.0-37.9, adult: Secondary | ICD-10-CM | POA: Diagnosis not present

## 2023-01-18 LAB — COMPREHENSIVE METABOLIC PANEL
ALT: 20 IU/L (ref 0–32)
AST: 13 IU/L (ref 0–40)
Albumin/Globulin Ratio: 1.4 (ref 1.2–2.2)
Albumin: 4.3 g/dL (ref 3.8–4.9)
Alkaline Phosphatase: 87 IU/L (ref 44–121)
BUN/Creatinine Ratio: 20 (ref 9–23)
BUN: 16 mg/dL (ref 6–24)
Bilirubin Total: 0.3 mg/dL (ref 0.0–1.2)
CO2: 23 mmol/L (ref 20–29)
Calcium: 9.7 mg/dL (ref 8.7–10.2)
Chloride: 100 mmol/L (ref 96–106)
Creatinine, Ser: 0.82 mg/dL (ref 0.57–1.00)
Globulin, Total: 3 g/dL (ref 1.5–4.5)
Glucose: 132 mg/dL — ABNORMAL HIGH (ref 70–99)
Potassium: 3.9 mmol/L (ref 3.5–5.2)
Sodium: 139 mmol/L (ref 134–144)
Total Protein: 7.3 g/dL (ref 6.0–8.5)
eGFR: 83 mL/min/{1.73_m2} (ref >59)

## 2023-01-18 LAB — VITAMIN D 25 HYDROXY (VIT D DEFICIENCY, FRACTURES): Vit D, 25-Hydroxy: 38 ng/mL (ref 30.0–100.0)

## 2023-01-22 ENCOUNTER — Other Ambulatory Visit: Payer: Self-pay | Admitting: "Endocrinology

## 2023-01-23 ENCOUNTER — Ambulatory Visit: Payer: BC Managed Care – PPO | Admitting: "Endocrinology

## 2023-01-23 ENCOUNTER — Encounter: Payer: Self-pay | Admitting: "Endocrinology

## 2023-01-23 VITALS — BP 92/58 | HR 72 | Ht 64.0 in | Wt 210.2 lb

## 2023-01-23 DIAGNOSIS — E1165 Type 2 diabetes mellitus with hyperglycemia: Secondary | ICD-10-CM

## 2023-01-23 DIAGNOSIS — E559 Vitamin D deficiency, unspecified: Secondary | ICD-10-CM | POA: Diagnosis not present

## 2023-01-23 DIAGNOSIS — I10 Essential (primary) hypertension: Secondary | ICD-10-CM | POA: Diagnosis not present

## 2023-01-23 DIAGNOSIS — E782 Mixed hyperlipidemia: Secondary | ICD-10-CM | POA: Diagnosis not present

## 2023-01-23 LAB — POCT GLYCOSYLATED HEMOGLOBIN (HGB A1C): HbA1c, POC (controlled diabetic range): 6.5 % (ref 0.0–7.0)

## 2023-01-23 NOTE — Patient Instructions (Signed)

## 2023-01-23 NOTE — Progress Notes (Signed)
01/23/2023, 1:14 PM   Endocrinology follow-up note  Subjective:    Patient ID: Victoria Williams, female    DOB: [birth year].  Victoria Williams is being seen in follow-up after she was seen in consultation for management of currently uncontrolled symptomatic diabetes requested by  Sharilyn Sites, MD.   Past Medical History:  Diagnosis Date   Arthritis    Bell's palsy    approx 2015   Chronic kidney disease    Diabetes mellitus    only when on steriods   Esophagitis 2012   EGD   Family history of adverse reaction to anesthesia    PONV and hallucinations-MOM   Henoch-Schonlein purpura    History of kidney stones    History of UTI    Hypertension    IBS (irritable bowel syndrome)    Obesity    Sleep apnea     Past Surgical History:  Procedure Laterality Date   ABDOMINAL HYSTERECTOMY  2003   ACHILLES TENDON SURGERY Left    CESAREAN SECTION     CHOLECYSTECTOMY N/A 01/03/2020   Procedure: LAPAROSCOPIC CHOLECYSTECTOMY;  Surgeon: Virl Cagey, MD;  Location: [facility];  Service: General;  Laterality: N/A;   KNEE SURGERY Left    arthroscopic   NECK SURGERY  2000   cervical fusion 6/7   REPLACEMENT TOTAL KNEE Left 2019   [facility]   TONSILECTOMY, ADENOIDECTOMY, BILATERAL MYRINGOTOMY AND TUBES     TONSILLECTOMY     TUBAL LIGATION  1996    Social History   Socioeconomic History   Marital status: Married    Spouse name: Not on file   Number of children: 3   Years of education: some college   Highest education level: Not on file  Occupational History    Comment: [employer], loan processor  Tobacco Use   Smoking status: Never   Smokeless tobacco: Never  Vaping Use   Vaping Use: Never used  Substance and Sexual Activity   Alcohol use: Yes    Alcohol/week: 1.0 standard drink of alcohol    Types: 1 Glasses of wine per week    Comment: 2 a month    Drug use: No   Sexual activity: Yes     Birth control/protection: Surgical  Other Topics Concern   Not on file  Social History Narrative   Lives with spouse   2 caffeine drinks daily    Social Determinants of Health   Financial Resource Strain: Not on file  Food Insecurity: Not on file  Transportation Needs: Not on file  Physical Activity: Not on file  Stress: Not on file  Social Connections: Not on file    Family History  Problem Relation Age of Onset   Leukemia Mother    Diabetes Mother    Breast cancer Mother    Hypertension Mother    Stroke Mother    Esophageal cancer Brother    Diabetes Maternal Aunt    Diabetes Maternal Uncle    Diabetes Maternal Grandfather    Heart attack Father    Heart failure Father    Colon cancer Neg Hx  Outpatient Encounter Medications as of 01/23/2023  Medication Sig   Accu-Chek Softclix Lancets lancets Use as instructed   acetaminophen (TYLENOL) 500 MG tablet Take 1,000 mg by mouth 2 (two) times daily as needed for headache.   B-D ULTRAFINE III SHORT PEN 31G X 8 MM MISC USE ONCE DAILY   Blood Glucose Monitoring Suppl (ACCU-CHEK GUIDE ME) w/Device KIT 1 Piece by Does not apply route as directed.   calcium carbonate (TUMS - DOSED IN MG ELEMENTAL CALCIUM) 500 MG chewable tablet Chew 1 tablet by mouth daily as needed for indigestion or heartburn.   cholecalciferol (VITAMIN D3) 25 MCG (1000 UNIT) tablet Take 2,000 Units by mouth daily.   dicyclomine (BENTYL) 10 MG capsule Take 10 mg by mouth 2 (two) times daily.    glucose blood (ACCU-CHEK GUIDE) test strip Use as instructed 4 x daily. Dx E11.65   ibuprofen (ADVIL,MOTRIN) 200 MG tablet Take 600 mg by mouth 2 (two) times daily as needed for moderate pain.   JARDIANCE 10 MG TABS tablet Take 10 mg by mouth daily.   lidocaine (XYLOCAINE) 2 % solution Use as directed 15 mLs in the mouth or throat as needed for mouth pain.   losartan-hydrochlorothiazide (HYZAAR) 50-12.5 MG tablet Take 1 tablet by mouth daily.   polyethylene glycol  (MIRALAX) 17 g packet Take 17 g by mouth daily as needed for moderate constipation.   rosuvastatin (CRESTOR) 10 MG tablet TAKE 1 TABLET(10 MG) BY MOUTH DAILY   tolterodine (DETROL) 2 MG tablet Take 2 mg by mouth 2 (two) times daily.   TRESIBA FLEXTOUCH 100 UNIT/ML FlexTouch Pen ADMINISTER 60 UNITS UNDER THE SKIN AT BEDTIME   [DISCONTINUED] glipiZIDE (GLUCOTROL) 5 MG tablet TAKE 1 TABLET(5 MG) BY MOUTH TWICE DAILY   No facility-administered encounter medications on file as of 01/23/2023.    ALLERGIES: Allergies  Allergen Reactions   Hydromorphone Hcl Nausea And Vomiting   Sulfa Antibiotics Nausea And Vomiting   Cephalexin Hives   Ciprocin-Fluocin-Procin [Fluocinolone Acetonide] Hives   Bee Venom Swelling    REACTION: Severe swelling   Levofloxacin Hives   Celebrex [Celecoxib] Rash    itching   Penicillins Rash    Did it involve swelling of the face/tongue/throat, SOB, or low BP? No Did it involve sudden or severe rash/hives, skin peeling, or any reaction on the inside of your mouth or nose? Yes Did you need to seek medical attention at a hospital or doctor's office? Unknown When did it last happen? childhood       If all above answers are "NO", may proceed with cephalosporin use.     VACCINATION STATUS:  There is no immunization history on file for this patient.  Diabetes She presents for her follow-up diabetic visit. She has type 2 diabetes mellitus. Her disease course has been improving. There are no hypoglycemic associated symptoms. Pertinent negatives for hypoglycemia include no confusion, headaches, pallor or seizures. Pertinent negatives for diabetes include no chest pain, no fatigue, no polydipsia, no polyphagia and no polyuria. There are no hypoglycemic complications. Symptoms are improving. There are no diabetic complications. Risk factors for coronary artery disease include dyslipidemia, diabetes mellitus, family history, hypertension, obesity, post-menopausal and sedentary  lifestyle. Current diabetic treatment includes insulin injections and oral agent (monotherapy). Her weight is fluctuating minimally. She is following a generally unhealthy diet. When asked about meal planning, she reported none. She has not had a previous visit with a dietitian. She participates in exercise intermittently. Her home blood glucose trend is decreasing  steadily. Her breakfast blood glucose range is generally 130-140 mg/dl. Her bedtime blood glucose range is generally 140-180 mg/dl. Her overall blood glucose range is 140-180 mg/dl. (She presents with glycemic control with average blood glucose of 134 for the last 30 days.  Her point-of-care A1c is 6.5%, progressively improving from 11.4%.  She did not document hypoglycemia.    ) An ACE inhibitor/angiotensin II receptor blocker is being taken. Eye exam is current.  Hypertension This is a chronic problem. The current episode started more than 1 year ago. The problem is controlled. Pertinent negatives include no chest pain, headaches, palpitations or shortness of breath. Risk factors for coronary artery disease include diabetes mellitus, dyslipidemia, family history, obesity, post-menopausal state and sedentary lifestyle. Past treatments include angiotensin blockers.  Hyperlipidemia This is a chronic problem. The current episode started more than 1 year ago. The problem is uncontrolled. Pertinent negatives include no chest pain, myalgias or shortness of breath. She is currently on no antihyperlipidemic treatment. Risk factors for coronary artery disease include diabetes mellitus, dyslipidemia, family history, hypertension, obesity, a sedentary lifestyle and post-menopausal.     Review of Systems  Constitutional:  Negative for chills, fatigue, fever and unexpected weight change.  HENT:  Negative for trouble swallowing and voice change.   Eyes:  Negative for visual disturbance.  Respiratory:  Negative for cough, shortness of breath and wheezing.    Cardiovascular:  Negative for chest pain, palpitations and leg swelling.  Gastrointestinal:  Negative for diarrhea, nausea and vomiting.  Endocrine: Negative for cold intolerance, heat intolerance, polydipsia, polyphagia and polyuria.  Musculoskeletal:  Negative for arthralgias and myalgias.  Skin:  Negative for color change, pallor, rash and wound.  Neurological:  Negative for seizures and headaches.  Psychiatric/Behavioral:  Negative for confusion and suicidal ideas.     Objective:       01/23/2023    9:51 AM 09/22/2022   10:00 AM 06/21/2022   10:51 AM  Vitals with BMI  Height 5\' 4"  5\' 4"  5\' 4"   Weight 210 lbs 3 oz 213 lbs 10 oz 216 lbs 13 oz  BMI 36.06 [id number]  Systolic 92 96 [id number]  Diastolic 58 62 74  Pulse 72 80 64    BP (!) 92/58   Pulse 72   Ht 5\' 4"  (1.626 m)   Wt 210 lb 3.2 oz (95.3 kg)   BMI 36.08 kg/m   Wt Readings from Last 3 Encounters:  01/23/23 210 lb 3.2 oz (95.3 kg)  09/22/22 213 lb 9.6 oz (96.9 kg)  06/21/22 216 lb 12.8 oz (98.3 kg)       CMP ( most recent) CMP     Component Value Date/Time   NA 139 01/17/2023 0817   K 3.9 01/17/2023 0817   CL 100 01/17/2023 0817   CO2 23 01/17/2023 0817   GLUCOSE 132 (H) 01/17/2023 0817   GLUCOSE 300 (H) 05/20/2020 1521   BUN 16 01/17/2023 0817   CREATININE 0.82 01/17/2023 0817   CALCIUM 9.7 01/17/2023 0817   PROT 7.3 01/17/2023 0817   ALBUMIN 4.3 01/17/2023 0817   AST 13 01/17/2023 0817   ALT 20 01/17/2023 0817   ALKPHOS 87 01/17/2023 0817   BILITOT 0.3 01/17/2023 0817   GFRNONAA 99 11/23/2020 1353   GFRAA 114 11/23/2020 1353     Diabetic Labs (most recent): Lab Results  Component Value Date   HGBA1C 6.5 01/23/2023   HGBA1C 6.8 09/22/2022   HGBA1C 6.7 06/21/2022   MICROALBUR 80 11/26/2020  Lipid Panel ( most recent) Lipid Panel     Component Value Date/Time   CHOL 132 06/17/2022 0921   TRIG 127 06/17/2022 0921   HDL 39 (L) 06/17/2022 0921   CHOLHDL 3.4 06/17/2022 0921   CHOLHDL  5.7 01/27/2014 0556   VLDL 24 01/27/2014 0556   LDLCALC 70 06/17/2022 0921   LABVLDL 23 06/17/2022 0921      Lab Results  Component Value Date   TSH 3.650 06/17/2022   TSH 2.640 11/23/2020   TSH 1.738 11/27/2010   FREET4 0.97 06/17/2022   FREET4 1.07 11/23/2020      Assessment & Plan:   1. Uncontrolled type 2 diabetes mellitus with hyperglycemia (HCC) - [place] has currently uncontrolled symptomatic type 2 DM since  57 years of age.  She presents with glycemic control with average blood glucose of 134 for the last 30 days.  Her point-of-care A1c is 6.5%, progressively improving from 11.4%.  She did not document hypoglycemia.      Recent labs reviewed. - I had a long discussion with her about the progressive nature of diabetes and the pathology behind its complications. -She did not report gross complications from her diabetes, however she remains at a high risk for more acute and chronic complications which include CAD, CVA, CKD, retinopathy, and neuropathy. These are all discussed in detail with her.  - I have counseled her on diet  and weight management  by adopting a carbohydrate restricted/protein rich diet. Patient is encouraged to switch to  unprocessed or minimally processed     complex starch and increased protein intake (animal or plant source), fruits, and vegetables. -  she is advised to stick to a routine mealtimes to eat 3 meals  a day and avoid unnecessary snacks ( to snack only to correct hypoglycemia).   - she acknowledges that there is a room for improvement in her food and drink choices. - Suggestion is made for her to avoid simple carbohydrates  from her diet including Cakes, Sweet Desserts, Ice Cream, Soda (diet and regular), Sweet Tea, Candies, Chips, Cookies, Store Bought Juices, Alcohol , Artificial Sweeteners,  Coffee Creamer, and "Sugar-free" Products, Lemonade. This will help patient to have more stable blood glucose profile and potentially avoid  unintended weight gain.  The following Lifestyle Medicine recommendations according to [place]  [facility]) were discussed and and offered to patient and she  agrees to start the journey:  A. Whole Foods, Plant-Based Nutrition comprising of fruits and vegetables, plant-based proteins, whole-grain carbohydrates was discussed in detail with the patient.   A list for source of those nutrients were also provided to the patient.  Patient will use only water or unsweetened tea for hydration. B.  The need to stay away from risky substances including alcohol, smoking; obtaining 7 to 9 hours of restorative sleep, at least 150 minutes of moderate intensity exercise weekly, the importance of healthy social connections,  and stress management techniques were discussed. C.  A full color page of  Calorie density of various food groups per pound showing examples of each food groups was provided to the patient.   - she has been scheduled with Jearld Fenton, RDN, CDE for diabetes education.  - I have approached her with the following individualized plan to manage  her diabetes and patient agrees:   -In light of her presentation with near target glycemic profile, she will not need prandial insulin for now.  She is advised to continue  Tresiba 60 units nightly, continue monitoring blood glucose twice a day-daily before breakfast and at bedtime.     -She did not want to get on a CGM.    - she is encouraged to call clinic for blood glucose levels less than 70 or above 200 mg /dl. -Whole Foods plant predominant lifestyle nutrition was discussed and recommended to her. -She is now on Jardiance 10 mg p.o. daily at breakfast.  She will be taken off of glipizide for now.   - Specific targets for  A1c;  LDL, HDL,  and Triglycerides were discussed with the patient.  2) Blood Pressure /Hypertension:  -Her blood pressure is controlled tightly at 92/58.  She has a slightly above target urinary  microalbumin.  she is advised to continue her current medications including losartan/HCTZ at 50/12.5 mg p.o. daily.  This medication was adjusted from a higher dose of 100/25 mg.  She will be considered for low-dose of this medication on her next refills.   3) Lipids/Hyperlipidemia: Review of her previsit labs show controlled LDL at 70.  She is advised to continue Crestor 10 mg p.o. nightly.     The above mentioned whole food plant-based diet plan will also help with dyslipidemia.     4)  Weight/Diet:  Body mass index is 36.08 kg/m.  -   clearly complicating her diabetes care.   she is  a candidate for weight loss. I discussed with her the fact that loss of 5 - 10% of her  current body weight will have the most impact on her diabetes management.  Exercise, and detailed carbohydrates information provided  -  detailed on discharge instructions.  5) Chronic Care/Health Maintenance:  -she  ison ACEI/ARB and Statin medications and  is encouraged to initiate and continue to follow up with Ophthalmology, Dentist,  Podiatrist at least yearly or according to recommendations, and advised to  stay away from smoking. I have recommended yearly flu vaccine and pneumonia vaccine at least every 5 years; moderate intensity exercise for up to 150 minutes weekly; and  sleep for at least 7 hours a day.  Her screening ABI was negative for PAD in February 2022.  This study will be repeated in February 2027, or sooner if needed.   - she is  advised to maintain close follow up with Sharilyn Sites, MD for primary care needs, as well as her other providers for optimal and coordinated care.   I spent  26  minutes in the care of the patient today including review of labs from [place], Lipids, Thyroid Function, Hematology (current and previous including abstractions from other facilities); face-to-face time discussing  her blood glucose readings/logs, discussing hypoglycemia and hyperglycemia episodes and symptoms, medications  doses, her options of short and long term treatment based on the latest standards of care / guidelines;  discussion about incorporating lifestyle medicine;  and documenting the encounter. Risk reduction counseling performed per USPSTF guidelines to reduce  obesity and cardiovascular risk factors.     Please refer to Patient Instructions for Blood Glucose Monitoring and Insulin/Medications Dosing Guide"  in media tab for additional information. Please  also refer to " Patient Self Inventory" in the Media  tab for reviewed elements of pertinent patient history.  Luna Fuse Luhn participated in the discussions, expressed understanding, and voiced agreement with the above plans.  All questions were answered to her satisfaction. she is encouraged to contact clinic should she have any questions or concerns prior to her return visit.  Follow up plan: - Return in about 6 months (around 07/25/2023) for F/U with Pre-visit Labs, Meter/CGM/Logs, A1c here.  Glade Lloyd, MD [facility] [address] [place],  [address] Phone: [phone]  Fax: [phone]    01/23/2023, 1:14 PM  This note was partially dictated with voice recognition software. Similar sounding words can be transcribed inadequately or may not  be corrected upon review.

## 2023-02-14 DIAGNOSIS — K648 Other hemorrhoids: Secondary | ICD-10-CM | POA: Diagnosis not present

## 2023-02-14 DIAGNOSIS — K5730 Diverticulosis of large intestine without perforation or abscess without bleeding: Secondary | ICD-10-CM | POA: Diagnosis not present

## 2023-02-14 DIAGNOSIS — Z1211 Encounter for screening for malignant neoplasm of colon: Secondary | ICD-10-CM | POA: Diagnosis not present

## 2023-03-06 ENCOUNTER — Other Ambulatory Visit: Payer: Self-pay | Admitting: "Endocrinology

## 2023-03-15 ENCOUNTER — Other Ambulatory Visit (HOSPITAL_COMMUNITY): Payer: Self-pay | Admitting: Obstetrics and Gynecology

## 2023-03-15 DIAGNOSIS — Z6837 Body mass index (BMI) 37.0-37.9, adult: Secondary | ICD-10-CM | POA: Diagnosis not present

## 2023-03-15 DIAGNOSIS — Z8249 Family history of ischemic heart disease and other diseases of the circulatory system: Secondary | ICD-10-CM

## 2023-03-15 DIAGNOSIS — Z01419 Encounter for gynecological examination (general) (routine) without abnormal findings: Secondary | ICD-10-CM | POA: Diagnosis not present

## 2023-03-15 DIAGNOSIS — Z1151 Encounter for screening for human papillomavirus (HPV): Secondary | ICD-10-CM | POA: Diagnosis not present

## 2023-03-15 DIAGNOSIS — Z1272 Encounter for screening for malignant neoplasm of vagina: Secondary | ICD-10-CM | POA: Diagnosis not present

## 2023-03-23 DIAGNOSIS — G4733 Obstructive sleep apnea (adult) (pediatric): Secondary | ICD-10-CM | POA: Diagnosis not present

## 2023-03-24 DIAGNOSIS — E6609 Other obesity due to excess calories: Secondary | ICD-10-CM | POA: Diagnosis not present

## 2023-03-24 DIAGNOSIS — G510 Bell's palsy: Secondary | ICD-10-CM | POA: Diagnosis not present

## 2023-03-24 DIAGNOSIS — Z6837 Body mass index (BMI) 37.0-37.9, adult: Secondary | ICD-10-CM | POA: Diagnosis not present

## 2023-03-24 DIAGNOSIS — K219 Gastro-esophageal reflux disease without esophagitis: Secondary | ICD-10-CM | POA: Diagnosis not present

## 2023-03-24 DIAGNOSIS — Z01818 Encounter for other preprocedural examination: Secondary | ICD-10-CM | POA: Diagnosis not present

## 2023-03-24 DIAGNOSIS — G4733 Obstructive sleep apnea (adult) (pediatric): Secondary | ICD-10-CM | POA: Diagnosis not present

## 2023-03-24 DIAGNOSIS — E1165 Type 2 diabetes mellitus with hyperglycemia: Secondary | ICD-10-CM | POA: Diagnosis not present

## 2023-03-24 DIAGNOSIS — I10 Essential (primary) hypertension: Secondary | ICD-10-CM | POA: Diagnosis not present

## 2023-03-28 ENCOUNTER — Other Ambulatory Visit: Payer: Self-pay | Admitting: "Endocrinology

## 2023-04-22 ENCOUNTER — Other Ambulatory Visit: Payer: Self-pay | Admitting: "Endocrinology

## 2023-04-23 DIAGNOSIS — G4733 Obstructive sleep apnea (adult) (pediatric): Secondary | ICD-10-CM | POA: Diagnosis not present

## 2023-05-15 ENCOUNTER — Ambulatory Visit (HOSPITAL_COMMUNITY)
Admission: RE | Admit: 2023-05-15 | Discharge: 2023-05-15 | Disposition: A | Discharge status: Home / Self Care | Payer: BC Managed Care – PPO | Source: Ambulatory Visit | Attending: Obstetrics and Gynecology | Admitting: Obstetrics and Gynecology

## 2023-05-15 DIAGNOSIS — Z8249 Family history of ischemic heart disease and other diseases of the circulatory system: Secondary | ICD-10-CM | POA: Insufficient documentation

## 2023-05-16 ENCOUNTER — Encounter: Payer: Self-pay | Admitting: Cardiology

## 2023-05-17 NOTE — Telephone Encounter (Signed)
Elevated coronary calcium score showing she has developed at least some calfified plaque in the arteries of the heart. This means needs to be aggressive in controlling risk factors that can cause more plaque develop such as HTN, hyperlipidemia, diabets, smoking etc. She needs to be on a statin which she already is and LDL needs to be <100 which it had been on labs last year.  This is a screening test, I'm assuming she was not having any chest pains symptoms as the reason it was ordered as that would require a different type of test   Dominga Ferry MD

## 2023-05-23 DIAGNOSIS — G4733 Obstructive sleep apnea (adult) (pediatric): Secondary | ICD-10-CM | POA: Diagnosis not present

## 2023-05-26 ENCOUNTER — Other Ambulatory Visit: Payer: Self-pay | Admitting: "Endocrinology

## 2023-05-30 ENCOUNTER — Ambulatory Visit: Payer: BC Managed Care – PPO | Attending: Nurse Practitioner | Admitting: Nurse Practitioner

## 2023-05-30 ENCOUNTER — Encounter: Payer: Self-pay | Admitting: Nurse Practitioner

## 2023-05-30 VITALS — BP 122/72 | HR 78 | Ht 64.0 in

## 2023-05-30 DIAGNOSIS — E119 Type 2 diabetes mellitus without complications: Secondary | ICD-10-CM

## 2023-05-30 DIAGNOSIS — I2584 Coronary atherosclerosis due to calcified coronary lesion: Secondary | ICD-10-CM

## 2023-05-30 DIAGNOSIS — E669 Obesity, unspecified: Secondary | ICD-10-CM

## 2023-05-30 DIAGNOSIS — I2510 Atherosclerotic heart disease of native coronary artery without angina pectoris: Secondary | ICD-10-CM

## 2023-05-30 DIAGNOSIS — R0602 Shortness of breath: Secondary | ICD-10-CM

## 2023-05-30 DIAGNOSIS — R0789 Other chest pain: Secondary | ICD-10-CM

## 2023-05-30 DIAGNOSIS — I700 Atherosclerosis of aorta: Secondary | ICD-10-CM

## 2023-05-30 DIAGNOSIS — Z794 Long term (current) use of insulin: Secondary | ICD-10-CM

## 2023-05-30 DIAGNOSIS — E785 Hyperlipidemia, unspecified: Secondary | ICD-10-CM

## 2023-05-30 DIAGNOSIS — I10 Essential (primary) hypertension: Secondary | ICD-10-CM

## 2023-05-30 NOTE — Progress Notes (Signed)
Cardiology Office Note:  .   Date:  05/30/2023  ID:  Victoria Williams, DOB [birth year], MRN [id number] PCP: Assunta Found, MD  [facility] Providers Cardiologist:  Dina Rich, MD    History of Present Illness: Victoria Kitchen   Celestia KAAREN Williams is a 57 y.o. female with a PMH of  coronary artery calcification, aortic atherosclerosis, chest pain, HTN, T2DM, arthritis, sleep apnea, obesity, who presents today for elevated coronary calcium score test evaluation.   2016 GXT was normal.   Last seen by Dr. Dina Rich on July 24, 2020 for chest pain. Her description of chest pain was atypical and had since resolved. Dr. Wyline Mood recommended to monitor at the time.   She recently contacted our office noting she had an CT cardiac scoring test performed that revealed a coronary calcium score 122.  Test also revealed aortic atherosclerosis with mild linear atelectasis or scarring in lung bases.   Today she presents for office evaluation.  She states she is a little apprehensive about today's office visit.  Admits to some atypical chest pain, she believes this is anxiety related.  Admits to some burning sensation along back and left lateral side of chest wall, difficult to describe and difficult to pinpoint triggers.  Denies any exertional symptoms. Does admit to some shortness of breath at times. Denies any palpitations, syncope, presyncope, dizziness, orthopnea, PND, swelling or significant weight changes, acute bleeding, or claudication.  SH: Denies any tobacco use.  Drinks alcohol occasionally.  Denies any illicit drug use.  FH: Dad passed away after his seventh MI.  Her father also had history of CABG in [place].  Her father had 6 brothers, several of them also had MIs.  Her brother also had MI prior to when he passed away from head injury. Her mother passed away from breast cancer.   Studies Reviewed: Victoria Kitchen    EKG:  EKG Interpretation Date/Time:  Tuesday May 30 2023 09:30:17 EDT Ventricular Rate:   75 PR Interval:  126 QRS Duration:  92 QT Interval:  390 QTC Calculation: 435 R Axis:   10  Text Interpretation: Normal sinus rhythm Non-specific intra-ventricular conduction delay When compared with ECG of 20-May-2020 15:15, No significant change was found Confirmed by Sharlene Dory [phone]) on 05/30/2023 10:25:12 AM   CT cardiac scoring 05/21/2023:  Coronary calcium score of 122. This was 94th percentile for age-, race-, and sex-matched controls.  Aortic Atherosclerosis (ICD10-I70.0). Mild linear atelectasis or scarring in the lung bases.  Echo 04/2015:  - Left ventricle: The cavity size was normal. There was mild    concentric hypertrophy. Systolic function was normal. The    estimated ejection fraction was in the range of 55% to 60%. While    endocardium was suboptimally visualized, all available views    demonstrated grossly normal wall motion. Doppler parameters are    consistent with abnormal left ventricular relaxation (grade 1    diastolic dysfunction).  Risk Assessment/Calculations:    The 10-year ASCVD risk score (Arnett DK, et al., 2019) is: 5.1%   Values used to calculate the score:     Age: 74 years     Sex: Female     Is Non-Hispanic African American: No     Diabetic: Yes     Tobacco smoker: No     Systolic Blood Pressure: 122 mmHg     Is BP treated: Yes     HDL Cholesterol: 39 mg/dL     Total Cholesterol: 132 mg/dL  Physical Exam:  VS:  BP 122/72   Pulse 78   Ht 5\' 4"  (1.626 m)   SpO2 96%   BMI 36.08 kg/m    Wt Readings from Last 3 Encounters:  01/23/23 210 lb 3.2 oz (95.3 kg)  09/22/22 213 lb 9.6 oz (96.9 kg)  06/21/22 216 lb 12.8 oz (98.3 kg)    GEN: Obese, 57 y.o. female in no acute distress NECK: No JVD; No carotid bruits CARDIAC: S1/S2, RRR, no murmurs, rubs, gallops RESPIRATORY:  Clear to auscultation without rales, wheezing or rhonchi  ABDOMEN: Soft, non-tender, non-distended EXTREMITIES:  No edema; No deformity   ASSESSMENT AND PLAN: .     Coronary artery calcification, aortic atherosclerosis, atypical chest pain Recent CT cardiac scoring revealed coronary calcium score 122 with extracardiac findings of aortic atherosclerosis and mild linear atelectasis or scarring in the lung bases.  She admits to atypical chest pain, with etiology that sounds like mainly coming from anxiety that she believes is contributing to her symptoms.  Will continue to monitor at this time.  Continue rosuvastatin and losartan-HCTZ.  Discussed risk factor modification and lifestyle modification. Heart healthy diet and regular cardiovascular exercise encouraged.   Shortness of breath Etiology multifactorial, highly unlikely that this is cardiac in etiology.  GXT in 2016 was normal.  Echocardiogram in 2016 was normal. Reviewed recent CT findings as mentioned above. Discussed lifestyle modifications.  Will continue to monitor at this time.  If persistent or worsening symptoms, will discussed arranging echocardiogram at next office visit. Heart healthy diet and regular cardiovascular exercise encouraged. Continue to follow with PCP.  HTN Blood pressure well-controlled and at goal.  No medication changes at this time. Discussed to monitor BP at home at least 2 hours after medications and sitting for 5-10 minutes. Heart healthy diet and regular cardiovascular exercise encouraged.   HLD This is being managed by endocrinology.  She has future pending labs arranged by Dr. Fransico Him.  Last set of labs were WNL.  Continue rosuvastatin. Heart healthy diet and regular cardiovascular exercise encouraged.   T2DM Last A1C on file 6.5%. Continue Jardiance and [place].  Diabetic, heart healthy diet and regular cardiovascular exercise encouraged.  Managed by endocrinology.  Continue follow-up with Dr. Fransico Him.  Obesity Weight loss via diet and exercise encouraged. Discussed the impact being overweight would have on cardiovascular risk.  Dispo: Follow-up with me or APP in 3 months or  sooner if anything changes.   Signed, Sharlene Dory, NP

## 2023-05-30 NOTE — Patient Instructions (Addendum)
Medication Instructions:   Your physician recommends that you continue on your current medications as directed. Please refer to the Current Medication list given to you today.  Labwork:  none  Testing/Procedures:  none  Follow-Up:  Your physician recommends that you schedule a follow-up appointment in: 3 months.  Any Other Special Instructions Will Be Listed Below (If Applicable).  If you need a refill on your cardiac medications before your next appointment, please call your pharmacy. 

## 2023-06-02 DIAGNOSIS — M7542 Impingement syndrome of left shoulder: Secondary | ICD-10-CM | POA: Diagnosis not present

## 2023-06-02 DIAGNOSIS — M25512 Pain in left shoulder: Secondary | ICD-10-CM | POA: Diagnosis not present

## 2023-06-21 ENCOUNTER — Other Ambulatory Visit: Payer: Self-pay | Admitting: "Endocrinology

## 2023-06-24 ENCOUNTER — Ambulatory Visit
Admission: EM | Admit: 2023-06-24 | Discharge: 2023-06-24 | Disposition: A | Discharge status: Home / Self Care | Payer: BC Managed Care – PPO | Attending: Nurse Practitioner | Admitting: Nurse Practitioner

## 2023-06-24 DIAGNOSIS — J22 Unspecified acute lower respiratory infection: Secondary | ICD-10-CM | POA: Diagnosis not present

## 2023-06-24 MED ORDER — PROMETHAZINE-DM 6.25-15 MG/5ML PO SYRP: 5.0000 mL | ORAL_SOLUTION | Freq: Four times a day (QID) | ORAL | 0 refills | Status: DC | PRN

## 2023-06-24 MED ORDER — AZITHROMYCIN 250 MG PO TABS: 250.0000 mg | ORAL_TABLET | Freq: Every day | ORAL | 0 refills | Status: DC

## 2023-06-24 NOTE — ED Triage Notes (Signed)
Pt states cough for the past 5 days. States she has used OTC cough medicine with no relief.

## 2023-06-24 NOTE — ED Provider Notes (Signed)
[facility]    CSN: [id number] Arrival date & time: 06/24/23  0945      History   Chief Complaint Chief Complaint  Patient presents with   Cough    HPI Victoria Williams is a 57 y.o. female.   The history is provided by the patient.   Patient presents for complaints of cough that is been present for the past 5 days.  Patient states cough is worse at night when she is lying flat.  Patient states the cough is also interrupting her sleep.  She denies fever, chills, nasal congestion, runny nose, headache, wheezing, shortness of breath, difficulty breathing, chest pain, abdominal pain, nausea, vomiting, or diarrhea.  Patient states that she was around her granddaughter who was sick previously.  Patient reports that she does wear CPAP, but has been unable to tolerate it due to cough.  Also states that she has a history of diabetes.  She has been taking over-the-counter cough and cold medications and using cough drops with no relief. Past Medical History:  Diagnosis Date   Arthritis    Bell's palsy    approx 2015   Chronic kidney disease    Diabetes mellitus    only when on steriods   Esophagitis 2012   EGD   Family history of adverse reaction to anesthesia    PONV and hallucinations-MOM   Henoch-Schonlein purpura (HCC)    History of kidney stones    History of UTI    Hypertension    IBS (irritable bowel syndrome)    Obesity    Sleep apnea     Patient Active Problem List   Diagnosis Date Noted   Class 2 severe obesity due to excess calories with serious comorbidity and body mass index (BMI) of 37.0 to 37.9 in adult (HCC) 06/21/2022   Vitamin D deficiency 08/26/2020   Essential hypertension, benign 08/26/2020   Uncontrolled type 2 diabetes mellitus with hyperglycemia (HCC) 07/01/2020   Mixed hyperlipidemia 07/01/2020   Constipation 02/17/2020   History of adenomatous polyp of colon 02/17/2020   Gallbladder sludge 12/17/2019   Facial paralysis/Bells palsy  01/27/2014   Facial droop 01/26/2014   Facial weakness 01/26/2014   Bloating 09/30/2013   Loss of weight 09/30/2013   Abdominal pain, other specified site 09/30/2013   Abdominal pain, epigastric 02/10/2011   Henoch-Schonlein purpura (HCC) 02/10/2011   Diabetes mellitus without mention of complication 02/10/2011    Past Surgical History:  Procedure Laterality Date   ABDOMINAL HYSTERECTOMY  2003   ACHILLES TENDON SURGERY Left    CESAREAN SECTION     CHOLECYSTECTOMY N/A 01/03/2020   Procedure: LAPAROSCOPIC CHOLECYSTECTOMY;  Surgeon: Lucretia Roers, MD;  Location: [facility];  Service: General;  Laterality: N/A;   KNEE SURGERY Left    arthroscopic   NECK SURGERY  2000   cervical fusion 6/7   REPLACEMENT TOTAL KNEE Left 2019   [facility]   TONSILECTOMY, ADENOIDECTOMY, BILATERAL MYRINGOTOMY AND TUBES     TONSILLECTOMY     TUBAL LIGATION  1996    OB History   No obstetric history on file.      Home Medications    Prior to Admission medications   Medication Sig Start Date End Date Taking? Authorizing Provider  azithromycin (ZITHROMAX) 250 MG tablet Take 1 tablet (250 mg total) by mouth daily. Take first 2 tablets together, then 1 every day until finished. 06/24/23  Yes Jalani Rominger-Warren, Sadie Haber, NP  promethazine-dextromethorphan (PROMETHAZINE-DM) 6.25-15 MG/5ML syrup Take 5 mLs by  mouth 4 (four) times daily as needed. 06/24/23  Yes Briceson Broadwater-Warren, Sadie Haber, NP  Accu-Chek Softclix Lancets lancets Use as instructed 01/05/23   Roma Kayser, MD  acetaminophen (TYLENOL) 500 MG tablet Take 1,000 mg by mouth 2 (two) times daily as needed for headache.    [provider]  B-D ULTRAFINE III SHORT PEN 31G X 8 MM MISC USE ONCE DAILY 05/26/23   Roma Kayser, MD  Blood Glucose Monitoring Suppl (ACCU-CHEK GUIDE ME) w/Device KIT 1 Piece by Does not apply route as directed. 09/22/22   Roma Kayser, MD  calcium carbonate (TUMS - DOSED IN MG ELEMENTAL CALCIUM) 500 MG  chewable tablet Chew 1 tablet by mouth daily as needed for indigestion or heartburn.    [provider]  cholecalciferol (VITAMIN D3) 25 MCG (1000 UNIT) tablet Take 2,000 Units by mouth daily.    [provider]  dicyclomine (BENTYL) 10 MG capsule Take 10 mg by mouth 2 (two) times daily.  03/14/18   [provider]  glucose blood (ACCU-CHEK GUIDE) test strip USE AS DIRECTED qid. Dx E11.65 03/07/23   Roma Kayser, MD  ibuprofen (ADVIL,MOTRIN) 200 MG tablet Take 600 mg by mouth 2 (two) times daily as needed for moderate pain.    [provider]  JARDIANCE 10 MG TABS tablet Take 10 mg by mouth daily.    [provider]  lidocaine (XYLOCAINE) 2 % solution Use as directed 15 mLs in the mouth or throat as needed for mouth pain. 01/04/21   Bing Neighbors, NP  losartan-hydrochlorothiazide (HYZAAR) 50-12.5 MG tablet Take 1 tablet by mouth daily. 09/22/22   Roma Kayser, MD  polyethylene glycol (MIRALAX) 17 g packet Take 17 g by mouth daily as needed for moderate constipation. 01/03/20   Lucretia Roers, MD  rosuvastatin (CRESTOR) 10 MG tablet TAKE 1 TABLET(10 MG) BY MOUTH DAILY 04/25/23   Roma Kayser, MD  tolterodine (DETROL) 2 MG tablet Take 2 mg by mouth 2 (two) times daily.    [provider]  TRESIBA FLEXTOUCH 100 UNIT/ML FlexTouch Pen ADMINISTER 60 UNITS UNDER THE SKIN AT BEDTIME 06/22/23   Nida, Denman George, MD    Family History Family History  Problem Relation Age of Onset   Leukemia Mother    Diabetes Mother    Breast cancer Mother    Hypertension Mother    Stroke Mother    Esophageal cancer Brother    Diabetes Maternal Aunt    Diabetes Maternal Uncle    Diabetes Maternal Grandfather    Heart attack Father    Heart failure Father    Colon cancer Neg Hx     Social History Social History   Tobacco Use   Smoking status: Never   Smokeless tobacco: Never  Vaping Use   Vaping status: Never Used   Substance Use Topics   Alcohol use: Yes    Alcohol/week: 1.0 standard drink of alcohol    Types: 1 Glasses of wine per week    Comment: 2 a month    Drug use: No     Allergies   Hydromorphone hcl, Sulfa antibiotics, Cephalexin, Ciprocin-fluocin-procin [fluocinolone acetonide], Bee venom, Levofloxacin, Celebrex [celecoxib], and Penicillins   Review of Systems Review of Systems Per HPI  Physical Exam Triage Vital Signs ED Triage Vitals [06/24/23 1104]  Encounter Vitals Group     BP 137/76     Systolic BP Percentile      Diastolic BP Percentile  Pulse Rate 78     Resp 16     Temp 97.8 F (36.6 C)     Temp Source Oral     SpO2 94 %     Weight      Height      Head Circumference      Peak Flow      Pain Score 0     Pain Loc      Pain Education      Exclude from Growth Chart    No data found.  Updated Vital Signs BP 137/76 (BP Location: Right Arm)   Pulse 78   Temp 97.8 F (36.6 C) (Oral)   Resp 16   SpO2 94%   Visual Acuity Right Eye Distance:   Left Eye Distance:   Bilateral Distance:    Right Eye Near:   Left Eye Near:    Bilateral Near:     Physical Exam Vitals and nursing note reviewed.  Constitutional:      General: She is not in acute distress.    Appearance: Normal appearance.  HENT:     Head: Normocephalic.     Right Ear: Tympanic membrane, ear canal and external ear normal.     Left Ear: Tympanic membrane, ear canal and external ear normal.     Nose: Nose normal.     Mouth/Throat:     Mouth: Mucous membranes are moist.  Eyes:     Extraocular Movements: Extraocular movements intact.     Conjunctiva/sclera: Conjunctivae normal.     Pupils: Pupils are equal, round, and reactive to light.  Cardiovascular:     Rate and Rhythm: Normal rate and regular rhythm.     Pulses: Normal pulses.     Heart sounds: Normal heart sounds.  Pulmonary:     Effort: Pulmonary effort is normal. No respiratory distress.     Breath sounds: Normal breath  sounds. No stridor. No wheezing, rhonchi or rales.  Abdominal:     General: Bowel sounds are normal.     Palpations: Abdomen is soft.     Tenderness: There is no abdominal tenderness.  Musculoskeletal:     Cervical back: Normal range of motion.  Lymphadenopathy:     Cervical: No cervical adenopathy.  Skin:    General: Skin is warm and dry.  Neurological:     General: No focal deficit present.     Mental Status: She is alert and oriented to person, place, and time.  Psychiatric:        Mood and Affect: Mood normal.        Behavior: Behavior normal.      UC Treatments / Results  Labs (all labs ordered are listed, but only abnormal results are displayed) Labs Reviewed - No data to display  EKG   Radiology No results found.  Procedures Procedures (including critical care time)  Medications Ordered in UC Medications - No data to display  Initial Impression / Assessment and Plan / UC Course  I have reviewed the triage vital signs and the nursing notes.  Pertinent labs & imaging results that were available during my care of the patient were reviewed by me and considered in my medical decision making (see chart for details).  The patient is well-appearing, she is in no acute distress, vital signs are stable.  Symptoms appear to be consistent with a lower respiratory infection.  Will treat empirically with azithromycin 250 mg tablets to cover for infection as patient has a history of  diabetes and chronic kidney disease.  For her cough, Promethazine DM was prescribed.  Supportive care recommendations were provided and discussed with the patient to include use of over-the-counter Tylenol for pain or discomfort, sleeping elevated, and use of a humidifier in her room until she is able to tolerate her CPAP.  Patient was given follow-up precautions.  Patient is in agreement with this plan of care and verbalizes understanding.  All questions were answered.  Patient stable for  discharge.  Final Clinical Impressions(s) / UC Diagnoses   Final diagnoses:  Lower respiratory infection     Discharge Instructions      Take medication as prescribed. Increase fluids and allow for plenty of rest. May take over-the-counter Tylenol as needed for pain, fever, general discomfort. Recommend using a humidifier in the bedroom at nighttime during sleep until you are able to tolerate your CPAP.  Also recommend sleeping elevated on pillows while cough symptoms persist. Please be advised that the cough may continue to linger after you complete your medication.  If you are feeling well, but continue to have a dry cough, recommend over-the-counter cough drops and increasing your fluid intake.  If you experience new symptoms such as wheezing, shortness of breath, difficulty breathing, or other concerns, please follow-up in this clinic or in the emergency department immediately for further evaluation. Follow-up as needed.     ED Prescriptions     Medication Sig Dispense Auth. Provider   azithromycin (ZITHROMAX) 250 MG tablet Take 1 tablet (250 mg total) by mouth daily. Take first 2 tablets together, then 1 every day until finished. 6 tablet Durwin Davisson-Warren, Sadie Haber, NP   promethazine-dextromethorphan (PROMETHAZINE-DM) 6.25-15 MG/5ML syrup Take 5 mLs by mouth 4 (four) times daily as needed. 118 mL Wendie Diskin-Warren, Sadie Haber, NP      PDMP not reviewed this encounter.   Abran Cantor, NP 06/24/23 1139

## 2023-06-24 NOTE — Discharge Instructions (Signed)
Take medication as prescribed. Increase fluids and allow for plenty of rest. May take over-the-counter Tylenol as needed for pain, fever, general discomfort. Recommend using a humidifier in the bedroom at nighttime during sleep until you are able to tolerate your CPAP.  Also recommend sleeping elevated on pillows while cough symptoms persist. Please be advised that the cough may continue to linger after you complete your medication.  If you are feeling well, but continue to have a dry cough, recommend over-the-counter cough drops and increasing your fluid intake.  If you experience new symptoms such as wheezing, shortness of breath, difficulty breathing, or other concerns, please follow-up in this clinic or in the emergency department immediately for further evaluation. Follow-up as needed.

## 2023-07-14 DIAGNOSIS — E1165 Type 2 diabetes mellitus with hyperglycemia: Secondary | ICD-10-CM | POA: Diagnosis not present

## 2023-07-14 DIAGNOSIS — I10 Essential (primary) hypertension: Secondary | ICD-10-CM | POA: Diagnosis not present

## 2023-07-14 DIAGNOSIS — R1013 Epigastric pain: Secondary | ICD-10-CM | POA: Diagnosis not present

## 2023-07-14 DIAGNOSIS — D690 Allergic purpura: Secondary | ICD-10-CM | POA: Diagnosis not present

## 2023-07-15 LAB — LIPID PANEL
Chol/HDL Ratio: 3.2 ratio (ref 0.0–4.4)
Cholesterol, Total: 137 mg/dL (ref 100–199)
HDL: 43 mg/dL (ref >39)
LDL Chol Calc (NIH): 71 mg/dL (ref 0–99)
Triglycerides: 131 mg/dL (ref 0–149)
VLDL Cholesterol Cal: 23 mg/dL (ref 5–40)

## 2023-07-15 LAB — COMPREHENSIVE METABOLIC PANEL
ALT: 19 IU/L (ref 0–32)
AST: 16 IU/L (ref 0–40)
Albumin: 4.2 g/dL (ref 3.8–4.9)
Alkaline Phosphatase: 90 IU/L (ref 44–121)
BUN/Creatinine Ratio: 19 (ref 9–23)
BUN: 15 mg/dL (ref 6–24)
Bilirubin Total: 0.4 mg/dL (ref 0.0–1.2)
CO2: 24 mmol/L (ref 20–29)
Calcium: 9.8 mg/dL (ref 8.7–10.2)
Chloride: 100 mmol/L (ref 96–106)
Creatinine, Ser: 0.81 mg/dL (ref 0.57–1.00)
Globulin, Total: 2.8 g/dL (ref 1.5–4.5)
Glucose: 120 mg/dL — ABNORMAL HIGH (ref 70–99)
Potassium: 4 mmol/L (ref 3.5–5.2)
Sodium: 140 mmol/L (ref 134–144)
Total Protein: 7 g/dL (ref 6.0–8.5)
eGFR: 85 mL/min/{1.73_m2} (ref >59)

## 2023-07-15 LAB — T4, FREE: Free T4: 1.14 ng/dL (ref 0.82–1.77)

## 2023-07-15 LAB — TSH: TSH: 3.01 u[IU]/mL (ref 0.450–4.500)

## 2023-07-25 ENCOUNTER — Ambulatory Visit: Payer: BC Managed Care – PPO | Admitting: "Endocrinology

## 2023-07-25 ENCOUNTER — Encounter: Payer: Self-pay | Admitting: "Endocrinology

## 2023-07-25 VITALS — BP 106/72 | HR 68 | Ht 64.0 in | Wt 210.6 lb

## 2023-07-25 DIAGNOSIS — E559 Vitamin D deficiency, unspecified: Secondary | ICD-10-CM

## 2023-07-25 DIAGNOSIS — I10 Essential (primary) hypertension: Secondary | ICD-10-CM | POA: Diagnosis not present

## 2023-07-25 DIAGNOSIS — E782 Mixed hyperlipidemia: Secondary | ICD-10-CM | POA: Diagnosis not present

## 2023-07-25 DIAGNOSIS — Z794 Long term (current) use of insulin: Secondary | ICD-10-CM | POA: Diagnosis not present

## 2023-07-25 DIAGNOSIS — E1165 Type 2 diabetes mellitus with hyperglycemia: Secondary | ICD-10-CM | POA: Diagnosis not present

## 2023-07-25 LAB — POCT GLYCOSYLATED HEMOGLOBIN (HGB A1C): HbA1c, POC (controlled diabetic range): 6.8 % (ref 0.0–7.0)

## 2023-07-25 MED ORDER — TRESIBA FLEXTOUCH 100 UNIT/ML ~~LOC~~ SOPN: PEN_INJECTOR | SUBCUTANEOUS | 2 refills | Status: DC

## 2023-07-25 MED ORDER — JARDIANCE 10 MG PO TABS: 10.0000 mg | ORAL_TABLET | Freq: Every day | ORAL | 1 refills | Status: DC

## 2023-07-25 NOTE — Patient Instructions (Signed)

## 2023-07-25 NOTE — Progress Notes (Signed)
07/25/2023, 11:14 AM   Endocrinology follow-up note  Subjective:    Patient ID: Victoria Williams, female    DOB: 1966/07/31.  ILEEN STORTS is being seen in follow-up after she was seen in consultation for management of currently controlled asymptomatic diabetes requested by  Assunta Found, MD.   Past Medical History:  Diagnosis Date   Arthritis    Bell's palsy    approx 2015   Chronic kidney disease    Diabetes mellitus    only when on steriods   Esophagitis 2012   EGD   Family history of adverse reaction to anesthesia    PONV and hallucinations-MOM   Henoch-Schonlein purpura (HCC)    History of kidney stones    History of UTI    Hypertension    IBS (irritable bowel syndrome)    Obesity    Sleep apnea     Past Surgical History:  Procedure Laterality Date   ABDOMINAL HYSTERECTOMY  2003   ACHILLES TENDON SURGERY Left    CESAREAN SECTION     CHOLECYSTECTOMY N/A 01/03/2020   Procedure: LAPAROSCOPIC CHOLECYSTECTOMY;  Surgeon: Lucretia Roers, MD;  Location: [facility];  Service: General;  Laterality: N/A;   KNEE SURGERY Left    arthroscopic   NECK SURGERY  2000   cervical fusion 6/7   REPLACEMENT TOTAL KNEE Left 2019   [facility]   TONSILECTOMY, ADENOIDECTOMY, BILATERAL MYRINGOTOMY AND TUBES     TONSILLECTOMY     TUBAL LIGATION  1996    Social History   Socioeconomic History   Marital status: Married    Spouse name: Not on file   Number of children: 3   Years of education: some college   Highest education level: Not on file  Occupational History    Comment: [employer], loan processor  Tobacco Use   Smoking status: Never   Smokeless tobacco: Never  Vaping Use   Vaping status: Never Used  Substance and Sexual Activity   Alcohol use: Yes    Alcohol/week: 1.0 standard drink of alcohol    Types: 1 Glasses of wine per week    Comment: 2 a month    Drug use: No   Sexual activity:  Yes    Birth control/protection: Surgical  Other Topics Concern   Not on file  Social History Narrative   Lives with spouse   2 caffeine drinks daily    Social Determinants of Health   Financial Resource Strain: Low Risk  (06/01/2023)   Received from [facility]   Overall Financial Resource Strain (CARDIA)    Difficulty of Paying Living Expenses: Not hard at all  Food Insecurity: No Food Insecurity (06/01/2023)   Received from [facility]   Hunger Vital Sign    Worried About Running Out of Food in the Last Year: Never true    Ran Out of Food in the Last Year: Never true  Transportation Needs: No Transportation Needs (06/01/2023)   Received from [facility] - Transportation    Lack of Transportation (Medical): No    Lack of Transportation (Non-Medical): No  Physical Activity: Insufficiently Active (06/01/2023)   Received from [facility]   Exercise Vital Sign    Days of Exercise per Week: 1 day    Minutes of Exercise per Session: 20 min  Stress: No Stress Concern Present (06/01/2023)   Received from [facility] - Occupational Stress Questionnaire    Feeling of Stress : Not at all  Social Connections: Socially Integrated (06/01/2023)   Received from [facility]   Social Network    How would you rate your social network (family, work, friends)?: Good participation with social networks    Family History  Problem Relation Age of Onset   Leukemia Mother    Diabetes Mother    Breast cancer Mother    Hypertension Mother    Stroke Mother    Esophageal cancer Brother    Diabetes Maternal Aunt    Diabetes Maternal Uncle    Diabetes Maternal Grandfather    Heart attack Father    Heart failure Father    Colon cancer Neg Hx     Outpatient Encounter Medications as of 07/25/2023  Medication Sig   Accu-Chek Softclix Lancets lancets Use as instructed   acetaminophen (TYLENOL) 500 MG tablet Take 1,000 mg by mouth 2 (two) times  daily as needed for headache.   B-D ULTRAFINE III SHORT PEN 31G X 8 MM MISC USE ONCE DAILY   Blood Glucose Monitoring Suppl (ACCU-CHEK GUIDE ME) w/Device KIT 1 Piece by Does not apply route as directed.   calcium carbonate (TUMS - DOSED IN MG ELEMENTAL CALCIUM) 500 MG chewable tablet Chew 1 tablet by mouth daily as needed for indigestion or heartburn.   cholecalciferol (VITAMIN D3) 25 MCG (1000 UNIT) tablet Take 2,000 Units by mouth daily.   dicyclomine (BENTYL) 10 MG capsule Take 10 mg by mouth 2 (two) times daily.    glucose blood (ACCU-CHEK GUIDE) test strip USE AS DIRECTED qid. Dx E11.65   ibuprofen (ADVIL,MOTRIN) 200 MG tablet Take 600 mg by mouth 2 (two) times daily as needed for moderate pain.   insulin degludec (TRESIBA FLEXTOUCH) 100 UNIT/ML FlexTouch Pen ADMINISTER 60 UNITS UNDER THE SKIN AT BEDTIME   JARDIANCE 10 MG TABS tablet Take 1 tablet (10 mg total) by mouth daily.   losartan-hydrochlorothiazide (HYZAAR) 50-12.5 MG tablet Take 1 tablet by mouth daily.   rosuvastatin (CRESTOR) 10 MG tablet TAKE 1 TABLET(10 MG) BY MOUTH DAILY   tolterodine (DETROL) 2 MG tablet Take 2 mg by mouth 2 (two) times daily.   [DISCONTINUED] azithromycin (ZITHROMAX) 250 MG tablet Take 1 tablet (250 mg total) by mouth daily. Take first 2 tablets together, then 1 every day until finished.   [DISCONTINUED] JARDIANCE 10 MG TABS tablet Take 10 mg by mouth daily.   [DISCONTINUED] lidocaine (XYLOCAINE) 2 % solution Use as directed 15 mLs in the mouth or throat as needed for mouth pain.   [DISCONTINUED] polyethylene glycol (MIRALAX) 17 g packet Take 17 g by mouth daily as needed for moderate constipation.   [DISCONTINUED] promethazine-dextromethorphan (PROMETHAZINE-DM) 6.25-15 MG/5ML syrup Take 5 mLs by mouth 4 (four) times daily as needed.   [DISCONTINUED] TRESIBA FLEXTOUCH 100 UNIT/ML FlexTouch Pen ADMINISTER 60 UNITS UNDER THE SKIN AT BEDTIME   No facility-administered encounter medications on file as of  07/25/2023.    ALLERGIES: Allergies  Allergen Reactions   Hydromorphone Hcl Nausea And Vomiting   Sulfa Antibiotics Nausea And Vomiting   Cephalexin Hives   Ciprocin-Fluocin-Procin [Fluocinolone Acetonide] Hives  Bee Venom Swelling    REACTION: Severe swelling   Levofloxacin Hives   Celebrex [Celecoxib] Rash    itching   Penicillins Rash    Did it involve swelling of the face/tongue/throat, SOB, or low BP? No Did it involve sudden or severe rash/hives, skin peeling, or any reaction on the inside of your mouth or nose? Yes Did you need to seek medical attention at a hospital or doctor's office? Unknown When did it last happen? childhood       If all above answers are "NO", may proceed with cephalosporin use.     VACCINATION STATUS:  There is no immunization history on file for this patient.  Diabetes She presents for her follow-up diabetic visit. She has type 2 diabetes mellitus. Her disease course has been fluctuating. There are no hypoglycemic associated symptoms. Pertinent negatives for hypoglycemia include no confusion, headaches, pallor or seizures. Pertinent negatives for diabetes include no chest pain, no fatigue, no polydipsia, no polyphagia and no polyuria. There are no hypoglycemic complications. Symptoms are improving. There are no diabetic complications. Risk factors for coronary artery disease include dyslipidemia, diabetes mellitus, family history, hypertension, obesity, post-menopausal and sedentary lifestyle. Current diabetic treatment includes insulin injections and oral agent (monotherapy). Her weight is fluctuating minimally. She is following a generally unhealthy diet. When asked about meal planning, she reported none. She has not had a previous visit with a dietitian. She participates in exercise intermittently. Her home blood glucose trend is fluctuating minimally. Her breakfast blood glucose range is generally 130-140 mg/dl. Her bedtime blood glucose range is  generally 140-180 mg/dl. Her overall blood glucose range is 140-180 mg/dl. (She presents with controlled glycemic profile.  Her point-of-care A1c is 6.8%.  She did not document hypoglycemia.    ) An ACE inhibitor/angiotensin II receptor blocker is being taken. Eye exam is current.  Hypertension This is a chronic problem. The current episode started more than 1 year ago. The problem is controlled. Pertinent negatives include no chest pain, headaches, palpitations or shortness of breath. Risk factors for coronary artery disease include diabetes mellitus, dyslipidemia, family history, obesity, post-menopausal state and sedentary lifestyle. Past treatments include angiotensin blockers.  Hyperlipidemia This is a chronic problem. The current episode started more than 1 year ago. The problem is uncontrolled. Pertinent negatives include no chest pain, myalgias or shortness of breath. She is currently on no antihyperlipidemic treatment. Risk factors for coronary artery disease include diabetes mellitus, dyslipidemia, family history, hypertension, obesity, a sedentary lifestyle and post-menopausal.     Review of Systems  Constitutional:  Negative for chills, fatigue, fever and unexpected weight change.  HENT:  Negative for trouble swallowing and voice change.   Eyes:  Negative for visual disturbance.  Respiratory:  Negative for cough, shortness of breath and wheezing.   Cardiovascular:  Negative for chest pain, palpitations and leg swelling.  Gastrointestinal:  Negative for diarrhea, nausea and vomiting.  Endocrine: Negative for cold intolerance, heat intolerance, polydipsia, polyphagia and polyuria.  Musculoskeletal:  Negative for arthralgias and myalgias.  Skin:  Negative for color change, pallor, rash and wound.  Neurological:  Negative for seizures and headaches.  Psychiatric/Behavioral:  Negative for confusion and suicidal ideas.     Objective:       07/25/2023   10:47 AM 06/24/2023   11:04 AM  05/30/2023    9:15 AM  Vitals with BMI  Height 5\' 4"   5\' 4"   Weight 210 lbs 10 oz    BMI 36.13    Systolic  106 137 122  Diastolic 72 76 72  Pulse 68 78 78    BP 106/72   Pulse 68   Ht 5\' 4"  (1.626 m)   Wt 210 lb 9.6 oz (95.5 kg)   BMI 36.15 kg/m   Wt Readings from Last 3 Encounters:  07/25/23 210 lb 9.6 oz (95.5 kg)  01/23/23 210 lb 3.2 oz (95.3 kg)  09/22/22 213 lb 9.6 oz (96.9 kg)       CMP ( most recent) CMP     Component Value Date/Time   NA 140 07/14/2023 1037   K 4.0 07/14/2023 1037   CL 100 07/14/2023 1037   CO2 24 07/14/2023 1037   GLUCOSE 120 (H) 07/14/2023 1037   GLUCOSE 300 (H) 05/20/2020 1521   BUN 15 07/14/2023 1037   CREATININE 0.81 07/14/2023 1037   CALCIUM 9.8 07/14/2023 1037   PROT 7.0 07/14/2023 1037   ALBUMIN 4.2 07/14/2023 1037   AST 16 07/14/2023 1037   ALT 19 07/14/2023 1037   ALKPHOS 90 07/14/2023 1037   BILITOT 0.4 07/14/2023 1037   GFRNONAA 99 11/23/2020 1353   GFRAA 114 11/23/2020 1353     Diabetic Labs (most recent): Lab Results  Component Value Date   HGBA1C 6.8 07/25/2023   HGBA1C 6.5 01/23/2023   HGBA1C 6.8 09/22/2022   MICROALBUR 80 11/26/2020     Lipid Panel ( most recent) Lipid Panel     Component Value Date/Time   CHOL 137 07/14/2023 1037   TRIG 131 07/14/2023 1037   HDL 43 07/14/2023 1037   CHOLHDL 3.2 07/14/2023 1037   CHOLHDL 5.7 01/27/2014 0556   VLDL 24 01/27/2014 0556   LDLCALC 71 07/14/2023 1037   LABVLDL 23 07/14/2023 1037      Lab Results  Component Value Date   TSH 3.010 07/14/2023   TSH 3.650 06/17/2022   TSH 2.640 11/23/2020   TSH 1.738 11/27/2010   FREET4 1.14 07/14/2023   FREET4 0.97 06/17/2022   FREET4 1.07 11/23/2020      Assessment & Plan:   1. Uncontrolled type 2 diabetes mellitus with hyperglycemia (HCC) - Zawadi M Stoker has currently uncontrolled symptomatic type 2 DM since  57 years of age.  She presents with controlled glycemic profile.  Her point-of-care A1c is 6.8%.  She  did not document hypoglycemia.      Recent labs reviewed. - I had a long discussion with her about the progressive nature of diabetes and the pathology behind its complications. -She did not report gross complications from her diabetes, however she remains at a high risk for more acute and chronic complications which include CAD, CVA, CKD, retinopathy, and neuropathy. These are all discussed in detail with her.  - I have counseled her on diet  and weight management  by adopting a carbohydrate restricted/protein rich diet. Patient is encouraged to switch to  unprocessed or minimally processed     complex starch and increased protein intake (animal or plant source), fruits, and vegetables. -  she is advised to stick to a routine mealtimes to eat 3 meals  a day and avoid unnecessary snacks ( to snack only to correct hypoglycemia).   - she acknowledges that there is a room for improvement in her food and drink choices. - Suggestion is made for her to avoid simple carbohydrates  from her diet including Cakes, Sweet Desserts, Ice Cream, Soda (diet and regular), Sweet Tea, Candies, Chips, Cookies, Store Bought Juices, Alcohol , Artificial Sweeteners,  Coffee Creamer, and "  Sugar-free" Products, Lemonade. This will help patient to have more stable blood glucose profile and potentially avoid unintended weight gain.  The following Lifestyle Medicine recommendations according to American College of Lifestyle Medicine  [facility]) were discussed and and offered to patient and she  agrees to start the journey:  A. Whole Foods, Plant-Based Nutrition comprising of fruits and vegetables, plant-based proteins, whole-grain carbohydrates was discussed in detail with the patient.   A list for source of those nutrients were also provided to the patient.  Patient will use only water or unsweetened tea for hydration. B.  The need to stay away from risky substances including alcohol, smoking; obtaining 7 to 9 hours of restorative  sleep, at least 150 minutes of moderate intensity exercise weekly, the importance of healthy social connections,  and stress management techniques were discussed. C.  A full color page of  Calorie density of various food groups per pound showing examples of each food groups was provided to the patient.   - she has been scheduled with Norm Salt, RDN, CDE for diabetes education.  - I have approached her with the following individualized plan to manage  her diabetes and patient agrees:   -In light of her presentation with near target glycemic profile, she will not need prandial insulin for now.  She is advised to continue Tresiba 60 units nightly, continue monitoring blood glucose twice a day-daily before breakfast and at bedtime.     -She did not want to get on a CGM.    - she is encouraged to call clinic for blood glucose levels less than 70 or above 200 mg /dl. -Whole Foods plant predominant lifestyle nutrition was discussed and recommended to her. -She is benefiting and tolerating Jardiance at a low dose.  She is advised to continue Jardiance 10 mg p.o. daily at breakfast.  Side effects and precautions discussed with her.    - Specific targets for  A1c;  LDL, HDL,  and Triglycerides were discussed with the patient.  2) Blood Pressure /Hypertension:  -Her blood pressure is controlled to target.  She has a slightly above target urinary microalbumin.  she is advised to continue her current medications including losartan/HCTZ at 50/12.5 mg p.o. daily.  This medication was adjusted from a higher dose of 100/25 mg.  She will be considered for low-dose of this medication on her next refills.   3) Lipids/Hyperlipidemia: Review of her previsit labs show controlled LDL at 71.  She is advised to continue Crestor 10 mg p.o. nightly.     The above mentioned whole food plant-based diet plan will also help with dyslipidemia.     4)  Weight/Diet:  Body mass index is 36.15 kg/m.  -   clearly  complicating her diabetes care.   she is  a candidate for weight loss. I discussed with her the fact that loss of 5 - 10% of her  current body weight will have the most impact on her diabetes management.  Exercise, and detailed carbohydrates information provided  -  detailed on discharge instructions.  5) Chronic Care/Health Maintenance:  -she  ison ACEI/ARB and Statin medications and  is encouraged to initiate and continue to follow up with Ophthalmology, Dentist,  Podiatrist at least yearly or according to recommendations, and advised to  stay away from smoking. I have recommended yearly flu vaccine and pneumonia vaccine at least every 5 years; moderate intensity exercise for up to 150 minutes weekly; and  sleep for at least 7 hours  a day. She is advised to stay on vitamin D3 2000 units p.o. daily. Her screening ABI was negative for PAD in February 2022.  This study will be repeated in February 2027, or sooner if needed.   - she is  advised to maintain close follow up with Assunta Found, MD for primary care needs, as well as her other providers for optimal and coordinated care.   I spent  26  minutes in the care of the patient today including review of labs from CMP, Lipids, Thyroid Function, Hematology (current and previous including abstractions from other facilities); face-to-face time discussing  her blood glucose readings/logs, discussing hypoglycemia and hyperglycemia episodes and symptoms, medications doses, her options of short and long term treatment based on the latest standards of care / guidelines;  discussion about incorporating lifestyle medicine;  and documenting the encounter. Risk reduction counseling performed per USPSTF guidelines to reduce  obesity and cardiovascular risk factors.     Please refer to Patient Instructions for Blood Glucose Monitoring and Insulin/Medications Dosing Guide"  in media tab for additional information. Please  also refer to " Patient Self Inventory" in  the Media  tab for reviewed elements of pertinent patient history.  Thompson Caul Bake participated in the discussions, expressed understanding, and voiced agreement with the above plans.  All questions were answered to her satisfaction. she is encouraged to contact clinic should she have any questions or concerns prior to her return visit.   Follow up plan: - Return in about 6 months (around 01/23/2024) for Bring Meter/CGM Device/Logs- A1c in Office.  Marquis Lunch, MD [facility] [address] [place], [place] [address] Phone: [phone]  Fax: [phone]    07/25/2023, 11:14 AM  This note was partially dictated with voice recognition software. Similar sounding words can be transcribed inadequately or may not  be corrected upon review.

## 2023-07-26 ENCOUNTER — Other Ambulatory Visit: Payer: Self-pay | Admitting: "Endocrinology

## 2023-08-31 ENCOUNTER — Ambulatory Visit: Payer: BC Managed Care – PPO | Admitting: Nurse Practitioner

## 2023-09-11 ENCOUNTER — Telehealth: Payer: Self-pay | Admitting: Nurse Practitioner

## 2023-09-11 ENCOUNTER — Ambulatory Visit
Admission: RE | Admit: 2023-09-11 | Discharge: 2023-09-11 | Disposition: A | Discharge status: Home / Self Care | Payer: BC Managed Care – PPO | Source: Ambulatory Visit | Attending: Nurse Practitioner | Admitting: Nurse Practitioner

## 2023-09-11 ENCOUNTER — Ambulatory Visit: Payer: BC Managed Care – PPO

## 2023-09-11 VITALS — BP 107/68 | HR 81 | Temp 98.3°F | Resp 18

## 2023-09-11 DIAGNOSIS — M25541 Pain in joints of right hand: Secondary | ICD-10-CM

## 2023-09-11 DIAGNOSIS — M7989 Other specified soft tissue disorders: Secondary | ICD-10-CM | POA: Diagnosis not present

## 2023-09-11 DIAGNOSIS — M151 Heberden's nodes (with arthropathy): Secondary | ICD-10-CM | POA: Diagnosis not present

## 2023-09-11 DIAGNOSIS — M79644 Pain in right finger(s): Secondary | ICD-10-CM | POA: Diagnosis not present

## 2023-09-11 MED ORDER — DEXAMETHASONE SODIUM PHOSPHATE 10 MG/ML IJ SOLN
10.0000 mg | INTRAMUSCULAR | Status: AC
  Administered 2023-09-11: 10 mg via INTRAMUSCULAR

## 2023-09-11 MED ORDER — DICLOFENAC SODIUM 1 % EX GEL: 4.0000 g | Freq: Four times a day (QID) | CUTANEOUS | 0 refills | Status: DC

## 2023-09-11 NOTE — ED Provider Notes (Signed)
[facility]    CSN: [id number] Arrival date & time: 09/11/23  0909      History   Chief Complaint Chief Complaint  Patient presents with   Finger Injury    Also discuss possible yeast infection/Jardiance reaction - Entered by patient    HPI Victoria Williams is a 57 y.o. female.   The history is provided by the patient.   Patient presents for complaints of pain and swelling to the right index finger that is been present for the past 3 weeks.  Patient does not recall any specific injury or trauma; however, she does recall trying to crank her lawn equipment.  She states that she may have caught the finger when she was trying to pull the crank.  Patient states that pain worsens in the tip of her finger when she is trying to open or close something.  She states that sometimes the pain does radiate down into the base of the right index finger.  She states that it does feel "tight" from time to time or like it is "asleep.".  Patient reports she has been using ice to the affected area with minimal relief.  Past Medical History:  Diagnosis Date   Arthritis    Bell's palsy    approx 2015   Chronic kidney disease    Diabetes mellitus    only when on steriods   Esophagitis 2012   EGD   Family history of adverse reaction to anesthesia    PONV and hallucinations-MOM   Henoch-Schonlein purpura (HCC)    History of kidney stones    History of UTI    Hypertension    IBS (irritable bowel syndrome)    Obesity    Sleep apnea     Patient Active Problem List   Diagnosis Date Noted   Class 2 severe obesity due to excess calories with serious comorbidity and body mass index (BMI) of 37.0 to 37.9 in adult (HCC) 06/21/2022   Vitamin D deficiency 08/26/2020   Essential hypertension, benign 08/26/2020   Uncontrolled type 2 diabetes mellitus with hyperglycemia (HCC) 07/01/2020   Mixed hyperlipidemia 07/01/2020   Constipation 02/17/2020   History of adenomatous polyp of colon  02/17/2020   Gallbladder sludge 12/17/2019   Facial paralysis/Bells palsy 01/27/2014   Facial droop 01/26/2014   Facial weakness 01/26/2014   Bloating 09/30/2013   Loss of weight 09/30/2013   Abdominal pain, other specified site 09/30/2013   Abdominal pain, epigastric 02/10/2011   Henoch-Schonlein purpura (HCC) 02/10/2011   Diabetes mellitus without mention of complication 02/10/2011    Past Surgical History:  Procedure Laterality Date   ABDOMINAL HYSTERECTOMY  2003   ACHILLES TENDON SURGERY Left    CESAREAN SECTION     CHOLECYSTECTOMY N/A 01/03/2020   Procedure: LAPAROSCOPIC CHOLECYSTECTOMY;  Surgeon: Lucretia Roers, MD;  Location: [facility];  Service: General;  Laterality: N/A;   KNEE SURGERY Left    arthroscopic   NECK SURGERY  2000   cervical fusion 6/7   REPLACEMENT TOTAL KNEE Left 2019   [facility]   TONSILECTOMY, ADENOIDECTOMY, BILATERAL MYRINGOTOMY AND TUBES     TONSILLECTOMY     TUBAL LIGATION  1996    OB History   No obstetric history on file.      Home Medications    Prior to Admission medications   Medication Sig Start Date End Date Taking? Authorizing Provider  diclofenac Sodium (VOLTAREN ARTHRITIS PAIN) 1 % GEL Apply 4 g topically 4 (four) times daily.  09/11/23  Yes Leath-Warren, Sadie Haber, NP  Accu-Chek Softclix Lancets lancets Use as instructed 01/05/23   Roma Kayser, MD  acetaminophen (TYLENOL) 500 MG tablet Take 1,000 mg by mouth 2 (two) times daily as needed for headache.    [provider]  B-D ULTRAFINE III SHORT PEN 31G X 8 MM MISC USE ONCE DAILY 05/26/23   Roma Kayser, MD  Blood Glucose Monitoring Suppl (ACCU-CHEK GUIDE ME) w/Device KIT 1 Piece by Does not apply route as directed. 09/22/22   Roma Kayser, MD  calcium carbonate (TUMS - DOSED IN MG ELEMENTAL CALCIUM) 500 MG chewable tablet Chew 1 tablet by mouth daily as needed for indigestion or heartburn.    [provider]  cholecalciferol (VITAMIN D3) 25  MCG (1000 UNIT) tablet Take 2,000 Units by mouth daily.    [provider]  dicyclomine (BENTYL) 10 MG capsule Take 10 mg by mouth 2 (two) times daily.  03/14/18   [provider]  glucose blood (ACCU-CHEK GUIDE) test strip USE AS DIRECTED qid. Dx E11.65 03/07/23   Roma Kayser, MD  ibuprofen (ADVIL,MOTRIN) 200 MG tablet Take 600 mg by mouth 2 (two) times daily as needed for moderate pain.    [provider]  insulin degludec (TRESIBA FLEXTOUCH) 100 UNIT/ML FlexTouch Pen ADMINISTER 60 UNITS UNDER THE SKIN AT BEDTIME 07/25/23   Nida, Denman George, MD  JARDIANCE 10 MG TABS tablet Take 1 tablet (10 mg total) by mouth daily. 07/25/23   Roma Kayser, MD  losartan-hydrochlorothiazide (HYZAAR) 50-12.5 MG tablet Take 1 tablet by mouth daily. 09/22/22   Roma Kayser, MD  rosuvastatin (CRESTOR) 10 MG tablet TAKE 1 TABLET(10 MG) BY MOUTH DAILY 07/26/23   Roma Kayser, MD  tolterodine (DETROL) 2 MG tablet Take 2 mg by mouth 2 (two) times daily.    [provider]    Family History Family History  Problem Relation Age of Onset   Leukemia Mother    Diabetes Mother    Breast cancer Mother    Hypertension Mother    Stroke Mother    Esophageal cancer Brother    Diabetes Maternal Aunt    Diabetes Maternal Uncle    Diabetes Maternal Grandfather    Heart attack Father    Heart failure Father    Colon cancer Neg Hx     Social History Social History   Tobacco Use   Smoking status: Never   Smokeless tobacco: Never  Vaping Use   Vaping status: Never Used  Substance Use Topics   Alcohol use: Yes    Alcohol/week: 1.0 standard drink of alcohol    Types: 1 Glasses of wine per week    Comment: 2 a month    Drug use: No     Allergies   Hydromorphone hcl, Sulfa antibiotics, Cephalexin, Ciprocin-fluocin-procin [fluocinolone acetonide], Bee venom, Levofloxacin, Celebrex [celecoxib], and Penicillins   Review of Systems Review of  Systems Per HPI  Physical Exam Triage Vital Signs ED Triage Vitals  Encounter Vitals Group     BP 09/11/23 1000 107/68     Systolic BP Percentile --      Diastolic BP Percentile --      Pulse Rate 09/11/23 1000 81     Resp 09/11/23 1000 18     Temp 09/11/23 1000 98.3 F (36.8 C)     Temp Source 09/11/23 1000 Oral     SpO2 09/11/23 1000 93 %     Weight --  Height --      Head Circumference --      Peak Flow --      Pain Score 09/11/23 1005 5     Pain Loc --      Pain Education --      Exclude from Growth Chart --    No data found.  Updated Vital Signs BP 107/68 (BP Location: Right Arm)   Pulse 81   Temp 98.3 F (36.8 C) (Oral)   Resp 18   SpO2 93%   Visual Acuity Right Eye Distance:   Left Eye Distance:   Bilateral Distance:    Right Eye Near:   Left Eye Near:    Bilateral Near:     Physical Exam Vitals and nursing note reviewed.  Constitutional:      General: She is not in acute distress.    Appearance: Normal appearance.  HENT:     Head: Normocephalic.  Eyes:     Extraocular Movements: Extraocular movements intact.     Pupils: Pupils are equal, round, and reactive to light.  Pulmonary:     Effort: Pulmonary effort is normal.  Musculoskeletal:     Right hand: Swelling (Swelling to the DIP joint of the right index finger.) and tenderness (DIP joint of right index finger) present. No deformity. Decreased range of motion (Right index finger). Decreased strength (Right index finger). Normal capillary refill. Normal pulse.     Cervical back: Normal range of motion.  Skin:    General: Skin is warm and dry.  Neurological:     General: No focal deficit present.     Mental Status: She is alert and oriented to person, place, and time.  Psychiatric:        Mood and Affect: Mood normal.        Behavior: Behavior normal.      UC Treatments / Results  Labs (all labs ordered are listed, but only abnormal results are displayed) Labs Reviewed - No data to  display  EKG   Radiology No results found.  Procedures Procedures (including critical care time)  Medications Ordered in UC Medications  dexamethasone (DECADRON) injection 10 mg (10 mg Intramuscular Given 09/11/23 1049)    Initial Impression / Assessment and Plan / UC Course  I have reviewed the triage vital signs and the nursing notes.  Pertinent labs & imaging results that were available during my care of the patient were reviewed by me and considered in my medical decision making (see chart for details).  X-ray of the right index finger is pending.  Will place finger splint to provide compression and support to the right index finger.  Decadron 10 mg IM administered for joint pain and swelling.  Voltaren gel prescribed for finger pain.  Supportive care recommendations were provided and discussed with the patient to include over-the-counter Tylenol, applying ice, and exercises using the right index finger.  Patient was advised if symptoms do not improve with this treatment, would like for her to follow-up with orthopedics for further evaluation.  Patient reports she does have an orthopedic office that she states.  Patient is in agreement with this plan of care and verbalizes understanding.  All questions were answered.  Patient stable for discharge.  Final Clinical Impressions(s) / UC Diagnoses   Final diagnoses:  Pain involving joint of finger of right hand     Discharge Instructions      X-ray of the right index finger is pending.  You will be contacted only  if the x-ray result is abnormal.  You will also have access to results via MyChart. You have been given an injection of Decadron 10 mg.  Please monitor your blood glucose levels as the medication can cause your blood glucose to rise.  If your blood glucose exceeds 450, please go to the emergency department immediately. RICE therapy, rest, ice, compression, and elevation.  Apply ice for 20 minutes, remove for 1 hour, repeat  as needed. A splint has been provided to allow for additional support and compression.  Do not wear the splint consistently, remove the splint and exercise the right index finger to prevent joint immobility.  You may wear the splint when you are engaged in prolonged or strenuous activity. As discussed, if symptoms do not improve with this treatment, it is recommended that you follow-up with orthopedics for further evaluation. Follow-up as needed.     ED Prescriptions     Medication Sig Dispense Auth. Provider   diclofenac Sodium (VOLTAREN ARTHRITIS PAIN) 1 % GEL Apply 4 g topically 4 (four) times daily. 150 g Leath-Warren, Sadie Haber, NP      PDMP not reviewed this encounter.   Abran Cantor, NP 09/11/23 1056

## 2023-09-11 NOTE — ED Triage Notes (Signed)
Pt reports right hand index finger swollen, x 3 weeks  throbbing pain unable to use the finger to open bottles, using ibuprofen for discomfort. Pt states she is unsure of how she injured the finger.

## 2023-09-11 NOTE — Discharge Instructions (Addendum)
X-ray of the right index finger is pending.  You will be contacted only if the x-ray result is abnormal.  You will also have access to results via MyChart. You have been given an injection of Decadron 10 mg.  Please monitor your blood glucose levels as the medication can cause your blood glucose to rise.  If your blood glucose exceeds 450, please go to the emergency department immediately. RICE therapy, rest, ice, compression, and elevation.  Apply ice for 20 minutes, remove for 1 hour, repeat as needed. A splint has been provided to allow for additional support and compression.  Do not wear the splint consistently, remove the splint and exercise the right index finger to prevent joint immobility.  You may wear the splint when you are engaged in prolonged or strenuous activity. As discussed, if symptoms do not improve with this treatment, it is recommended that you follow-up with orthopedics for further evaluation. Follow-up as needed.

## 2023-09-11 NOTE — Telephone Encounter (Signed)
Call patient to discuss x-ray result.  Spoke with patient, verified 2 patient identifiers.  Informed patient that x-ray does show osteoarthritis of the second DIP joint of the right index finger.  Patient advised we will continue with current plan of care and recommended treatment.  Patient was advised to follow-up with her PCP if symptoms fail to improve.  Patient was in agreement with this plan of care and verbalized understanding.  All questions were answered.

## 2023-09-27 DIAGNOSIS — E6609 Other obesity due to excess calories: Secondary | ICD-10-CM | POA: Diagnosis not present

## 2023-09-27 DIAGNOSIS — M159 Polyosteoarthritis, unspecified: Secondary | ICD-10-CM | POA: Diagnosis not present

## 2023-09-27 DIAGNOSIS — Z6837 Body mass index (BMI) 37.0-37.9, adult: Secondary | ICD-10-CM | POA: Diagnosis not present

## 2023-10-04 DIAGNOSIS — E119 Type 2 diabetes mellitus without complications: Secondary | ICD-10-CM | POA: Diagnosis not present

## 2023-10-04 DIAGNOSIS — N390 Urinary tract infection, site not specified: Secondary | ICD-10-CM | POA: Diagnosis not present

## 2023-10-04 DIAGNOSIS — R350 Frequency of micturition: Secondary | ICD-10-CM | POA: Diagnosis not present

## 2023-10-04 DIAGNOSIS — Z862 Personal history of diseases of the blood and blood-forming organs and certain disorders involving the immune mechanism: Secondary | ICD-10-CM | POA: Diagnosis not present

## 2023-10-10 DIAGNOSIS — G4733 Obstructive sleep apnea (adult) (pediatric): Secondary | ICD-10-CM | POA: Diagnosis not present

## 2023-10-23 ENCOUNTER — Other Ambulatory Visit: Payer: Self-pay | Admitting: "Endocrinology

## 2023-10-29 ENCOUNTER — Other Ambulatory Visit: Payer: Self-pay | Admitting: "Endocrinology

## 2023-11-10 DIAGNOSIS — G4733 Obstructive sleep apnea (adult) (pediatric): Secondary | ICD-10-CM | POA: Diagnosis not present

## 2023-12-11 DIAGNOSIS — G4733 Obstructive sleep apnea (adult) (pediatric): Secondary | ICD-10-CM | POA: Diagnosis not present

## 2023-12-23 ENCOUNTER — Emergency Department (HOSPITAL_COMMUNITY)
Admission: EM | Admit: 2023-12-23 | Discharge: 2023-12-23 | Disposition: A | Discharge status: Home / Self Care | Attending: Emergency Medicine | Admitting: Emergency Medicine

## 2023-12-23 ENCOUNTER — Other Ambulatory Visit: Payer: Self-pay

## 2023-12-23 ENCOUNTER — Emergency Department (HOSPITAL_COMMUNITY)

## 2023-12-23 ENCOUNTER — Encounter (HOSPITAL_COMMUNITY): Payer: Self-pay | Admitting: Emergency Medicine

## 2023-12-23 DIAGNOSIS — Z981 Arthrodesis status: Secondary | ICD-10-CM | POA: Diagnosis not present

## 2023-12-23 DIAGNOSIS — R109 Unspecified abdominal pain: Secondary | ICD-10-CM | POA: Diagnosis not present

## 2023-12-23 DIAGNOSIS — M549 Dorsalgia, unspecified: Secondary | ICD-10-CM | POA: Diagnosis not present

## 2023-12-23 DIAGNOSIS — M545 Low back pain, unspecified: Secondary | ICD-10-CM | POA: Diagnosis not present

## 2023-12-23 DIAGNOSIS — N200 Calculus of kidney: Secondary | ICD-10-CM | POA: Diagnosis not present

## 2023-12-23 DIAGNOSIS — R7309 Other abnormal glucose: Secondary | ICD-10-CM | POA: Insufficient documentation

## 2023-12-23 DIAGNOSIS — N201 Calculus of ureter: Secondary | ICD-10-CM | POA: Diagnosis not present

## 2023-12-23 LAB — CBC WITH DIFFERENTIAL/PLATELET
Abs Immature Granulocytes: 0.03 10*3/uL (ref 0.00–0.07)
Basophils Absolute: 0.1 10*3/uL (ref 0.0–0.1)
Basophils Relative: 1 %
Eosinophils Absolute: 0.2 10*3/uL (ref 0.0–0.5)
Eosinophils Relative: 2 %
HCT: 43.7 % (ref 36.0–46.0)
Hemoglobin: 14.3 g/dL (ref 12.0–15.0)
Immature Granulocytes: 0 %
Lymphocytes Relative: 29 %
Lymphs Abs: 2.8 10*3/uL (ref 0.7–4.0)
MCH: 29.7 pg (ref 26.0–34.0)
MCHC: 32.7 g/dL (ref 30.0–36.0)
MCV: 90.9 fL (ref 80.0–100.0)
Monocytes Absolute: 0.9 10*3/uL (ref 0.1–1.0)
Monocytes Relative: 9 %
Neutro Abs: 5.7 10*3/uL (ref 1.7–7.7)
Neutrophils Relative %: 59 %
Platelets: 317 10*3/uL (ref 150–400)
RBC: 4.81 MIL/uL (ref 3.87–5.11)
RDW: 13.6 % (ref 11.5–15.5)
WBC: 9.6 10*3/uL (ref 4.0–10.5)
nRBC: 0 % (ref 0.0–0.2)

## 2023-12-23 LAB — COMPREHENSIVE METABOLIC PANEL
ALT: 18 U/L (ref 0–44)
AST: 17 U/L (ref 15–41)
Albumin: 3.7 g/dL (ref 3.5–5.0)
Alkaline Phosphatase: 56 U/L (ref 38–126)
Anion gap: 10 (ref 5–15)
BUN: 20 mg/dL (ref 6–20)
CO2: 23 mmol/L (ref 22–32)
Calcium: 9.2 mg/dL (ref 8.9–10.3)
Chloride: 106 mmol/L (ref 98–111)
Creatinine, Ser: 0.82 mg/dL (ref 0.44–1.00)
GFR, Estimated: >60 mL/min (ref >60)
Glucose, Bld: 150 mg/dL — ABNORMAL HIGH (ref 70–99)
Potassium: 3.2 mmol/L — ABNORMAL LOW (ref 3.5–5.1)
Sodium: 139 mmol/L (ref 135–145)
Total Bilirubin: 0.7 mg/dL (ref 0.0–1.2)
Total Protein: 7.2 g/dL (ref 6.5–8.1)

## 2023-12-23 LAB — URINALYSIS, ROUTINE W REFLEX MICROSCOPIC
Bilirubin Urine: NEGATIVE
Glucose, UA: >=500 mg/dL — AB
Hgb urine dipstick: NEGATIVE
Ketones, ur: NEGATIVE mg/dL
Leukocytes,Ua: NEGATIVE
Nitrite: POSITIVE — AB
Protein, ur: NEGATIVE mg/dL
Specific Gravity, Urine: 1.028 (ref 1.005–1.030)
pH: 6 (ref 5.0–8.0)

## 2023-12-23 LAB — CBG MONITORING, ED: Glucose-Capillary: 116 mg/dL — ABNORMAL HIGH (ref 70–99)

## 2023-12-23 MED ORDER — OXYCODONE-ACETAMINOPHEN 5-325 MG PO TABS: 1.0000 | ORAL_TABLET | Freq: Four times a day (QID) | ORAL | 0 refills | Status: DC | PRN

## 2023-12-23 MED ORDER — ONDANSETRON 4 MG PO TBDP: ORAL_TABLET | ORAL | 0 refills | Status: DC

## 2023-12-23 MED ORDER — ONDANSETRON 4 MG PO TBDP
4.0000 mg | ORAL_TABLET | Freq: Once | ORAL | Status: AC
  Administered 2023-12-23: 4 mg via ORAL
  Filled 2023-12-23: qty 1

## 2023-12-23 MED ORDER — SULFAMETHOXAZOLE-TRIMETHOPRIM 800-160 MG PO TABS: 1.0000 | ORAL_TABLET | Freq: Two times a day (BID) | ORAL | 0 refills | Status: AC

## 2023-12-23 MED ORDER — OXYCODONE-ACETAMINOPHEN 5-325 MG PO TABS
2.0000 | ORAL_TABLET | Freq: Once | ORAL | Status: AC
  Administered 2023-12-23: 2 via ORAL
  Filled 2023-12-23: qty 2

## 2023-12-23 NOTE — ED Provider Notes (Signed)
 Patient with a 6 x 3 mm left ureteral stone and mild urinary tract infection.  I spoke with urology and since the patient is nontoxic we will send her home with Bactrim, Percocet, Zofran and the patient will follow-up with urology this week   Bethann Berkshire, MD 12/23/23 [phone]

## 2023-12-23 NOTE — ED Triage Notes (Signed)
 Pt here for c/o back pain. Pt states she sustained a fall on her L side on the 15th and she is unsure whether the pain is from the fall or "if she has a kidney problem". Pt with hx of kidney stones. Points to L flank area for pain.

## 2023-12-23 NOTE — ED Notes (Signed)
 Patient Alert and oriented to baseline. Stable and ambulatory to baseline. Patient verbalized understanding of the discharge instructions.  Patient belongings were taken by the patient.

## 2023-12-23 NOTE — Discharge Instructions (Signed)
Follow up with [facility] next week

## 2023-12-23 NOTE — ED Provider Notes (Signed)
 [place] [facility] AT [facility] Provider Note   CSN: [id number] Arrival date & time: 12/23/23  0545     History  Chief Complaint  Patient presents with   Back Pain    Victoria Williams is a 58 y.o. female.  58 yo F here with left lower back pain. Started yesterday evening and progressively worsened until today. Had a fall a couple weeks ago but pain had resolved from that for a few days prior to this. She has a history of kidney stoens and did notice some dark urine this AM so wonders if this could be another stone. No fevers. No dysuria. No saddle anesthesia, leg weakness or incontinence. States that twisting, walking and certain movements do make it worse. No rash.    Back Pain      Home Medications Prior to Admission medications   Medication Sig Start Date End Date Taking? Authorizing Provider  ACCU-CHEK GUIDE TEST test strip USE TO TEST BLOOD SUGAR FOUR TIMES DAILY AS DIRECTED. 10/23/23   Roma Kayser, MD  Accu-Chek Softclix Lancets lancets Use as instructed 01/05/23   Roma Kayser, MD  acetaminophen (TYLENOL) 500 MG tablet Take 1,000 mg by mouth 2 (two) times daily as needed for headache.    [provider]  B-D ULTRAFINE III SHORT PEN 31G X 8 MM MISC USE ONCE DAILY 05/26/23   Roma Kayser, MD  Blood Glucose Monitoring Suppl (ACCU-CHEK GUIDE ME) w/Device KIT 1 Piece by Does not apply route as directed. 09/22/22   Roma Kayser, MD  calcium carbonate (TUMS - DOSED IN MG ELEMENTAL CALCIUM) 500 MG chewable tablet Chew 1 tablet by mouth daily as needed for indigestion or heartburn.    [provider]  cholecalciferol (VITAMIN D3) 25 MCG (1000 UNIT) tablet Take 2,000 Units by mouth daily.    [provider]  diclofenac Sodium (VOLTAREN ARTHRITIS PAIN) 1 % GEL Apply 4 g topically 4 (four) times daily. 09/11/23   Leath-Warren, Sadie Haber, NP  dicyclomine (BENTYL) 10 MG capsule Take 10 mg by mouth 2 (two)  times daily.  03/14/18   [provider]  ibuprofen (ADVIL,MOTRIN) 200 MG tablet Take 600 mg by mouth 2 (two) times daily as needed for moderate pain.    [provider]  insulin degludec (TRESIBA FLEXTOUCH) 100 UNIT/ML FlexTouch Pen ADMINISTER 60 UNITS UNDER THE SKIN AT BEDTIME 07/25/23   Nida, Denman George, MD  JARDIANCE 10 MG TABS tablet Take 1 tablet (10 mg total) by mouth daily. 07/25/23   Roma Kayser, MD  losartan-hydrochlorothiazide (HYZAAR) 50-12.5 MG tablet Take 1 tablet by mouth daily. 09/22/22   Roma Kayser, MD  rosuvastatin (CRESTOR) 10 MG tablet TAKE 1 TABLET(10 MG) BY MOUTH DAILY 10/31/23   Roma Kayser, MD  tolterodine (DETROL) 2 MG tablet Take 2 mg by mouth 2 (two) times daily.    [provider]      Allergies    Hydromorphone hcl, Sulfa antibiotics, Cephalexin, Ciprocin-fluocin-procin [fluocinolone acetonide], Bee venom, Levofloxacin, Celebrex [celecoxib], and Penicillins    Review of Systems   Review of Systems  Musculoskeletal:  Positive for back pain.    Physical Exam Updated Vital Signs BP 112/71   Pulse 81   Temp 97.7 F (36.5 C) (Oral)   Resp 16   Ht 5\' 4"  (1.626 m)   Wt 95.3 kg   SpO2 90%   BMI 36.05 kg/m  Physical Exam Vitals and nursing note reviewed.  Constitutional:      Appearance: She is well-developed.  HENT:     Head: Normocephalic and atraumatic.     Nose: No congestion or rhinorrhea.  Cardiovascular:     Rate and Rhythm: Normal rate and regular rhythm.  Pulmonary:     Effort: No respiratory distress.     Breath sounds: No stridor.  Abdominal:     General: There is no distension.  Musculoskeletal:        General: Tenderness (very minimal in lumbar paraspinal and obliques/pelvis in same area) present.     Cervical back: Normal range of motion.  Skin:    Findings: No rash.  Neurological:     Mental Status: She is alert.     ED Results / Procedures / Treatments   Labs (all labs  ordered are listed, but only abnormal results are displayed) Labs Reviewed  URINALYSIS, ROUTINE W REFLEX MICROSCOPIC - Abnormal; Notable for the following components:      Result Value   Glucose, UA >=500 (*)    Nitrite POSITIVE (*)    Bacteria, UA RARE (*)    All other components within normal limits  COMPREHENSIVE METABOLIC PANEL - Abnormal; Notable for the following components:   Potassium 3.2 (*)    Glucose, Bld 150 (*)    All other components within normal limits  CBG MONITORING, ED - Abnormal; Notable for the following components:   Glucose-Capillary 116 (*)    All other components within normal limits  URINE CULTURE  CBC WITH DIFFERENTIAL/PLATELET    EKG None  Radiology No results found.  Procedures Procedures    Medications Ordered in ED Medications  oxyCODONE-acetaminophen (PERCOCET/ROXICET) 5-325 MG per tablet 2 tablet (2 tablets Oral Given 12/23/23 0620)    ED Course/ Medical Decision Making/ A&P                                 Medical Decision Making Amount and/or Complexity of Data Reviewed Labs: ordered. Radiology: ordered.  Risk Prescription drug management.   Stone vs MSK. Slightly low O2, will check cxr as well.  Labs reassuring aside from possible UTI. Pending CT scan to eval further for possible pyelo especially with the back pain. Care transferred pending same.    Final Clinical Impression(s) / ED Diagnoses Final diagnoses:  None    Rx / DC Orders ED Discharge Orders     None         Kenasia Scheller, Barbara Cower, MD 12/23/23 2304

## 2023-12-26 DIAGNOSIS — N3000 Acute cystitis without hematuria: Secondary | ICD-10-CM | POA: Diagnosis not present

## 2023-12-26 DIAGNOSIS — N201 Calculus of ureter: Secondary | ICD-10-CM | POA: Diagnosis not present

## 2023-12-26 DIAGNOSIS — N3946 Mixed incontinence: Secondary | ICD-10-CM | POA: Diagnosis not present

## 2023-12-26 LAB — URINE CULTURE: Culture: >=100000 — AB

## 2023-12-28 DIAGNOSIS — M7542 Impingement syndrome of left shoulder: Secondary | ICD-10-CM | POA: Diagnosis not present

## 2024-01-04 ENCOUNTER — Other Ambulatory Visit: Payer: Self-pay | Admitting: Obstetrics and Gynecology

## 2024-01-04 DIAGNOSIS — Z1231 Encounter for screening mammogram for malignant neoplasm of breast: Secondary | ICD-10-CM

## 2024-01-07 ENCOUNTER — Emergency Department (HOSPITAL_COMMUNITY)

## 2024-01-07 ENCOUNTER — Emergency Department (HOSPITAL_COMMUNITY)
Admission: EM | Admit: 2024-01-07 | Discharge: 2024-01-07 | Disposition: A | Discharge status: Home / Self Care | Attending: Emergency Medicine | Admitting: Emergency Medicine

## 2024-01-07 DIAGNOSIS — N201 Calculus of ureter: Secondary | ICD-10-CM | POA: Insufficient documentation

## 2024-01-07 DIAGNOSIS — I10 Essential (primary) hypertension: Secondary | ICD-10-CM | POA: Diagnosis not present

## 2024-01-07 DIAGNOSIS — K5730 Diverticulosis of large intestine without perforation or abscess without bleeding: Secondary | ICD-10-CM | POA: Diagnosis not present

## 2024-01-07 DIAGNOSIS — R109 Unspecified abdominal pain: Secondary | ICD-10-CM | POA: Diagnosis not present

## 2024-01-07 DIAGNOSIS — E119 Type 2 diabetes mellitus without complications: Secondary | ICD-10-CM | POA: Diagnosis not present

## 2024-01-07 DIAGNOSIS — Z794 Long term (current) use of insulin: Secondary | ICD-10-CM | POA: Diagnosis not present

## 2024-01-07 DIAGNOSIS — Z79899 Other long term (current) drug therapy: Secondary | ICD-10-CM | POA: Insufficient documentation

## 2024-01-07 LAB — BASIC METABOLIC PANEL
Anion gap: 9 (ref 5–15)
BUN: 32 mg/dL — ABNORMAL HIGH (ref 6–20)
CO2: 21 mmol/L — ABNORMAL LOW (ref 22–32)
Calcium: 9 mg/dL (ref 8.9–10.3)
Chloride: 106 mmol/L (ref 98–111)
Creatinine, Ser: 1.05 mg/dL — ABNORMAL HIGH (ref 0.44–1.00)
GFR, Estimated: >60 mL/min (ref >60)
Glucose, Bld: 136 mg/dL — ABNORMAL HIGH (ref 70–99)
Potassium: 4.1 mmol/L (ref 3.5–5.1)
Sodium: 136 mmol/L (ref 135–145)

## 2024-01-07 LAB — CBC WITH DIFFERENTIAL/PLATELET
Abs Immature Granulocytes: 0.08 10*3/uL — ABNORMAL HIGH (ref 0.00–0.07)
Basophils Absolute: 0.1 10*3/uL (ref 0.0–0.1)
Basophils Relative: 0 %
Eosinophils Absolute: 0.1 10*3/uL (ref 0.0–0.5)
Eosinophils Relative: 0 %
HCT: 43.4 % (ref 36.0–46.0)
Hemoglobin: 13.9 g/dL (ref 12.0–15.0)
Immature Granulocytes: 1 %
Lymphocytes Relative: 26 %
Lymphs Abs: 3.8 10*3/uL (ref 0.7–4.0)
MCH: 29.4 pg (ref 26.0–34.0)
MCHC: 32 g/dL (ref 30.0–36.0)
MCV: 91.8 fL (ref 80.0–100.0)
Monocytes Absolute: 1.1 10*3/uL — ABNORMAL HIGH (ref 0.1–1.0)
Monocytes Relative: 8 %
Neutro Abs: 9.5 10*3/uL — ABNORMAL HIGH (ref 1.7–7.7)
Neutrophils Relative %: 65 %
Platelets: 381 10*3/uL (ref 150–400)
RBC: 4.73 MIL/uL (ref 3.87–5.11)
RDW: 13.8 % (ref 11.5–15.5)
WBC: 14.6 10*3/uL — ABNORMAL HIGH (ref 4.0–10.5)
nRBC: 0 % (ref 0.0–0.2)

## 2024-01-07 LAB — URINALYSIS, ROUTINE W REFLEX MICROSCOPIC
Bacteria, UA: NONE SEEN
Bilirubin Urine: NEGATIVE
Glucose, UA: >=500 mg/dL — AB
Hgb urine dipstick: NEGATIVE
Ketones, ur: NEGATIVE mg/dL
Leukocytes,Ua: NEGATIVE
Nitrite: NEGATIVE
Protein, ur: NEGATIVE mg/dL
Specific Gravity, Urine: 1.031 — ABNORMAL HIGH (ref 1.005–1.030)
pH: 5 (ref 5.0–8.0)

## 2024-01-07 MED ORDER — MORPHINE SULFATE (PF) 2 MG/ML IV SOLN
2.0000 mg | Freq: Once | INTRAVENOUS | Status: AC
  Administered 2024-01-07: 2 mg via INTRAVENOUS
  Filled 2024-01-07: qty 1

## 2024-01-07 MED ORDER — OXYCODONE-ACETAMINOPHEN 5-325 MG PO TABS: 1.0000 | ORAL_TABLET | Freq: Four times a day (QID) | ORAL | 0 refills | Status: DC | PRN

## 2024-01-07 NOTE — Discharge Instructions (Addendum)
 You were seen today for a kidney stone.  It is in the same location as previous but does not seem to be infected or any concern for other emergencies at this time.  Your labs and imaging are reassuring recommend continue to stay hydrated the urologist recommended that you do not eat anything after 12 AM this morning to prepare for stone removal tomorrow.  Call the [facility] office on Monday to speak to the on-call provider to schedule a kidney stone removal.  I am also sending you pain medication into the pharmacy for you to pick up at the 24-hour pharmacy with [facility] on [address].   Return to the ED for any new or worsening symptoms.

## 2024-01-07 NOTE — ED Provider Notes (Signed)
 [place] [facility] DEPARTMENT AT [facility] Provider Note   CSN: [id number] Arrival date & time: 01/07/24  1718     History  Chief Complaint  Patient presents with   Flank Pain    Victoria Williams is a 58 y.o. female.   Flank Pain  Patient is a 58 year old female presents the ED today complaining of left-sided flank pain that has been present for 2 weeks.  Had seen urology and had been given Flomax for a 6.2 mm kidney stone.  Previous medical history of several kidney stones in the past.  Has had increased pain over the last 2 days specifically and has noticed a malodorous urine.  Denies fever, fatigue, chest pain, shortness breath, abdominal pain, nausea, vomiting, diarrhea, dysuria, hematuria.     Home Medications Prior to Admission medications   Medication Sig Start Date End Date Taking? Authorizing Provider  ACCU-CHEK GUIDE TEST test strip USE TO TEST BLOOD SUGAR FOUR TIMES DAILY AS DIRECTED. 10/23/23   Roma Kayser, MD  Accu-Chek Softclix Lancets lancets Use as instructed 01/05/23   Roma Kayser, MD  acetaminophen (TYLENOL) 500 MG tablet Take 1,000 mg by mouth 2 (two) times daily as needed for headache.    [provider]  B-D ULTRAFINE III SHORT PEN 31G X 8 MM MISC USE ONCE DAILY 05/26/23   Roma Kayser, MD  Blood Glucose Monitoring Suppl (ACCU-CHEK GUIDE ME) w/Device KIT 1 Piece by Does not apply route as directed. 09/22/22   Roma Kayser, MD  calcium carbonate (TUMS - DOSED IN MG ELEMENTAL CALCIUM) 500 MG chewable tablet Chew 1 tablet by mouth daily as needed for indigestion or heartburn.    [provider]  cholecalciferol (VITAMIN D3) 25 MCG (1000 UNIT) tablet Take 2,000 Units by mouth daily.    [provider]  diclofenac Sodium (VOLTAREN ARTHRITIS PAIN) 1 % GEL Apply 4 g topically 4 (four) times daily. 09/11/23   Leath-Warren, Sadie Haber, NP  dicyclomine (BENTYL) 10 MG capsule Take 10 mg by mouth  2 (two) times daily.  03/14/18   [provider]  ibuprofen (ADVIL,MOTRIN) 200 MG tablet Take 600 mg by mouth 2 (two) times daily as needed for moderate pain.    [provider]  insulin degludec (TRESIBA FLEXTOUCH) 100 UNIT/ML FlexTouch Pen ADMINISTER 60 UNITS UNDER THE SKIN AT BEDTIME 07/25/23   Nida, Denman George, MD  JARDIANCE 10 MG TABS tablet Take 1 tablet (10 mg total) by mouth daily. 07/25/23   Roma Kayser, MD  losartan-hydrochlorothiazide (HYZAAR) 50-12.5 MG tablet Take 1 tablet by mouth daily. 09/22/22   Roma Kayser, MD  ondansetron (ZOFRAN-ODT) 4 MG disintegrating tablet 4mg  ODT q4 hours prn nausea/vomit 12/23/23   Bethann Berkshire, MD  oxyCODONE-acetaminophen (PERCOCET/ROXICET) 5-325 MG tablet Take 1 tablet by mouth every 6 (six) hours as needed for severe pain (pain score 7-10). 01/07/24   Gerhard Munch, MD  rosuvastatin (CRESTOR) 10 MG tablet TAKE 1 TABLET(10 MG) BY MOUTH DAILY 10/31/23   Roma Kayser, MD  tolterodine (DETROL) 2 MG tablet Take 2 mg by mouth 2 (two) times daily.    [provider]      Allergies    Hydromorphone hcl, Sulfa antibiotics, Cephalexin, Ciprocin-fluocin-procin [fluocinolone acetonide], Bee venom, Levofloxacin, Celebrex [celecoxib], and Penicillins    Review of Systems   Review of Systems  Genitourinary:  Positive for flank pain.  All other systems reviewed and are negative.   Physical Exam Updated Vital Signs  BP 116/70   Pulse 77   Temp 98.4 F (36.9 C) (Oral)   Resp 17   SpO2 92%  Physical Exam Vitals and nursing note reviewed.  Constitutional:      General: She is not in acute distress.    Appearance: Normal appearance. She is not ill-appearing.  HENT:     Head: Normocephalic and atraumatic.  Eyes:     General: No scleral icterus.       Right eye: No discharge.        Left eye: No discharge.     Extraocular Movements: Extraocular movements intact.     Conjunctiva/sclera: Conjunctivae  normal.  Cardiovascular:     Rate and Rhythm: Normal rate and regular rhythm.     Pulses: Normal pulses.     Heart sounds: Normal heart sounds. No murmur heard.    No friction rub. No gallop.  Pulmonary:     Effort: Pulmonary effort is normal. No respiratory distress.     Breath sounds: Normal breath sounds.  Abdominal:     General: Abdomen is flat.     Palpations: Abdomen is soft.     Tenderness: There is no abdominal tenderness. There is left CVA tenderness. There is no right CVA tenderness.  Skin:    General: Skin is warm and dry.  Neurological:     General: No focal deficit present.     Mental Status: She is alert. Mental status is at baseline.  Psychiatric:        Mood and Affect: Mood normal.     ED Results / Procedures / Treatments   Labs (all labs ordered are listed, but only abnormal results are displayed) Labs Reviewed  BASIC METABOLIC PANEL - Abnormal; Notable for the following components:      Result Value   CO2 21 (*)    Glucose, Bld 136 (*)    BUN 32 (*)    Creatinine, Ser 1.05 (*)    All other components within normal limits  URINALYSIS, ROUTINE W REFLEX MICROSCOPIC - Abnormal; Notable for the following components:   Specific Gravity, Urine 1.031 (*)    Glucose, UA >=500 (*)    All other components within normal limits  CBC WITH DIFFERENTIAL/PLATELET - Abnormal; Notable for the following components:   WBC 14.6 (*)    Neutro Abs 9.5 (*)    Monocytes Absolute 1.1 (*)    Abs Immature Granulocytes 0.08 (*)    All other components within normal limits  CBC WITH DIFFERENTIAL/PLATELET    EKG None  Radiology CT Renal Stone Study Result Date: 01/07/2024 CLINICAL DATA:  Abdominal pain, flank pain. Recently diagnosed with left stone. EXAM: CT ABDOMEN AND PELVIS WITHOUT CONTRAST TECHNIQUE: Multidetector CT imaging of the abdomen and pelvis was performed following the standard protocol without IV contrast. RADIATION DOSE REDUCTION: This exam was performed  according to the departmental dose-optimization program which includes automated exposure control, adjustment of the mA and/or kV according to patient size and/or use of iterative reconstruction technique. COMPARISON:  None Available. FINDINGS: Lower chest: Left basilar scarring.  No acute findings. Hepatobiliary: No focal liver abnormality is seen. Status post cholecystectomy. No biliary dilatation. Pancreas: No focal abnormality or ductal dilatation. Spleen: No focal abnormality.  Normal size. Adrenals/Urinary Tract: Adrenal glands normal. 6 x 3 mm distal left ureteral stone again noted, unchanged in position since prior study. No hydronephrosis. Urinary bladder unremarkable. Stomach/Bowel: Sigmoid diverticulosis. No active diverticulitis. Stomach and small bowel decompressed, unremarkable. Vascular/Lymphatic: Aortic atherosclerosis. No evidence  of aneurysm or adenopathy. Reproductive: Prior hysterectomy.  No adnexal masses. Other: No free fluid or free air. Musculoskeletal: No acute bony abnormality. IMPRESSION: Stable location of the 6 x 3 mm distal left ureteral stone. No hydronephrosis. Aortic atherosclerosis. Sigmoid diverticulosis. Electronically Signed   By: Charlett Nose M.D.   On: 01/07/2024 19:06    Procedures Procedures    Medications Ordered in ED Medications  morphine (PF) 2 MG/ML injection 2 mg (2 mg Intravenous Given 01/07/24 1831)  morphine (PF) 2 MG/ML injection 2 mg (2 mg Intravenous Given 01/07/24 1953)    ED Course/ Medical Decision Making/ A&P                                 Medical Decision Making  Patient is a 58 year old female presents the ED today complaining of left-sided flank pain with nephrolithiasis that has been present for 2 weeks.  Had seen [facility], Dr. Jennette Bill, and had been given Flomax for a 6.3 mm kidney stone and was told to come back in 2 weeks if stone had not passed as likely removal is needed.  Previous medical history of several kidney stones in  the past, noting this is her seventh stone.  Has had increased pain over the last 2 days specifically and has noticed a malodorous urine.  Pain uncontrolled at home rating an 8 out of 10 using Tylenol and Aleve without relief.  On physical exam, patient said to be afebrile, nontachycardic, no acute distress.  Noted to have left-sided CVA tenderness.  Exam is otherwise unremarkable.  Patient noted to have left-sided facial weakness from Bell's palsy.  Provided 2 mg morphine and on reevaluation patient is noted to have decreased pain from an 8 to a 6 out of 10.  Provided 2 more milligrams of morphine.  CBC was notable to have a leukocytosis of 14.6 as well as a BMP noting increased creatinine of 1.05 and BUN of 32.   Spoke with Dr. Gaylyn Lambert pace with urology who looked at the patient's chart and said that this patient is able to go home and call the office in the morning for stone removal.  That she should go n.p.o. after 12 AM tonight to prepare for stone removal.  Otherwise pain medication can be sent in and she can manage at home.  Patient vital signs been stable for the course of her time here.  Pain is under control at this time after reevaluation.  Will send home with pain medication and should return to ED precautions.  Have her follow-up with urology tomorrow.  Low concern for infected kidney stone or any other emergent pathology present this time.  I believe patient can be safely discharged at this time.   Differential diagnoses prior to evaluation: The emergent differential diagnosis includes, but is not limited to, nephrolithiasis, pyelonephritis, hydronephrosis, UTI, muscle strain, aortic aneurysm, PE. This is not an exhaustive differential.   Past Medical History / Co-morbidities / Social History: Henoch-Schnlein purpura, diabetes mellitus, Bell's palsy, HTN, gallbladder sludge.  Additional history: Chart reviewed. Pertinent results include: Patient seen on 3/1/125 where she was  noted to have a 6 x 3 mm left ureteral stone with a mild urinary tract infection.  Urology was spoken with and said that she could be sent home on Bactrim with Percocet and Zofran.  Lab Tests/Imaging studies: I personally interpreted labs/imaging and the pertinent results include:   CBC is noted  for a leukocytosis of 14.6 increased from previous 2 weeks ago at 9.6 BMP is notable for increased BUN of 32 as well as a increasing creatinine function of 1.5 Notably increased from 0.82 creatinine and BUN of 20 2 weeks ago.   CT renal did show stabilization of a 6 x 3 mm distal left ureteral stone with no hydronephrosis I agree with the radiologist interpretation.    Medications: I ordered medication including morphine.  I have reviewed the patients home medicines and have made adjustments as needed.   Disposition: After consideration of the diagnostic results and the patients response to treatment, I feel that the patient would benefit from discharge and treatment as above.   emergency department workup does not suggest an emergent condition requiring admission or immediate intervention beyond what has been performed at this time. The plan is: Pain management at home, follow-up with urology tomorrow, return for any new or worsening symptoms. The patient is safe for discharge and has been instructed to return immediately for worsening symptoms, change in symptoms or any other concerns.   Final Clinical Impression(s) / ED Diagnoses Final diagnoses:  Calculus of ureter    Rx / DC Orders ED Discharge Orders          Ordered    oxyCODONE-acetaminophen (PERCOCET/ROXICET) 5-325 MG tablet  Every 6 hours PRN        01/07/24 2012              Lavonia Drafts 01/07/24 2012    Gerhard Munch, MD 01/07/24 [phone]

## 2024-01-07 NOTE — ED Triage Notes (Signed)
 Pt states that two weeks ago she was diagnosed with a 6.3 mm kidney stone on the L side. Pt seen outpatient with [facility] and discussed giving two weeks of trying to pass it on her own, but unable to do so. Yesterday pt states that she began having increasing pain, uncontrolled by medication. Denies dysuria, no hematuria.

## 2024-01-08 ENCOUNTER — Other Ambulatory Visit: Payer: Self-pay

## 2024-01-08 ENCOUNTER — Encounter (HOSPITAL_COMMUNITY)
Admission: RE | Disposition: A | Discharge status: Home / Self Care | Payer: Self-pay | Source: Home / Self Care | Attending: Urology

## 2024-01-08 ENCOUNTER — Inpatient Hospital Stay (HOSPITAL_COMMUNITY)

## 2024-01-08 ENCOUNTER — Encounter (HOSPITAL_COMMUNITY): Payer: Self-pay | Admitting: Urology

## 2024-01-08 ENCOUNTER — Ambulatory Visit (HOSPITAL_COMMUNITY)
Admission: RE | Admit: 2024-01-08 | Discharge: 2024-01-08 | Disposition: A | Discharge status: Home / Self Care | Attending: Urology | Admitting: Urology

## 2024-01-08 ENCOUNTER — Other Ambulatory Visit: Payer: Self-pay | Admitting: Urology

## 2024-01-08 DIAGNOSIS — E1122 Type 2 diabetes mellitus with diabetic chronic kidney disease: Secondary | ICD-10-CM | POA: Insufficient documentation

## 2024-01-08 DIAGNOSIS — N189 Chronic kidney disease, unspecified: Secondary | ICD-10-CM | POA: Diagnosis not present

## 2024-01-08 DIAGNOSIS — N201 Calculus of ureter: Secondary | ICD-10-CM | POA: Insufficient documentation

## 2024-01-08 DIAGNOSIS — Z87440 Personal history of urinary (tract) infections: Secondary | ICD-10-CM | POA: Insufficient documentation

## 2024-01-08 DIAGNOSIS — G4730 Sleep apnea, unspecified: Secondary | ICD-10-CM | POA: Insufficient documentation

## 2024-01-08 DIAGNOSIS — Z87442 Personal history of urinary calculi: Secondary | ICD-10-CM | POA: Insufficient documentation

## 2024-01-08 DIAGNOSIS — E669 Obesity, unspecified: Secondary | ICD-10-CM | POA: Insufficient documentation

## 2024-01-08 DIAGNOSIS — Z79899 Other long term (current) drug therapy: Secondary | ICD-10-CM | POA: Diagnosis not present

## 2024-01-08 DIAGNOSIS — M1990 Unspecified osteoarthritis, unspecified site: Secondary | ICD-10-CM | POA: Diagnosis not present

## 2024-01-08 DIAGNOSIS — Z6834 Body mass index (BMI) 34.0-34.9, adult: Secondary | ICD-10-CM | POA: Diagnosis not present

## 2024-01-08 DIAGNOSIS — Z833 Family history of diabetes mellitus: Secondary | ICD-10-CM | POA: Insufficient documentation

## 2024-01-08 DIAGNOSIS — I129 Hypertensive chronic kidney disease with stage 1 through stage 4 chronic kidney disease, or unspecified chronic kidney disease: Secondary | ICD-10-CM | POA: Diagnosis not present

## 2024-01-08 LAB — GLUCOSE, CAPILLARY
Glucose-Capillary: 88 mg/dL (ref 70–99)
Glucose-Capillary: 102 mg/dL — ABNORMAL HIGH (ref 70–99)

## 2024-01-08 SURGERY — CYSTOSCOPY/URETEROSCOPY/HOLMIUM LASER/STENT PLACEMENT
Anesthesia: General | Laterality: Left

## 2024-01-08 MED ORDER — FENTANYL CITRATE (PF) 100 MCG/2ML IJ SOLN
INTRAMUSCULAR | Status: AC
  Filled 2024-01-08: qty 2

## 2024-01-08 MED ORDER — OXYCODONE HCL 5 MG/5ML PO SOLN: 5.0000 mg | Freq: Once | ORAL | Status: DC | PRN

## 2024-01-08 MED ORDER — INSULIN ASPART 100 UNIT/ML IJ SOLN: 0.0000 [IU] | INTRAMUSCULAR | Status: DC | PRN

## 2024-01-08 MED ORDER — SULFAMETHOXAZOLE-TRIMETHOPRIM 800-160 MG PO TABS: 1.0000 | ORAL_TABLET | Freq: Once | ORAL | 0 refills | Status: DC

## 2024-01-08 MED ORDER — FENTANYL CITRATE (PF) 100 MCG/2ML IJ SOLN
INTRAMUSCULAR | Status: DC | PRN
  Administered 2024-01-08 (×2): 25 ug via INTRAVENOUS
  Administered 2024-01-08: 50 ug via INTRAVENOUS
  Administered 2024-01-08 (×4): 25 ug via INTRAVENOUS

## 2024-01-08 MED ORDER — LIDOCAINE HCL (CARDIAC) PF 100 MG/5ML IV SOSY
PREFILLED_SYRINGE | INTRAVENOUS | Status: DC | PRN
  Administered 2024-01-08: 60 mg via INTRAVENOUS

## 2024-01-08 MED ORDER — MIDAZOLAM HCL 5 MG/5ML IJ SOLN
INTRAMUSCULAR | Status: DC | PRN
  Administered 2024-01-08 (×2): 1 mg via INTRAVENOUS

## 2024-01-08 MED ORDER — FENTANYL CITRATE PF 50 MCG/ML IJ SOSY: 25.0000 ug | PREFILLED_SYRINGE | INTRAMUSCULAR | Status: DC | PRN

## 2024-01-08 MED ORDER — ONDANSETRON HCL 4 MG/2ML IJ SOLN
INTRAMUSCULAR | Status: AC
  Filled 2024-01-08: qty 2

## 2024-01-08 MED ORDER — OXYCODONE HCL 5 MG PO TABS: 5.0000 mg | ORAL_TABLET | Freq: Once | ORAL | Status: DC | PRN

## 2024-01-08 MED ORDER — TAMSULOSIN HCL 0.4 MG PO CAPS
0.4000 mg | ORAL_CAPSULE | Freq: Every day | ORAL | 0 refills | Status: AC
Start: 2024-01-08 — End: 2024-01-22

## 2024-01-08 MED ORDER — GENTAMICIN SULFATE 40 MG/ML IJ SOLN
240.0000 mg | INTRAVENOUS | Status: AC
  Administered 2024-01-08: 240 mg via INTRAVENOUS
  Filled 2024-01-08: qty 6

## 2024-01-08 MED ORDER — PROPOFOL 10 MG/ML IV BOLUS
INTRAVENOUS | Status: DC | PRN
  Administered 2024-01-08: 200 mg via INTRAVENOUS

## 2024-01-08 MED ORDER — CIPROFLOXACIN IN D5W 400 MG/200ML IV SOLN
400.0000 mg | INTRAVENOUS | Status: DC
Start: 2024-01-08 — End: 2024-01-08

## 2024-01-08 MED ORDER — PHENAZOPYRIDINE HCL 200 MG PO TABS: 200.0000 mg | ORAL_TABLET | Freq: Three times a day (TID) | ORAL | 0 refills | Status: DC | PRN

## 2024-01-08 MED ORDER — IOHEXOL 300 MG/ML  SOLN
INTRAMUSCULAR | Status: DC | PRN
  Administered 2024-01-08: 10 mL

## 2024-01-08 MED ORDER — PROPOFOL 10 MG/ML IV BOLUS
INTRAVENOUS | Status: AC
  Filled 2024-01-08: qty 20

## 2024-01-08 MED ORDER — ONDANSETRON HCL 4 MG/2ML IJ SOLN
INTRAMUSCULAR | Status: DC | PRN
  Administered 2024-01-08 (×2): 4 mg via INTRAVENOUS

## 2024-01-08 MED ORDER — STERILE WATER FOR IRRIGATION IR SOLN
Status: DC | PRN
  Administered 2024-01-08: 3000 mL

## 2024-01-08 MED ORDER — MIDAZOLAM HCL 2 MG/2ML IJ SOLN
INTRAMUSCULAR | Status: AC
  Filled 2024-01-08: qty 2

## 2024-01-08 MED ORDER — LIDOCAINE HCL (PF) 2 % IJ SOLN
INTRAMUSCULAR | Status: AC
  Filled 2024-01-08: qty 5

## 2024-01-08 MED ORDER — ROCURONIUM BROMIDE 10 MG/ML (PF) SYRINGE
PREFILLED_SYRINGE | INTRAVENOUS | Status: AC
  Filled 2024-01-08: qty 10

## 2024-01-08 MED ORDER — AMISULPRIDE (ANTIEMETIC) 5 MG/2ML IV SOLN: 10.0000 mg | Freq: Once | INTRAVENOUS | Status: DC | PRN

## 2024-01-08 MED ORDER — DEXAMETHASONE SODIUM PHOSPHATE 10 MG/ML IJ SOLN
INTRAMUSCULAR | Status: AC
  Filled 2024-01-08: qty 1

## 2024-01-08 MED ORDER — SODIUM CHLORIDE 0.9 % IV SOLN: 12.5000 mg | INTRAVENOUS | Status: DC | PRN

## 2024-01-08 MED ORDER — METHOCARBAMOL 750 MG PO TABS: 750.0000 mg | ORAL_TABLET | Freq: Four times a day (QID) | ORAL | 0 refills | Status: AC | PRN

## 2024-01-08 MED ORDER — CHLORHEXIDINE GLUCONATE 0.12 % MT SOLN
15.0000 mL | Freq: Once | OROMUCOSAL | Status: AC
  Administered 2024-01-08: 15 mL via OROMUCOSAL
  Filled 2024-01-08: qty 15

## 2024-01-08 MED ORDER — HYOSCYAMINE SULFATE 0.125 MG PO TBDP: 0.1250 mg | ORAL_TABLET | ORAL | 0 refills | Status: DC | PRN

## 2024-01-08 MED ORDER — LACTATED RINGERS IV SOLN: INTRAVENOUS | Status: DC

## 2024-01-08 SURGICAL SUPPLY — 20 items
BAG URO CATCHER STRL LF (MISCELLANEOUS) ×1 IMPLANT
BASKET ZERO TIP NITINOL 2.4FR (BASKET) IMPLANT
BULB IRRIG PATHFIND (MISCELLANEOUS) IMPLANT
CATH URETL OPEN 5X70 (CATHETERS) ×1 IMPLANT
CLOTH BEACON ORANGE TIMEOUT ST (SAFETY) ×1 IMPLANT
EXTRACTOR STONE 1.7FRX115CM (UROLOGICAL SUPPLIES) IMPLANT
FIBER LASER MOSES 200 DFL (Laser) IMPLANT
FIBER LASER MOSES 365 DFL (Laser) IMPLANT
GLOVE SURG LX STRL 8.0 MICRO (GLOVE) ×1 IMPLANT
GOWN STRL REUS W/ TWL XL LVL3 (GOWN DISPOSABLE) ×1 IMPLANT
GUIDEWIRE ANG ZIPWIRE 038X150 (WIRE) IMPLANT
GUIDEWIRE STR DUAL SENSOR (WIRE) ×1 IMPLANT
KIT TURNOVER KIT A (KITS) IMPLANT
MANIFOLD NEPTUNE II (INSTRUMENTS) ×1 IMPLANT
PACK CYSTO (CUSTOM PROCEDURE TRAY) ×1 IMPLANT
SHEATH NAVIGATOR HD 12/14X28 (SHEATH) IMPLANT
SHEATH NAVIGATOR HD 12/14X36 (SHEATH) IMPLANT
STENT URET 6FRX24 CONTOUR (STENTS) IMPLANT
TUBING CONNECTING 10 (TUBING) ×1 IMPLANT
TUBING UROLOGY SET (TUBING) ×1 IMPLANT

## 2024-01-08 NOTE — Anesthesia Preprocedure Evaluation (Signed)
 Anesthesia Evaluation  Patient identified by MRN, date of birth, ID band Patient awake    Reviewed: Allergy & Precautions, H&P , NPO status , Patient's Chart, lab work & pertinent test results, reviewed documented beta blocker date and time   Airway Mallampati: III  TM Distance: >3 FB Neck ROM: Full    Dental no notable dental hx. (+) Teeth Intact   Pulmonary sleep apnea    Pulmonary exam normal breath sounds clear to auscultation       Cardiovascular hypertension, Pt. on medications Normal cardiovascular exam Rhythm:Regular Rate:Normal     Neuro/Psych  Neuromuscular disease    GI/Hepatic negative GI ROS, Neg liver ROS,,,  Endo/Other  diabetes, Type 2    Renal/GU CRFRenal disease     Musculoskeletal  (+) Arthritis , Osteoarthritis,    Abdominal  (+) + obese  Peds  Hematology negative hematology ROS (+)   Anesthesia Other Findings   Reproductive/Obstetrics                             Anesthesia Physical Anesthesia Plan  ASA: III  Anesthesia Plan: General   Post-op Pain Management:    Induction: Intravenous  PONV Risk Score and Plan: 3 and Ondansetron, Dexamethasone, Midazolam and Treatment may vary due to age or medical condition  Airway Management Planned: LMA  Additional Equipment:   Intra-op Plan:   Post-operative Plan: Extubation in OR  Informed Consent: I have reviewed the patients History and Physical, chart, labs and discussed the procedure including the risks, benefits and alternatives for the proposed anesthesia with the patient or authorized representative who has indicated his/her understanding and acceptance.       Plan Discussed with: CRNA  Anesthesia Plan Comments:         Anesthesia Quick Evaluation

## 2024-01-08 NOTE — H&P (Signed)
 H&P   Chief Complaint: Left ureteral stone  History of Present Illness: 58 year old female with a distal left ureteral stone she was seen in clinic several weeks ago give the offer of treatment versus trial of passage.  Since patient was asymptomatic symptomatic patient opted for trial of passage.  Patient is a 6 mm stone which is cylindrical in shape oriented lengthwise down the ureter.  She is been on tamsulosin but started to have pain over the weekend represented on Sunday she came in the clinic today for discussion of management of the stone.  She feels like she cannot last on the week and would like to get the stone treated today.  She denies any nausea vomiting fevers or chills.  Past Medical History:  Diagnosis Date   Arthritis    Bell's palsy    approx 2015   Chronic kidney disease    Diabetes mellitus    only when on steriods   Esophagitis 2012   EGD   Family history of adverse reaction to anesthesia    PONV and hallucinations-MOM   Henoch-Schonlein purpura (HCC)    History of kidney stones    History of UTI    Hypertension    IBS (irritable bowel syndrome)    Obesity    Sleep apnea    Past Surgical History:  Procedure Laterality Date   ABDOMINAL HYSTERECTOMY  2003   ACHILLES TENDON SURGERY Left    CESAREAN SECTION     CHOLECYSTECTOMY N/A 01/03/2020   Procedure: LAPAROSCOPIC CHOLECYSTECTOMY;  Surgeon: Lucretia Roers, MD;  Location: [facility];  Service: General;  Laterality: N/A;   KNEE SURGERY Left    arthroscopic   NECK SURGERY  2000   cervical fusion 6/7   REPLACEMENT TOTAL KNEE Left 2019   [facility]   TONSILECTOMY, ADENOIDECTOMY, BILATERAL MYRINGOTOMY AND TUBES     TONSILLECTOMY     TUBAL LIGATION  1996    Home Medications:  No medications prior to admission.   Allergies:  Allergies  Allergen Reactions   Hydromorphone Hcl Nausea And Vomiting   Sulfa Antibiotics Nausea And Vomiting   Cephalexin Hives   Ciprocin-Fluocin-Procin [Fluocinolone Acetonide]  Hives   Bee Venom Swelling    REACTION: Severe swelling   Levofloxacin Hives   Celebrex [Celecoxib] Rash    itching   Penicillins Rash    Did it involve swelling of the face/tongue/throat, SOB, or low BP? No Did it involve sudden or severe rash/hives, skin peeling, or any reaction on the inside of your mouth or nose? Yes Did you need to seek medical attention at a hospital or doctor's office? Unknown When did it last happen? childhood       If all above answers are "NO", may proceed with cephalosporin use.     Family History  Problem Relation Age of Onset   Leukemia Mother    Diabetes Mother    Breast cancer Mother    Hypertension Mother    Stroke Mother    Esophageal cancer Brother    Diabetes Maternal Aunt    Diabetes Maternal Uncle    Diabetes Maternal Grandfather    Heart attack Father    Heart failure Father    Colon cancer Neg Hx    Social History:  reports that she has never smoked. She has never used smokeless tobacco. She reports current alcohol use of about 1.0 standard drink of alcohol per week. She reports that she does not use drugs.  ROS: A complete review of systems  was performed.  All systems are negative except for pertinent findings as noted. ROS   Physical Exam:  Vital signs in last 24 hours: Temp:  [98.4 F (36.9 C)] 98.4 F (36.9 C) (03/16 1722) Pulse Rate:  [71-99] 76 (03/16 2030) Resp:  [17] 17 (03/16 2030) BP: (116-145)/(67-85) 131/67 (03/16 2030) SpO2:  [92 %-100 %] 92 % (03/16 2030) General:  Alert and oriented, No acute distress Respiratory: Normal work of breathing on room air Cardiovascular: Regular rate per monitor GU: No catheter  Laboratory Data:  Results for orders placed or performed during the hospital encounter of 01/07/24 (from the past 24 hours)  Basic metabolic panel     Status: Abnormal   Collection Time: 01/07/24  6:34 PM  Result Value Ref Range   Sodium 136 135 - 145 mmol/L   Potassium 4.1 3.5 - 5.1 mmol/L   Chloride  106 98 - 111 mmol/L   CO2 21 (L) 22 - 32 mmol/L   Glucose, Bld 136 (H) 70 - 99 mg/dL   BUN 32 (H) 6 - 20 mg/dL   Creatinine, Ser 2.95 (H) 0.44 - 1.00 mg/dL   Calcium 9.0 8.9 - 28.4 mg/dL   GFR, Estimated >13 >24 mL/min   Anion gap 9 5 - 15  CBC with Differential/Platelet     Status: Abnormal   Collection Time: 01/07/24  7:12 PM  Result Value Ref Range   WBC 14.6 (H) 4.0 - 10.5 K/uL   RBC 4.73 3.87 - 5.11 MIL/uL   Hemoglobin 13.9 12.0 - 15.0 g/dL   HCT 40.1 02.7 - 25.3 %   MCV 91.8 80.0 - 100.0 fL   MCH 29.4 26.0 - 34.0 pg   MCHC 32.0 30.0 - 36.0 g/dL   RDW 66.4 40.3 - 47.4 %   Platelets 381 150 - 400 K/uL   nRBC 0.0 0.0 - 0.2 %   Neutrophils Relative % 65 %   Neutro Abs 9.5 (H) 1.7 - 7.7 K/uL   Lymphocytes Relative 26 %   Lymphs Abs 3.8 0.7 - 4.0 K/uL   Monocytes Relative 8 %   Monocytes Absolute 1.1 (H) 0.1 - 1.0 K/uL   Eosinophils Relative 0 %   Eosinophils Absolute 0.1 0.0 - 0.5 K/uL   Basophils Relative 0 %   Basophils Absolute 0.1 0.0 - 0.1 K/uL   Immature Granulocytes 1 %   Abs Immature Granulocytes 0.08 (H) 0.00 - 0.07 K/uL  Urinalysis, Routine w reflex microscopic -Urine, Clean Catch     Status: Abnormal   Collection Time: 01/07/24  7:21 PM  Result Value Ref Range   Color, Urine YELLOW YELLOW   APPearance CLEAR CLEAR   Specific Gravity, Urine 1.031 (H) 1.005 - 1.030   pH 5.0 5.0 - 8.0   Glucose, UA >=500 (A) NEGATIVE mg/dL   Hgb urine dipstick NEGATIVE NEGATIVE   Bilirubin Urine NEGATIVE NEGATIVE   Ketones, ur NEGATIVE NEGATIVE mg/dL   Protein, ur NEGATIVE NEGATIVE mg/dL   Nitrite NEGATIVE NEGATIVE   Leukocytes,Ua NEGATIVE NEGATIVE   RBC / HPF 0-5 0 - 5 RBC/hpf   WBC, UA 0-5 0 - 5 WBC/hpf   Bacteria, UA NONE SEEN NONE SEEN   Squamous Epithelial / HPF 0-5 0 - 5 /HPF   No results found for this or any previous visit (from the past 240 hours). Creatinine: Recent Labs    01/07/24 1834  CREATININE 1.05*    Impression/Assessment:  58 year old female with  a left sided distal ureteral stone.  She  is currently having pain would like to have this managed.  Will plan for left uteroscopy with laser lithotripsy.  We discussed risk benefits alternatives to ureteroscopy.  This included bleeding infection and damage to surrounding structures surrounding structures including ureter as well as urethra.  We discussed the need for stent postoperatively as well as the potential symptoms of stent placement.  We discussed possible inability to complete procedure due to caliber of your ureter or inability to pass stone possibly requiring long-term stent versus nephrostomy tube.  We discussed need for possible second surgery.  Patient voiced their understanding and consent was obtained.   Plan:  Keep n.p.o. Plan for left ureteroscopy with laser lithotripsy Patient will be discharged post procedure  Adonis Brook 01/08/2024, 12:37 PM

## 2024-01-08 NOTE — Op Note (Signed)
 Preoperative diagnosis: left ureteral calculus  Postoperative diagnosis: left ureteral calculus  Procedure:  Cystoscopy left ureteroscopy and stone removal Ureteroscopic laser lithotripsy left 42F x 24 ureteral stent placement w/ strings  left retrograde pyelography with interpretation  Surgeon: Dr. Vilma Prader  Anesthesia: General  Complications: None  Intraoperative findings:  Left distal ureteral stone.   All stone removed. 6 x 24 double-J stent with strings placed in the left ureter  Left retrograde pyelogram interpretation: Slight distal ureter narrowing with a stone was previously located no evidence of ureteral perforation no filling defects within the ureter.  EBL: Minimal  Specimens: left ureteral calculus  Disposition of specimens: [facility] for stone analysis  Indication: Victoria Williams is a 58 y.o.   patient with a  left ureteral stone and associated left symptoms. After reviewing the management options for treatment, the patient elected to proceed with the above surgical procedure(s). We have discussed the potential benefits and risks of the procedure, side effects of the proposed treatment, the likelihood of the patient achieving the goals of the procedure, and any potential problems that might occur during the procedure or recuperation. Informed consent has been obtained.   Description of procedure:  The patient was taken to the operating room and general anesthesia was induced.  The patient was placed in the dorsal lithotomy position, prepped and draped in the usual sterile fashion, and preoperative antibiotics were administered. A preoperative time-out was performed.   Cystourethroscopy was performed.  The patient's urethra was examined and was normal. Pan cystoscopy was performed and the bladder systematically examined in its entirety. There was no evidence for any bladder tumors, stones, or other mucosal pathology.    Attention then  turned to the left ureteral orifice.  A 0.38 sensor guidewire was then advanced up the left ureter into the renal pelvis under fluoroscopic guidance. The 6 Fr semirigid ureteroscope was then advanced into the ureter next to the guidewire and the calculus was identified.   The stone was then fragmented with the 365 micron holmium laser fiber on a setting of 0.5 and frequency of 5 Hz.   All stones were then removed from the ureter with an N-gage nitinol basket.  Reinspection of the ureter revealed no remaining visible stones or fragments.   Evaluation the ureter demonstrated no residual stone.  The semirigid ureteroscope was pulled back distal to the area where the stone had been found and a retrograde pyelogram was performed.  Initiated slight narrowing with the stone and been no perforations no extravasation no filling defects within the ureter.  The stent was then loaded onto the wire and the stent was placed in the kidney and bladder under fluoroscopic guidance with the strings left on.  The bladder was then emptied and the procedure ended.  The patient appeared to tolerate the procedure well and without complications.  The patient was able to be awakened and transferred to the recovery unit in satisfactory condition.   Disposition: The tether of the stent was left on tape to the mons pubis..  Instructions for removing the stent have been provided to the patient.  The patient will remove the stent on Friday this week and will follow-up in 8 to 10 weeks with renal ultrasound.

## 2024-01-08 NOTE — Anesthesia Procedure Notes (Signed)
 Procedure Name: LMA Insertion Date/Time: 01/08/2024 4:36 PM  Performed by: Maurene Capes, CRNAPre-anesthesia Checklist: Patient identified, Emergency Drugs available, Suction available and Patient being monitored Patient Re-evaluated:Patient Re-evaluated prior to induction Oxygen Delivery Method: Circle System Utilized Preoxygenation: Pre-oxygenation with 100% oxygen Induction Type: IV induction Ventilation: Mask ventilation without difficulty LMA: LMA inserted LMA Size: 4.0 Number of attempts: 1 Placement Confirmation: positive ETCO2 Tube secured with: Tape Dental Injury: Teeth and Oropharynx as per pre-operative assessment

## 2024-01-08 NOTE — Transfer of Care (Signed)
 Immediate Anesthesia Transfer of Care Note  Patient: Victoria Williams  Procedure(s) Performed: CYSTOSCOPY/URETEROSCOPY/HOLMIUM LASER/STENT PLACEMENT (Left)  Patient Location: PACU  Anesthesia Type:General  Level of Consciousness: drowsy  Airway & Oxygen Therapy: Patient Spontanous Breathing and Patient connected to face mask oxygen  Post-op Assessment: Report given to RN and Post -op Vital signs reviewed and stable  Post vital signs: Reviewed and stable  Last Vitals:  Vitals Value Taken Time  BP 137/80 01/08/24 1727  Temp 36.6 C 01/08/24 1727  Pulse 86 01/08/24 1728  Resp 12 01/08/24 1728  SpO2 95 % 01/08/24 1728  Vitals shown include unfiled device data.  Last Pain:  Vitals:   01/08/24 1511  TempSrc: Oral  PainSc: 6       Patients Stated Pain Goal: 4 (01/08/24 1511)  Complications: No notable events documented.

## 2024-01-08 NOTE — Anesthesia Postprocedure Evaluation (Signed)
 Anesthesia Post Note  Patient: Remona M Mccalla  Procedure(s) Performed: CYSTOSCOPY/URETEROSCOPY/HOLMIUM LASER/STENT PLACEMENT (Left)     Patient location during evaluation: PACU Anesthesia Type: General Level of consciousness: awake and alert Pain management: pain level controlled Vital Signs Assessment: post-procedure vital signs reviewed and stable Respiratory status: spontaneous breathing, nonlabored ventilation and respiratory function stable Cardiovascular status: blood pressure returned to baseline and stable Postop Assessment: no apparent nausea or vomiting Anesthetic complications: no   No notable events documented.  Last Vitals:  Vitals:   01/08/24 1745 01/08/24 1756  BP: 135/79   Pulse: 79 82  Resp: 17 13  Temp:  36.7 C  SpO2: 93% 94%    Last Pain:  Vitals:   01/08/24 1745  TempSrc:   PainSc: 2                  Lowella Curb

## 2024-01-08 NOTE — Discharge Instructions (Addendum)
 DISCHARGE INSTRUCTIONS FOR KIDNEY STONE/URETERAL STENT   MEDICATIONS:  1.  Resume all your other meds from home - except do not take any extra narcotic pain meds that you may have at home.  2. Pyridium is to help with the burning/stinging when you urinate. Flomax can help with ureteral stent discomfort 3. Robaxin is a muscle relaxer, you can take this up to 4 times daily for pain  4. Hycosamine can be taken for any bladder spasms (feeling like you have the urge to pee that is sudden in onset and particularly strong) 4. You do NOT need to take an antibiotic before your remove your stent. You do not need to pick this up at the pharmacy if they have this prescription already called in.   ACTIVITY:  1. No strenuous activity x 1week  2. No driving while on narcotic pain medications  3. Drink plenty of water  4. Continue to walk at home - you can still get blood clots when you are at home, so keep active, but don't over do it.  5. May return to work/school tomorrow or when you feel ready   BATHING:  1. You can shower and we recommend daily showers  2. You have a string coming from your urethra: The stent string is attached to your ureteral stent. Do not pull on this.   SIGNS/SYMPTOMS TO CALL:  Please call us if you have a fever greater than 101.5, uncontrolled nausea/vomiting, uncontrolled pain, dizziness, unable to urinate, bloody urine, chest pain, shortness of breath, leg swelling, leg pain, redness around wound, drainage from wound, or any other concerns or questions.   You can reach [place] at [phone].   FOLLOW-UP:  1. You have an appointment in 6 weeks with a ultrasound of your kidneys prior.   2. You have a string attached to your stent, you may remove it on Friday. To do this, pull the strings until the stents are completely removed. You may feel an odd sensation in your back.

## 2024-01-09 ENCOUNTER — Encounter (HOSPITAL_COMMUNITY): Payer: Self-pay | Admitting: Urology

## 2024-01-16 LAB — CALCULI, WITH PHOTOGRAPH (CLINICAL LAB)
Calcium Oxalate Monohydrate: 100 %
Weight Calculi: 15 mg

## 2024-01-24 ENCOUNTER — Ambulatory Visit: Payer: BC Managed Care – PPO | Admitting: "Endocrinology

## 2024-01-24 ENCOUNTER — Encounter: Payer: Self-pay | Admitting: "Endocrinology

## 2024-01-24 VITALS — BP 100/68 | HR 84 | Ht 64.0 in | Wt 206.2 lb

## 2024-01-24 DIAGNOSIS — I10 Essential (primary) hypertension: Secondary | ICD-10-CM

## 2024-01-24 DIAGNOSIS — E782 Mixed hyperlipidemia: Secondary | ICD-10-CM | POA: Diagnosis not present

## 2024-01-24 DIAGNOSIS — E559 Vitamin D deficiency, unspecified: Secondary | ICD-10-CM

## 2024-01-24 DIAGNOSIS — E1165 Type 2 diabetes mellitus with hyperglycemia: Secondary | ICD-10-CM

## 2024-01-24 DIAGNOSIS — E66812 Obesity, class 2: Secondary | ICD-10-CM

## 2024-01-24 DIAGNOSIS — Z6837 Body mass index (BMI) 37.0-37.9, adult: Secondary | ICD-10-CM

## 2024-01-24 DIAGNOSIS — Z7984 Long term (current) use of oral hypoglycemic drugs: Secondary | ICD-10-CM

## 2024-01-24 DIAGNOSIS — Z794 Long term (current) use of insulin: Secondary | ICD-10-CM

## 2024-01-24 MED ORDER — EMPAGLIFLOZIN 25 MG PO TABS: 25.0000 mg | ORAL_TABLET | Freq: Every day | ORAL | 1 refills | Status: DC

## 2024-01-24 MED ORDER — TRESIBA FLEXTOUCH 100 UNIT/ML ~~LOC~~ SOPN: PEN_INJECTOR | SUBCUTANEOUS | 2 refills | Status: DC

## 2024-01-24 NOTE — Patient Instructions (Signed)

## 2024-01-24 NOTE — Progress Notes (Signed)
 01/24/2024, 7:02 PM   Endocrinology follow-up note  Subjective:    Patient ID: Victoria Williams, female    DOB: 11/21/1956.  Victoria Williams is being seen in follow-up after she was seen in consultation for management of currently controlled asymptomatic diabetes requested by  Assunta Found, MD.   Past Medical History:  Diagnosis Date   Arthritis    Bell's palsy    approx 2015   Chronic kidney disease    Diabetes mellitus    only when on steriods   Esophagitis 2012   EGD   Family history of adverse reaction to anesthesia    PONV and hallucinations-MOM   Henoch-Schonlein purpura (HCC)    History of kidney stones    History of UTI    Hypertension    IBS (irritable bowel syndrome)    Obesity    Sleep apnea     Past Surgical History:  Procedure Laterality Date   ABDOMINAL HYSTERECTOMY  2003   ACHILLES TENDON SURGERY Left    CESAREAN SECTION     CHOLECYSTECTOMY N/A 01/03/2020   Procedure: LAPAROSCOPIC CHOLECYSTECTOMY;  Surgeon: Lucretia Roers, MD;  Location: [facility];  Service: General;  Laterality: N/A;   CYSTOSCOPY/URETEROSCOPY/HOLMIUM LASER/STENT PLACEMENT Left 01/08/2024   Procedure: CYSTOSCOPY/URETEROSCOPY/HOLMIUM LASER/STENT PLACEMENT;  Surgeon: Adonis Brook, MD;  Location: [facility];  Service: Urology;  Laterality: Left;   KNEE SURGERY Left    arthroscopic   NECK SURGERY  2000   cervical fusion 6/7   REPLACEMENT TOTAL KNEE Left 2019   [facility]   TONSILECTOMY, ADENOIDECTOMY, BILATERAL MYRINGOTOMY AND TUBES     TONSILLECTOMY     TUBAL LIGATION  1996    Social History   Socioeconomic History   Marital status: Married    Spouse name: Not on file   Number of children: 3   Years of education: some college   Highest education level: Not on file  Occupational History    Comment: [employer], loan processor  Tobacco Use   Smoking status: Never    Passive exposure: Never    Smokeless tobacco: Never  Vaping Use   Vaping status: Never Used  Substance and Sexual Activity   Alcohol use: Yes    Alcohol/week: 1.0 standard drink of alcohol    Types: 1 Glasses of wine per week    Comment: 2 a month    Drug use: No   Sexual activity: Yes    Birth control/protection: Surgical  Other Topics Concern   Not on file  Social History Narrative   Lives with spouse   2 caffeine drinks daily    Social Drivers of Health   Financial Resource Strain: Low Risk  (06/01/2023)   Received from [facility]   Overall Financial Resource Strain (CARDIA)    Difficulty of Paying Living Expenses: Not hard at all  Food Insecurity: No Food Insecurity (06/01/2023)   Received from [facility]   Hunger Vital Sign    Worried About Running Out of Food in the Last Year: Never true    Ran Out of Food in the Last Year: Never true  Transportation Needs: No Transportation Needs (06/01/2023)   Received from [facility] - Transportation    Lack of Transportation (Medical): No    Lack of Transportation (Non-Medical): No  Physical Activity: Insufficiently Active (06/01/2023)   Received from [facility]   Exercise Vital Sign    Days of Exercise per Week: 1 day    Minutes of Exercise per Session: 20 min  Stress: No Stress Concern Present (06/01/2023)   Received from [facility] - Occupational Stress Questionnaire    Feeling of Stress : Not at all  Social Connections: Socially Integrated (06/01/2023)   Received from [facility]   Social Network    How would you rate your social network (family, work, friends)?: Good participation with social networks    Family History  Problem Relation Age of Onset   Leukemia Mother    Diabetes Mother    Breast cancer Mother    Hypertension Mother    Stroke Mother    Esophageal cancer Brother    Diabetes Maternal Aunt    Diabetes Maternal Uncle    Diabetes Maternal Grandfather    Heart attack  Father    Heart failure Father    Colon cancer Neg Hx     Outpatient Encounter Medications as of 01/24/2024  Medication Sig   empagliflozin (JARDIANCE) 25 MG TABS tablet Take 1 tablet (25 mg total) by mouth daily with breakfast.   ACCU-CHEK GUIDE TEST test strip USE TO TEST BLOOD SUGAR FOUR TIMES DAILY AS DIRECTED.   Accu-Chek Softclix Lancets lancets Use as instructed   acetaminophen (TYLENOL) 500 MG tablet Take 1,000 mg by mouth 2 (two) times daily as needed for headache.   B-D ULTRAFINE III SHORT PEN 31G X 8 MM MISC USE ONCE DAILY   Blood Glucose Monitoring Suppl (ACCU-CHEK GUIDE ME) w/Device KIT 1 Piece by Does not apply route as directed.   calcium carbonate (TUMS - DOSED IN MG ELEMENTAL CALCIUM) 500 MG chewable tablet Chew 1 tablet by mouth daily as needed for indigestion or heartburn.   cholecalciferol (VITAMIN D3) 25 MCG (1000 UNIT) tablet Take 2,000 Units by mouth daily.   diclofenac Sodium (VOLTAREN ARTHRITIS PAIN) 1 % GEL Apply 4 g topically 4 (four) times daily.   dicyclomine (BENTYL) 10 MG capsule Take 10 mg by mouth 2 (two) times daily.    hyoscyamine (ANASPAZ) 0.125 MG TBDP disintergrating tablet Place 1 tablet (0.125 mg total) under the tongue every 4 (four) hours as needed for up to 7 days. For bladder spasms   ibuprofen (ADVIL,MOTRIN) 200 MG tablet Take 600 mg by mouth 2 (two) times daily as needed for moderate pain.   insulin degludec (TRESIBA FLEXTOUCH) 100 UNIT/ML FlexTouch Pen ADMINISTER 50 UNITS UNDER THE SKIN AT BEDTIME   losartan-hydrochlorothiazide (HYZAAR) 50-12.5 MG tablet Take 1 tablet by mouth daily.   ondansetron (ZOFRAN-ODT) 4 MG disintegrating tablet 4mg  ODT q4 hours prn nausea/vomit   oxyCODONE-acetaminophen (PERCOCET/ROXICET) 5-325 MG tablet Take 1 tablet by mouth every 6 (six) hours as needed for severe pain (pain score 7-10).   phenazopyridine (PYRIDIUM) 200 MG tablet Take 1 tablet (200 mg total) by mouth 3 (three) times daily as needed for pain.    rosuvastatin (CRESTOR) 10 MG tablet TAKE 1 TABLET(10 MG) BY MOUTH DAILY   tolterodine (DETROL) 2 MG tablet Take 2 mg by mouth 2 (two) times daily.   [DISCONTINUED] insulin degludec (TRESIBA FLEXTOUCH) 100 UNIT/ML FlexTouch Pen ADMINISTER 60 UNITS UNDER  THE SKIN AT BEDTIME   [DISCONTINUED] JARDIANCE 10 MG TABS tablet Take 1 tablet (10 mg total) by mouth daily.   No facility-administered encounter medications on file as of 01/24/2024.    ALLERGIES: Allergies  Allergen Reactions   Hydromorphone Hcl Nausea And Vomiting   Sulfa Antibiotics Nausea And Vomiting   Cephalexin Hives   Ciprocin-Fluocin-Procin [Fluocinolone Acetonide] Hives   Bee Venom Swelling    REACTION: Severe swelling   Levofloxacin Hives   Celebrex [Celecoxib] Rash    itching   Penicillins Rash    Did it involve swelling of the face/tongue/throat, SOB, or low BP? No Did it involve sudden or severe rash/hives, skin peeling, or any reaction on the inside of your mouth or nose? Yes Did you need to seek medical attention at a hospital or doctor's office? Unknown When did it last happen? childhood       If all above answers are "NO", may proceed with cephalosporin use.     VACCINATION STATUS:  There is no immunization history on file for this patient.  Diabetes She presents for her follow-up diabetic visit. She has type 2 diabetes mellitus. Her disease course has been improving. There are no hypoglycemic associated symptoms. Pertinent negatives for hypoglycemia include no confusion, headaches, pallor or seizures. Pertinent negatives for diabetes include no chest pain, no fatigue, no polydipsia, no polyphagia and no polyuria. There are no hypoglycemic complications. Symptoms are improving. There are no diabetic complications. Risk factors for coronary artery disease include dyslipidemia, diabetes mellitus, family history, hypertension, obesity, post-menopausal and sedentary lifestyle. Current diabetic treatment includes insulin  injections and oral agent (monotherapy). Her weight is decreasing steadily. She is following a generally unhealthy diet. When asked about meal planning, she reported none. She has not had a previous visit with a dietitian. She participates in exercise intermittently. Her home blood glucose trend is decreasing steadily. Her breakfast blood glucose range is generally 130-140 mg/dl. Her bedtime blood glucose range is generally 140-180 mg/dl. Her overall blood glucose range is 140-180 mg/dl. (She presents with improved and controlled glycemic profile averaging 144 for the last 30 days.  Her point-of-care A1c 6.6% improved from 7.5%.    She did not document hypoglycemia.    ) An ACE inhibitor/angiotensin II receptor blocker is being taken. Eye exam is current.  Hypertension This is a chronic problem. The current episode started more than 1 year ago. The problem is controlled. Pertinent negatives include no chest pain, headaches, palpitations or shortness of breath. Risk factors for coronary artery disease include diabetes mellitus, dyslipidemia, family history, obesity, post-menopausal state and sedentary lifestyle. Past treatments include angiotensin blockers.  Hyperlipidemia This is a chronic problem. The current episode started more than 1 year ago. The problem is uncontrolled. Pertinent negatives include no chest pain, myalgias or shortness of breath. She is currently on no antihyperlipidemic treatment. Risk factors for coronary artery disease include diabetes mellitus, dyslipidemia, family history, hypertension, obesity, a sedentary lifestyle and post-menopausal.     Review of Systems  Constitutional:  Negative for chills, fatigue, fever and unexpected weight change.  HENT:  Negative for trouble swallowing and voice change.   Eyes:  Negative for visual disturbance.  Respiratory:  Negative for cough, shortness of breath and wheezing.   Cardiovascular:  Negative for chest pain, palpitations and leg  swelling.  Gastrointestinal:  Negative for diarrhea, nausea and vomiting.  Endocrine: Negative for cold intolerance, heat intolerance, polydipsia, polyphagia and polyuria.  Musculoskeletal:  Negative for arthralgias and myalgias.  Skin:  Negative for color change, pallor, rash and wound.  Neurological:  Negative for seizures and headaches.  Psychiatric/Behavioral:  Negative for confusion and suicidal ideas.     Objective:       01/24/2024    3:20 PM 01/08/2024    6:05 PM 01/08/2024    5:56 PM  Vitals with BMI  Height 5\' 4"     Weight 206 lbs 3 oz    BMI 35.38    Systolic 100 129   Diastolic 68 71   Pulse 84 78 82    BP 100/68   Pulse 84   Ht 5\' 4"  (1.626 m)   Wt 206 lb 3.2 oz (93.5 kg)   BMI 35.39 kg/m   Wt Readings from Last 3 Encounters:  01/24/24 206 lb 3.2 oz (93.5 kg)  01/08/24 203 lb 6.4 oz (92.3 kg)  12/23/23 210 lb (95.3 kg)       CMP ( most recent) CMP     Component Value Date/Time   NA 136 01/07/2024 1834   NA 140 07/14/2023 1037   K 4.1 01/07/2024 1834   CL 106 01/07/2024 1834   CO2 21 (L) 01/07/2024 1834   GLUCOSE 136 (H) 01/07/2024 1834   BUN 32 (H) 01/07/2024 1834   BUN 15 07/14/2023 1037   CREATININE 1.05 (H) 01/07/2024 1834   CALCIUM 9.0 01/07/2024 1834   PROT 7.2 12/23/2023 0626   PROT 7.0 07/14/2023 1037   ALBUMIN 3.7 12/23/2023 0626   ALBUMIN 4.2 07/14/2023 1037   AST 17 12/23/2023 0626   ALT 18 12/23/2023 0626   ALKPHOS 56 12/23/2023 0626   BILITOT 0.7 12/23/2023 0626   BILITOT 0.4 07/14/2023 1037   GFRNONAA >60 01/07/2024 1834   GFRAA 114 11/23/2020 1353     Diabetic Labs (most recent): Lab Results  Component Value Date   HGBA1C 6.8 07/25/2023   HGBA1C 6.5 01/23/2023   HGBA1C 6.8 09/22/2022   MICROALBUR 80 11/26/2020     Lipid Panel ( most recent) Lipid Panel     Component Value Date/Time   CHOL 137 07/14/2023 1037   TRIG 131 07/14/2023 1037   HDL 43 07/14/2023 1037   CHOLHDL 3.2 07/14/2023 1037   CHOLHDL 5.7  01/27/2014 0556   VLDL 24 01/27/2014 0556   LDLCALC 71 07/14/2023 1037   LABVLDL 23 07/14/2023 1037      Lab Results  Component Value Date   TSH 3.010 07/14/2023   TSH 3.650 06/17/2022   TSH 2.640 11/23/2020   TSH 1.738 11/27/2010   FREET4 1.14 07/14/2023   FREET4 0.97 06/17/2022   FREET4 1.07 11/23/2020      Assessment & Plan:   1. Uncontrolled type 2 diabetes mellitus with hyperglycemia (HCC) - Nicholas M Mcclurkin has currently uncontrolled symptomatic type 2 DM since  58 years of age.  She presents with improved and controlled glycemic profile averaging 144 for the last 30 days.  Her point-of-care A1c 6.6% improved from 7.5%.    She did not document hypoglycemia.      Recent labs reviewed. - I had a long discussion with her about the progressive nature of diabetes and the pathology behind its complications. -She did not report gross complications from her diabetes, however she remains at a high risk for more acute and chronic complications which include CAD, CVA, CKD, retinopathy, and neuropathy. These are all discussed in detail with her.  - I have counseled her on diet  and weight management  by adopting a carbohydrate restricted/protein rich  diet. Patient is encouraged to switch to  unprocessed or minimally processed     complex starch and increased protein intake (animal or plant source), fruits, and vegetables. -  she is advised to stick to a routine mealtimes to eat 3 meals  a day and avoid unnecessary snacks ( to snack only to correct hypoglycemia).   - she acknowledges that there is a room for improvement in her food and drink choices. - Suggestion is made for her to avoid simple carbohydrates  from her diet including Cakes, Sweet Desserts, Ice Cream, Soda (diet and regular), Sweet Tea, Candies, Chips, Cookies, Store Bought Juices, Alcohol , Artificial Sweeteners,  Coffee Creamer, and "Sugar-free" Products, Lemonade. This will help patient to have more stable blood glucose  profile and potentially avoid unintended weight gain.  The following Lifestyle Medicine recommendations according to American College of Lifestyle Medicine  [facility]) were discussed and and offered to patient and she  agrees to start the journey:  A. Whole Foods, Plant-Based Nutrition comprising of fruits and vegetables, plant-based proteins, whole-grain carbohydrates was discussed in detail with the patient.   A list for source of those nutrients were also provided to the patient.  Patient will use only water or unsweetened tea for hydration. B.  The need to stay away from risky substances including alcohol, smoking; obtaining 7 to 9 hours of restorative sleep, at least 150 minutes of moderate intensity exercise weekly, the importance of healthy social connections,  and stress management techniques were discussed. C.  A full color page of  Calorie density of various food groups per pound showing examples of each food groups was provided to the patient.  - she has been scheduled with Norm Salt, RDN, CDE for diabetes education.  - I have approached her with the following individualized plan to manage  her diabetes and patient agrees:   -In light of her presentation with near target glycemic profile, she will not need prandial insulin for now.  In fact, she is advised to lower her Evaristo Bury to 50 units nightly associated with monitoring of blood glucose twice a day-daily before breakfast and bedtime.    -She did not want to get on a CGM.    - she is encouraged to call clinic for blood glucose levels less than 70 or above 200 mg /dl. -Whole Foods plant predominant lifestyle nutrition was discussed and recommended to her. -She is benefiting and tolerating Jardiance , wishes to increase her dose to 25 mg p.o. daily at breakfast.  Side effects and precautions discussed with her.      - Specific targets for  A1c;  LDL, HDL,  and Triglycerides were discussed with the patient.  2) Blood Pressure  /Hypertension:  Her blood pressure is controlled to target.  She has a slightly above target urinary microalbumin.  she is advised to continue her current medications including losartan/HCTZ at 50/12.5 mg p.o. daily.  This medication was adjusted from a higher dose of 100/25 mg.  She will be considered for low-dose of this medication on her next refills.   3) Lipids/Hyperlipidemia: Review of her previsit labs show controlled LDL at 71.  She is advised to continue Crestor 10 mg p.o. nightly.      The above mentioned whole food plant-based diet plan will also help with dyslipidemia.     4)  Weight/Diet:  Body mass index is 35.39 kg/m.  -   clearly complicating her diabetes care.   she is  a candidate for  weight loss. I discussed with her the fact that loss of 5 - 10% of her  current body weight will have the most impact on her diabetes management.  Exercise, and detailed carbohydrates information provided  -  detailed on discharge instructions.  5) Chronic Care/Health Maintenance:  -she  ison ACEI/ARB and Statin medications and  is encouraged to initiate and continue to follow up with Ophthalmology, Dentist,  Podiatrist at least yearly or according to recommendations, and advised to  stay away from smoking. I have recommended yearly flu vaccine and pneumonia vaccine at least every 5 years; moderate intensity exercise for up to 150 minutes weekly; and  sleep for at least 7 hours a day. She is advised to stay on vitamin D3 2000 units p.o. daily. Her screening ABI was negative for PAD in February 2022.  This study will be repeated in February 2027, or sooner if needed.   - she is  advised to maintain close follow up with Assunta Found, MD for primary care needs, as well as her other providers for optimal and coordinated care.   I spent  26  minutes in the care of the patient today including review of labs from CMP, Lipids, Thyroid Function, Hematology (current and previous including abstractions  from other facilities); face-to-face time discussing  her blood glucose readings/logs, discussing hypoglycemia and hyperglycemia episodes and symptoms, medications doses, her options of short and long term treatment based on the latest standards of care / guidelines;  discussion about incorporating lifestyle medicine;  and documenting the encounter. Risk reduction counseling performed per USPSTF guidelines to reduce  obesity and cardiovascular risk factors.     Please refer to Patient Instructions for Blood Glucose Monitoring and Insulin/Medications Dosing Guide"  in media tab for additional information. Please  also refer to " Patient Self Inventory" in the Media  tab for reviewed elements of pertinent patient history.  Thompson Caul Lich participated in the discussions, expressed understanding, and voiced agreement with the above plans.  All questions were answered to her satisfaction. she is encouraged to contact clinic should she have any questions or concerns prior to her return visit.   Follow up plan: - Return in about 6 months (around 07/25/2024) for Bring Meter/CGM Device/Logs- A1c in Office.  Marquis Lunch, MD [facility] [address] [place], [place] [address] Phone: [phone]  Fax: [phone]    01/24/2024, 7:02 PM  This note was partially dictated with voice recognition software. Similar sounding words can be transcribed inadequately or may not  be corrected upon review.

## 2024-01-26 ENCOUNTER — Other Ambulatory Visit: Payer: Self-pay | Admitting: "Endocrinology

## 2024-01-31 ENCOUNTER — Ambulatory Visit
Admission: RE | Admit: 2024-01-31 | Discharge: 2024-01-31 | Disposition: A | Discharge status: Home / Self Care | Source: Ambulatory Visit | Attending: Obstetrics and Gynecology | Admitting: Obstetrics and Gynecology

## 2024-01-31 DIAGNOSIS — Z1231 Encounter for screening mammogram for malignant neoplasm of breast: Secondary | ICD-10-CM

## 2024-02-14 DIAGNOSIS — N201 Calculus of ureter: Secondary | ICD-10-CM | POA: Diagnosis not present

## 2024-02-19 DIAGNOSIS — M159 Polyosteoarthritis, unspecified: Secondary | ICD-10-CM | POA: Diagnosis not present

## 2024-02-19 DIAGNOSIS — M5126 Other intervertebral disc displacement, lumbar region: Secondary | ICD-10-CM | POA: Diagnosis not present

## 2024-02-19 DIAGNOSIS — G510 Bell's palsy: Secondary | ICD-10-CM | POA: Diagnosis not present

## 2024-02-19 DIAGNOSIS — Z6836 Body mass index (BMI) 36.0-36.9, adult: Secondary | ICD-10-CM | POA: Diagnosis not present

## 2024-02-19 DIAGNOSIS — M546 Pain in thoracic spine: Secondary | ICD-10-CM | POA: Diagnosis not present

## 2024-02-19 DIAGNOSIS — E6609 Other obesity due to excess calories: Secondary | ICD-10-CM | POA: Diagnosis not present

## 2024-02-19 DIAGNOSIS — G4733 Obstructive sleep apnea (adult) (pediatric): Secondary | ICD-10-CM | POA: Diagnosis not present

## 2024-02-27 DIAGNOSIS — G4733 Obstructive sleep apnea (adult) (pediatric): Secondary | ICD-10-CM | POA: Diagnosis not present

## 2024-03-05 ENCOUNTER — Other Ambulatory Visit: Payer: Self-pay | Admitting: "Endocrinology

## 2024-03-15 DIAGNOSIS — N201 Calculus of ureter: Secondary | ICD-10-CM | POA: Diagnosis not present

## 2024-03-15 DIAGNOSIS — N3946 Mixed incontinence: Secondary | ICD-10-CM | POA: Diagnosis not present

## 2024-03-19 DIAGNOSIS — Z6836 Body mass index (BMI) 36.0-36.9, adult: Secondary | ICD-10-CM | POA: Diagnosis not present

## 2024-03-19 DIAGNOSIS — Z1151 Encounter for screening for human papillomavirus (HPV): Secondary | ICD-10-CM | POA: Diagnosis not present

## 2024-03-19 DIAGNOSIS — Z01419 Encounter for gynecological examination (general) (routine) without abnormal findings: Secondary | ICD-10-CM | POA: Diagnosis not present

## 2024-03-19 DIAGNOSIS — Z130 Encounter for screening for diseases of the blood and blood-forming organs and certain disorders involving the immune mechanism: Secondary | ICD-10-CM | POA: Diagnosis not present

## 2024-03-27 ENCOUNTER — Other Ambulatory Visit: Payer: Self-pay | Admitting: "Endocrinology

## 2024-03-29 DIAGNOSIS — G4733 Obstructive sleep apnea (adult) (pediatric): Secondary | ICD-10-CM | POA: Diagnosis not present

## 2024-04-17 ENCOUNTER — Ambulatory Visit
Admission: EM | Admit: 2024-04-17 | Discharge: 2024-04-17 | Disposition: A | Discharge status: Home / Self Care | Attending: Nurse Practitioner | Admitting: Nurse Practitioner

## 2024-04-17 ENCOUNTER — Institutional Professional Consult (permissible substitution): Admitting: Neurology

## 2024-04-17 DIAGNOSIS — B349 Viral infection, unspecified: Secondary | ICD-10-CM | POA: Insufficient documentation

## 2024-04-17 DIAGNOSIS — J029 Acute pharyngitis, unspecified: Secondary | ICD-10-CM | POA: Insufficient documentation

## 2024-04-17 LAB — POC SARS CORONAVIRUS 2 AG -  ED: SARS Coronavirus 2 Ag: NEGATIVE

## 2024-04-17 LAB — POCT RAPID STREP A (OFFICE): Rapid Strep A Screen: NEGATIVE

## 2024-04-17 MED ORDER — LIDOCAINE VISCOUS HCL 2 % MT SOLN
OROMUCOSAL | 0 refills | Status: DC
Start: 2024-04-17 — End: 2024-05-13

## 2024-04-17 NOTE — ED Triage Notes (Signed)
 Pt reports she has a sore throat x 4 days

## 2024-04-17 NOTE — ED Provider Notes (Signed)
 [facility]    CSN: [id number] Arrival date & time: 04/17/24  1753      History   Chief Complaint No chief complaint on file.   HPI Victoria Williams is a 58 y.o. female.   The history is provided by the patient.   Patient presents with a 4-day history of sore throat.  She also complains of discomfort in the right ear.  Patient denies fever, chills, headache, nasal congestion, runny nose, cough, abdominal pain, nausea, vomiting, diarrhea, or rash.  Patient states she has been taking over-the-counter Chloraseptic throat spray, Advil, Tylenol , and also performing warm salt water  gargles for her throat pain.  Patient denies any obvious known sick contacts.  Rates throat pain 5/10 at present. Past Medical History:  Diagnosis Date   Arthritis    Bell's palsy    approx 2015   Chronic kidney disease    Diabetes mellitus    only when on steriods   Esophagitis 2012   EGD   Family history of adverse reaction to anesthesia    PONV and hallucinations-MOM   Henoch-Schonlein purpura (HCC)    History of kidney stones    History of UTI    Hypertension    IBS (irritable bowel syndrome)    Obesity    Sleep apnea     Patient Active Problem List   Diagnosis Date Noted   Class 2 severe obesity due to excess calories with serious comorbidity and body mass index (BMI) of 37.0 to 37.9 in adult (HCC) 06/21/2022   Vitamin D  deficiency 08/26/2020   Essential hypertension, benign 08/26/2020   Uncontrolled type 2 diabetes mellitus with hyperglycemia (HCC) 07/01/2020   Mixed hyperlipidemia 07/01/2020   Constipation 02/17/2020   History of adenomatous polyp of colon 02/17/2020   Gallbladder sludge 12/17/2019   Facial paralysis/Bells palsy 01/27/2014   Facial droop 01/26/2014   Facial weakness 01/26/2014   Bloating 09/30/2013   Loss of weight 09/30/2013   Abdominal pain, other specified site 09/30/2013   Abdominal pain, epigastric 02/10/2011   Henoch-Schonlein purpura (HCC)  02/10/2011   Diabetes mellitus without mention of complication 02/10/2011    Past Surgical History:  Procedure Laterality Date   ABDOMINAL HYSTERECTOMY  2003   ACHILLES TENDON SURGERY Left    CESAREAN SECTION     CHOLECYSTECTOMY N/A 01/03/2020   Procedure: LAPAROSCOPIC CHOLECYSTECTOMY;  Surgeon: Kallie Manuelita BROCKS, MD;  Location: [facility];  Service: General;  Laterality: N/A;   CYSTOSCOPY/URETEROSCOPY/HOLMIUM LASER/STENT PLACEMENT Left 01/08/2024   Procedure: CYSTOSCOPY/URETEROSCOPY/HOLMIUM LASER/STENT PLACEMENT;  Surgeon: Shane Steffan BROCKS, MD;  Location: [facility];  Service: Urology;  Laterality: Left;   KNEE SURGERY Left    arthroscopic   NECK SURGERY  2000   cervical fusion 6/7   REPLACEMENT TOTAL KNEE Left 2019   [facility]   TONSILECTOMY, ADENOIDECTOMY, BILATERAL MYRINGOTOMY AND TUBES     TONSILLECTOMY     TUBAL LIGATION  1996    OB History   No obstetric history on file.      Home Medications    Prior to Admission medications   Medication Sig Start Date End Date Taking? Authorizing Provider  lidocaine  (XYLOCAINE ) 2 % solution Gargle and spit 5 mL every 6 hours as needed for throat pain or discomfort. 04/17/24  Yes Leath-Warren, Etta PARAS, NP  ACCU-CHEK GUIDE TEST test strip USE TO TEST BLOOD SUGAR FOUR TIMES DAILY AS DIRECTED. 03/28/24   Nida, Gebreselassie W, MD  Accu-Chek Softclix Lancets lancets Use as instructed 01/05/23   Nida,  Ethelle ORN, MD  acetaminophen  (TYLENOL ) 500 MG tablet Take 1,000 mg by mouth 2 (two) times daily as needed for headache.    [provider]  B-D ULTRAFINE III SHORT PEN 31G X 8 MM MISC USE ONCE DAILY 05/26/23   Nida, Gebreselassie W, MD  Blood Glucose Monitoring Suppl (ACCU-CHEK GUIDE ME) w/Device KIT 1 Piece by Does not apply route as directed. 09/22/22   Nida, Gebreselassie W, MD  calcium  carbonate (TUMS - DOSED IN MG ELEMENTAL CALCIUM ) 500 MG chewable tablet Chew 1 tablet by mouth daily as needed for indigestion or heartburn.    [provider]  cholecalciferol (VITAMIN D3) 25 MCG (1000 UNIT) tablet Take 2,000 Units by mouth daily.    [provider]  diclofenac  Sodium (VOLTAREN  ARTHRITIS PAIN) 1 % GEL Apply 4 g topically 4 (four) times daily. 09/11/23   Leath-Warren, Etta PARAS, NP  dicyclomine (BENTYL) 10 MG capsule Take 10 mg by mouth 2 (two) times daily.  03/14/18   [provider]  empagliflozin  (JARDIANCE ) 25 MG TABS tablet Take 1 tablet (25 mg total) by mouth daily with breakfast. 01/24/24   Nida, Ethelle ORN, MD  hyoscyamine  (ANASPAZ ) 0.125 MG TBDP disintergrating tablet Place 1 tablet (0.125 mg total) under the tongue every 4 (four) hours as needed for up to 7 days. For bladder spasms 01/08/24 01/15/24  Ulis Agent, MD  ibuprofen (ADVIL,MOTRIN) 200 MG tablet Take 600 mg by mouth 2 (two) times daily as needed for moderate pain.    [provider]  insulin  degludec (TRESIBA  FLEXTOUCH) 100 UNIT/ML FlexTouch Pen Inject 50 Units into the skin at bedtime. ADMINISTER 60 UNITS UNDER THE SKIN AT BEDTIME 03/08/24   Nida, Gebreselassie W, MD  losartan -hydrochlorothiazide (HYZAAR) 50-12.5 MG tablet Take 1 tablet by mouth daily. 09/22/22   Nida, Gebreselassie W, MD  ondansetron  (ZOFRAN -ODT) 4 MG disintegrating tablet 4mg  ODT q4 hours prn nausea/vomit 12/23/23   Zammit, Joseph, MD  oxyCODONE -acetaminophen  (PERCOCET/ROXICET) 5-325 MG tablet Take 1 tablet by mouth every 6 (six) hours as needed for severe pain (pain score 7-10). 01/07/24   Garrick Charleston, MD  phenazopyridine  (PYRIDIUM ) 200 MG tablet Take 1 tablet (200 mg total) by mouth 3 (three) times daily as needed for pain. 01/08/24 01/07/25  Ulis Agent, MD  rosuvastatin  (CRESTOR ) 10 MG tablet TAKE 1 TABLET(10 MG) BY MOUTH DAILY 01/26/24   Nida, Gebreselassie W, MD  tolterodine (DETROL) 2 MG tablet Take 2 mg by mouth 2 (two) times daily.    [provider]    Family History Family History  Problem Relation Age of Onset   Leukemia Mother     Diabetes Mother    Breast cancer Mother    Hypertension Mother    Stroke Mother    Esophageal cancer Brother    Diabetes Maternal Aunt    Diabetes Maternal Uncle    Diabetes Maternal Grandfather    Heart attack Father    Heart failure Father    Colon cancer Neg Hx     Social History Social History   Tobacco Use   Smoking status: Never    Passive exposure: Never   Smokeless tobacco: Never  Vaping Use   Vaping status: Never Used  Substance Use Topics   Alcohol use: Yes    Alcohol/week: 1.0 standard drink of alcohol    Types: 1 Glasses of wine per week    Comment: 2 a month    Drug use: No     Allergies   Hydromorphone  hcl,  Sulfa  antibiotics, Cephalexin, Ciprocin-fluocin-procin [fluocinolone acetonide], Bee venom, Levofloxacin, Celebrex [celecoxib], and Penicillins   Review of Systems Review of Systems Per HPI  Physical Exam Triage Vital Signs ED Triage Vitals [04/17/24 1810]  Encounter Vitals Group     BP 108/69     Girls Systolic BP Percentile      Girls Diastolic BP Percentile      Boys Systolic BP Percentile      Boys Diastolic BP Percentile      Pulse Rate 79     Resp 20     Temp 98.7 F (37.1 C)     Temp Source Oral     SpO2 94 %     Weight      Height      Head Circumference      Peak Flow      Pain Score 5     Pain Loc      Pain Education      Exclude from Growth Chart    No data found.  Updated Vital Signs BP 108/69 (BP Location: Right Arm)   Pulse 79   Temp 98.7 F (37.1 C) (Oral)   Resp 20   SpO2 94%   Visual Acuity Right Eye Distance:   Left Eye Distance:   Bilateral Distance:    Right Eye Near:   Left Eye Near:    Bilateral Near:     Physical Exam Vitals and nursing note reviewed.  Constitutional:      General: She is not in acute distress.    Appearance: Normal appearance.  HENT:     Head: Normocephalic.     Right Ear: Tympanic membrane, ear canal and external ear normal.     Left Ear: Tympanic membrane, ear canal  and external ear normal.     Nose: Nose normal.     Right Turbinates: Enlarged and swollen.     Left Turbinates: Enlarged and swollen.     Mouth/Throat:     Lips: Pink.     Mouth: Mucous membranes are moist.     Pharynx: Posterior oropharyngeal erythema and postnasal drip present. No pharyngeal swelling, oropharyngeal exudate or uvula swelling.     Comments: Cobblestoning present to posterior oropharynx   Eyes:     Extraocular Movements: Extraocular movements intact.     Conjunctiva/sclera: Conjunctivae normal.     Pupils: Pupils are equal, round, and reactive to light.    Cardiovascular:     Rate and Rhythm: Normal rate and regular rhythm.     Pulses: Normal pulses.     Heart sounds: Normal heart sounds.  Pulmonary:     Effort: Pulmonary effort is normal. No respiratory distress.     Breath sounds: Normal breath sounds. No stridor. No wheezing, rhonchi or rales.  Abdominal:     Palpations: Abdomen is soft.     Tenderness: There is no abdominal tenderness.   Musculoskeletal:     Cervical back: Normal range of motion.  Lymphadenopathy:     Cervical: No cervical adenopathy.   Skin:    General: Skin is warm and dry.   Neurological:     General: No focal deficit present.     Mental Status: She is alert and oriented to person, place, and time.   Psychiatric:        Mood and Affect: Mood normal.        Behavior: Behavior normal.      UC Treatments / Results  Labs (all labs ordered are listed,  but only abnormal results are displayed) Labs Reviewed  CULTURE, GROUP A STREP [facility])  POCT RAPID STREP A (OFFICE)  POC SARS CORONAVIRUS 2 AG -  ED    EKG   Radiology No results found.  Procedures Procedures (including critical care time)  Medications Ordered in UC Medications - No data to display  Initial Impression / Assessment and Plan / UC Course  I have reviewed the triage vital signs and the nursing notes.  Pertinent labs & imaging results that were available  during my care of the patient were reviewed by me and considered in my medical decision making (see chart for details).  The rapid strep test and COVID test were negative.  Throat culture is pending.  The patient is well-appearing, vital signs are stable, lung sounds are clear throughout.  Symptoms consistent with viral etiology.  Will provide symptomatic treatment with viscous lidocaine  2% for patient to gargle and spit for throat pain or discomfort.  Supportive care recommendations were provided and discussed with the patient to include fluids, rest, over-the-counter analgesics, and continuing current over-the-counter remedies she is using at home.  Patient was given indications regarding follow-up.  Patient was in agreement with this plan of care and verbalizes understanding.  All questions were answered.  Patient stable for discharge.  Final Clinical Impressions(s) / UC Diagnoses   Final diagnoses:  Sore throat  Viral illness     Discharge Instructions      The rapid strep test and COVID test were negative.  A throat culture has been ordered.  You will be contacted if the pending test result is abnormal.  You will also have access to the results via MyChart. Take medication as prescribed. Increase fluids and allow for plenty of rest. You may continue over-the-counter Tylenol  or ibuprofen as needed for pain, fever, or general discomfort. Continue warm salt water  gargles at least 3-4 times daily while symptoms persist. Also continue use of Chloraseptic throat spray or throat lozenges.  Also recommend the use of honey or warm teas while symptoms persist. If symptoms do not improve over the next 5 to 7 days, or appear to be worsening, you may follow-up in this clinic or with your primary care physician for further evaluation. Follow-up as needed.     ED Prescriptions     Medication Sig Dispense Auth. Provider   lidocaine  (XYLOCAINE ) 2 % solution Gargle and spit 5 mL every 6 hours as  needed for throat pain or discomfort. 100 mL Leath-Warren, Etta PARAS, NP      PDMP not reviewed this encounter.   Gilmer Etta PARAS, NP 04/17/24 1845

## 2024-04-17 NOTE — Discharge Instructions (Addendum)
 The rapid strep test and COVID test were negative.  A throat culture has been ordered.  You will be contacted if the pending test result is abnormal.  You will also have access to the results via MyChart. Take medication as prescribed. Increase fluids and allow for plenty of rest. You may continue over-the-counter Tylenol  or ibuprofen as needed for pain, fever, or general discomfort. Continue warm salt water  gargles at least 3-4 times daily while symptoms persist. Also continue use of Chloraseptic throat spray or throat lozenges.  Also recommend the use of honey or warm teas while symptoms persist. If symptoms do not improve over the next 5 to 7 days, or appear to be worsening, you may follow-up in this clinic or with your primary care physician for further evaluation. Follow-up as needed.

## 2024-04-18 LAB — CULTURE, GROUP A STREP (THRC)

## 2024-04-20 LAB — CULTURE, GROUP A STREP (THRC)

## 2024-04-22 ENCOUNTER — Ambulatory Visit: Payer: Self-pay

## 2024-04-28 DIAGNOSIS — G4733 Obstructive sleep apnea (adult) (pediatric): Secondary | ICD-10-CM | POA: Diagnosis not present

## 2024-05-07 ENCOUNTER — Telehealth: Payer: Self-pay | Admitting: *Deleted

## 2024-05-07 ENCOUNTER — Ambulatory Visit (INDEPENDENT_AMBULATORY_CARE_PROVIDER_SITE_OTHER): Payer: Self-pay | Admitting: Neurology

## 2024-05-07 DIAGNOSIS — Z0289 Encounter for other administrative examinations: Secondary | ICD-10-CM

## 2024-05-07 NOTE — Progress Notes (Signed)
 Patient does not wish to be seen for sleep apnea.  I had evaluated her several years ago for OSA.  She has been re-referred by her PCP for memory issues to Dr. Margaret, whom she has seen for memory loss before.  Patient is scheduled in August for this appointment. She does not wish to follow-up in our clinic for sleep apnea at this time as she is up-to-date with her supplies through PCP and reports compliance and no issues with her current PAP machine.  We will cancel appointment, no charge.

## 2024-05-07 NOTE — Telephone Encounter (Signed)
 Patient here for sleep consult today but thought she was here for memory. That is her biggest concern. She states PCP told her he was fine with refilling her cpap supplies so she is unsure why a referral was sent. I encouraged her to talk to him about this. She would like to cancel today's appt as she feels her cpap machine is working fine, she states she uses it regularly and she is unemployed. Her check-in today was reversed and she was scheduled with Dr Margaret for the next availability on 06/06/24 at 230 pm check-in 215. Of note, pt states she got lost recently while trying to visit a friend for lunch. She was supposed to go to [place] and she ended up in [place]. She is also forgetting if she's taken her meds. Patient was appreciative for the appt with Dr Margaret. I have her on the wait list too.

## 2024-05-09 ENCOUNTER — Telehealth: Payer: Self-pay | Admitting: Diagnostic Neuroimaging

## 2024-05-09 NOTE — Telephone Encounter (Signed)
 Sent mychart msg offering sooner appointment with Dr. Margaret

## 2024-05-13 ENCOUNTER — Encounter: Payer: Self-pay | Admitting: Diagnostic Neuroimaging

## 2024-05-13 ENCOUNTER — Ambulatory Visit: Admitting: Diagnostic Neuroimaging

## 2024-05-13 VITALS — BP 108/64 | HR 67 | Ht 64.0 in | Wt 204.0 lb

## 2024-05-13 DIAGNOSIS — R413 Other amnesia: Secondary | ICD-10-CM

## 2024-05-13 NOTE — Patient Instructions (Signed)
 MEMORY LOSS (mild memory impairment; MoCA 27/30; normal exam; possibly undiagnosed ADHD) - consider psychology evaluation for ADHD (undiagnosed) - brain healthy activities reviewed (nutrition, physical activity, cognitive stimulation, stress mgmt)

## 2024-05-13 NOTE — Progress Notes (Signed)
 [facility]  PATIENT: Victoria Williams DOB: 15-Nov-1965  REFERRING CLINICIAN: Marvine Rush, MD   HISTORY FROM: patient  REASON FOR VISIT: new consult    HISTORICAL  CHIEF COMPLAINT:  Chief Complaint  Patient presents with   Memory Loss    Rm 6 alone Pt is well, reports she has noticed her memory concerns has been more frequently. She has been more confused while driving and getting loss. Does do ALDs alone     HISTORY OF PRESENT ILLNESS:   UPDATE (05/13/24, VRP): Since last visit, having ongoing memory issues, gotten lost driving. Sleep and stress are better. Does not some ADHD tendencies, never officially diagnosed. Did have diff taking tests in childhood. Son has ADHD and this reminds patient of herself. No major changes in ADLs.   PRIOR HPI ([id number]): 58 year old female here for evaluation of headaches and memory loss.  History of hypertension and diabetes.  History of kidney stones.  Her past 8 to 9 months patient has noticed some intermittent short-term memory problems, decreased attention and focus, feeling scattered, at home and at work.  She has been under more stress at work over the past 1 year.  Sometimes she goes from one room to the next forgets why she went there.  Sometimes she goes shopping and forgets to purchase an item.  She has been trying to use lists, but sometimes she forgets to take the list with her.  Patient has intermittent headaches, global, top of her head, throbbing and pounding sensation with pressure, lasting hours at a time, associated with nausea.  No photophobia or phonophobia.  Headaches occur 1-2 times per month.  Several months ago these flared up and were occurring 5 times per week.  Patient usually takes Tylenol  or ibuprofen with fairly good relief.  No family history of migraine.  Patient also has problems with her sleep including insomnia, difficulty falling asleep, difficulty staying asleep, snoring, as well as witnessed apnea  during her recent left knee surgery in May 2019.  Patient also has chronic incontinence issues and chronic pain in her joints.  Patient had left-sided Bell's palsy in 2015 now with intermittent left facial twitching.   REVIEW OF SYSTEMS: Full 14 system review of systems performed and negative with exception of: Blurred vision memory loss insomnia snoring restless legs not enough sleep allergies aching muscles incontinence hearing loss increased thirst.  ALLERGIES: Allergies  Allergen Reactions   Hydromorphone  Hcl Nausea And Vomiting   Sulfa  Antibiotics Nausea And Vomiting   Cephalexin Hives   Ciprocin-Fluocin-Procin [Fluocinolone Acetonide] Hives   Bee Venom Swelling    REACTION: Severe swelling   Levofloxacin Hives   Celebrex [Celecoxib] Rash    itching   Penicillins Rash    Did it involve swelling of the face/tongue/throat, SOB, or low BP? No Did it involve sudden or severe rash/hives, skin peeling, or any reaction on the inside of your mouth or nose? Yes Did you need to seek medical attention at a hospital or doctor's office? Unknown When did it last happen? childhood       If all above answers are "NO", may proceed with cephalosporin use.     HOME MEDICATIONS: Outpatient Medications Prior to Visit  Medication Sig Dispense Refill   ACCU-CHEK GUIDE TEST test strip USE TO TEST BLOOD SUGAR FOUR TIMES DAILY AS DIRECTED. 150 strip 2   Accu-Chek Softclix Lancets lancets Use as instructed 100 each 2   acetaminophen  (TYLENOL ) 500 MG tablet Take 1,000 mg by mouth  2 (two) times daily as needed for headache.     B-D ULTRAFINE III SHORT PEN 31G X 8 MM MISC USE ONCE DAILY 100 each 3   Blood Glucose Monitoring Suppl (ACCU-CHEK GUIDE ME) w/Device KIT 1 Piece by Does not apply route as directed. 1 kit 0   calcium  carbonate (TUMS - DOSED IN MG ELEMENTAL CALCIUM ) 500 MG chewable tablet Chew 1 tablet by mouth daily as needed for indigestion or heartburn.     cholecalciferol (VITAMIN D3) 25 MCG  (1000 UNIT) tablet Take 2,000 Units by mouth daily.     dicyclomine (BENTYL) 10 MG capsule Take 10 mg by mouth 2 (two) times daily.   0   empagliflozin  (JARDIANCE ) 25 MG TABS tablet Take 1 tablet (25 mg total) by mouth daily with breakfast. 90 tablet 1   ibuprofen (ADVIL,MOTRIN) 200 MG tablet Take 600 mg by mouth 2 (two) times daily as needed for moderate pain.     insulin  degludec (TRESIBA  FLEXTOUCH) 100 UNIT/ML FlexTouch Pen Inject 50 Units into the skin at bedtime. ADMINISTER 60 UNITS UNDER THE SKIN AT BEDTIME 45 mL 0   losartan -hydrochlorothiazide (HYZAAR) 50-12.5 MG tablet Take 1 tablet by mouth daily. 90 tablet 1   rosuvastatin  (CRESTOR ) 10 MG tablet TAKE 1 TABLET(10 MG) BY MOUTH DAILY 90 tablet 1   tolterodine (DETROL) 2 MG tablet Take 2 mg by mouth 2 (two) times daily.     diclofenac  Sodium (VOLTAREN  ARTHRITIS PAIN) 1 % GEL Apply 4 g topically 4 (four) times daily. 150 g 0   hyoscyamine  (ANASPAZ ) 0.125 MG TBDP disintergrating tablet Place 1 tablet (0.125 mg total) under the tongue every 4 (four) hours as needed for up to 7 days. For bladder spasms 40 tablet 0   lidocaine  (XYLOCAINE ) 2 % solution Gargle and spit 5 mL every 6 hours as needed for throat pain or discomfort. 100 mL 0   ondansetron  (ZOFRAN -ODT) 4 MG disintegrating tablet 4mg  ODT q4 hours prn nausea/vomit 10 tablet 0   oxyCODONE -acetaminophen  (PERCOCET/ROXICET) 5-325 MG tablet Take 1 tablet by mouth every 6 (six) hours as needed for severe pain (pain score 7-10). 12 tablet 0   phenazopyridine  (PYRIDIUM ) 200 MG tablet Take 1 tablet (200 mg total) by mouth 3 (three) times daily as needed for pain. 20 tablet 0   No facility-administered medications prior to visit.    PAST MEDICAL HISTORY: Past Medical History:  Diagnosis Date   Arthritis    Bell's palsy    approx 2015   Chronic kidney disease    Diabetes mellitus    only when on steriods   Esophagitis 2012   EGD   Family history of adverse reaction to anesthesia    PONV  and hallucinations-MOM   Henoch-Schonlein purpura (HCC)    History of kidney stones    History of UTI    Hypertension    IBS (irritable bowel syndrome)    Obesity    Sleep apnea     PAST SURGICAL HISTORY: Past Surgical History:  Procedure Laterality Date   ABDOMINAL HYSTERECTOMY  2003   ACHILLES TENDON SURGERY Left    CESAREAN SECTION     CHOLECYSTECTOMY N/A 01/03/2020   Procedure: LAPAROSCOPIC CHOLECYSTECTOMY;  Surgeon: Kallie Manuelita BROCKS, MD;  Location: [facility];  Service: General;  Laterality: N/A;   CYSTOSCOPY/URETEROSCOPY/HOLMIUM LASER/STENT PLACEMENT Left 01/08/2024   Procedure: CYSTOSCOPY/URETEROSCOPY/HOLMIUM LASER/STENT PLACEMENT;  Surgeon: Shane Steffan BROCKS, MD;  Location: [facility];  Service: Urology;  Laterality: Left;   KNEE SURGERY Left  arthroscopic   NECK SURGERY  2000   cervical fusion 6/7   REPLACEMENT TOTAL KNEE Left 2019   [facility]   TONSILECTOMY, ADENOIDECTOMY, BILATERAL MYRINGOTOMY AND TUBES     TONSILLECTOMY     TUBAL LIGATION  1996    FAMILY HISTORY: Family History  Problem Relation Age of Onset   Leukemia Mother    Diabetes Mother    Breast cancer Mother    Hypertension Mother    Stroke Mother    Esophageal cancer Brother    Diabetes Maternal Aunt    Diabetes Maternal Uncle    Diabetes Maternal Grandfather    Heart attack Father    Heart failure Father    Colon cancer Neg Hx     SOCIAL HISTORY:  Social History   Socioeconomic History   Marital status: Married    Spouse name: Not on file   Number of children: 3   Years of education: some college   Highest education level: Not on file  Occupational History   Occupation: unemployed  Tobacco Use   Smoking status: Never    Passive exposure: Never   Smokeless tobacco: Never  Vaping Use   Vaping status: Never Used  Substance and Sexual Activity   Alcohol use: Yes    Alcohol/week: 1.0 standard drink of alcohol    Types: 1 Glasses of wine per week    Comment: 2 a month    Drug use: No    Sexual activity: Yes    Birth control/protection: Surgical  Other Topics Concern   Not on file  Social History Narrative   Lives with spouse   2 caffeine drinks daily    Social Drivers of Health   Financial Resource Strain: Low Risk  (06/01/2023)   Received from [facility]   Overall Financial Resource Strain (CARDIA)    Difficulty of Paying Living Expenses: Not hard at all  Food Insecurity: No Food Insecurity (06/01/2023)   Received from [facility]   Hunger Vital Sign    Within the past 12 months, you worried that your food would run out before you got the money to buy more.: Never true    Within the past 12 months, the food you bought just didn't last and you didn't have money to get more.: Never true  Transportation Needs: No Transportation Needs (06/01/2023)   Received from [facility] - Transportation    Lack of Transportation (Medical): No    Lack of Transportation (Non-Medical): No  Physical Activity: Insufficiently Active (06/01/2023)   Received from [facility]   Exercise Vital Sign    On average, how many days per week do you engage in moderate to strenuous exercise (like a brisk walk)?: 1 day    On average, how many minutes do you engage in exercise at this level?: 20 min  Stress: No Stress Concern Present (06/01/2023)   Received from [facility] - Occupational Stress Questionnaire    Feeling of Stress : Not at all  Social Connections: Socially Integrated (06/01/2023)   Received from [facility]   Social Network    How would you rate your social network (family, work, friends)?: Good participation with social networks  Intimate Partner Violence: Not At Risk (06/01/2023)   Received from [facility]   HITS    Over the last 12 months how often did your partner physically hurt you?: Never    Over the last 12 months how often did  your partner insult you or talk down to you?: Never    Over the last 12 months how  often did your partner threaten you with physical harm?: Never    Over the last 12 months how often did your partner scream or curse at you?: Never     PHYSICAL EXAM  GENERAL EXAM/CONSTITUTIONAL: Vitals:  Vitals:   05/13/24 1451  BP: 108/64  Pulse: 67  Weight: 204 lb (92.5 kg)  Height: 5' 4 (1.626 m)   Body mass index is 35.02 kg/m. No results found.  Patient is in no distress; well developed, nourished and groomed; neck is supple  CARDIOVASCULAR: Examination of carotid arteries is normal; no carotid bruits Regular rate and rhythm, no murmurs Examination of peripheral vascular system by observation and palpation is normal  EYES: Ophthalmoscopic exam of optic discs and posterior segments is normal; no papilledema or hemorrhages  MUSCULOSKELETAL: Gait, strength, tone, movements noted in Neurologic exam below  NEUROLOGIC: MENTAL STATUS:     04/18/2018    9:51 AM  MMSE - Mini Mental State Exam  Orientation to time 4  Orientation to Place 5  Registration 3  Attention/ Calculation 5  Recall 3  Language- name 2 objects 2  Language- repeat 0  Language- follow 3 step command 3  Language- read & follow direction 1  Write a sentence 1  Copy design 1  Total score 28   awake, alert, oriented to person, place and time recent and remote memory intact normal attention and concentration language fluent, comprehension intact, naming intact,  fund of knowledge appropriate  CRANIAL NERVE:  2nd - no papilledema on fundoscopic exam 2nd, 3rd, 4th, 6th - pupils equal and reactive to light, visual fields full to confrontation, extraocular muscles intact, no nystagmus 5th - facial sensation symmetric 7th - facial strength --> LEFT UPPER AND LOWER FACIAL WEAKNESS; LEFT SYNKINESIS; LEFT CHIN TWITCHING 8th - hearing intact 9th - palate elevates symmetrically, uvula midline 11th - shoulder shrug symmetric 12th - tongue protrusion midline  MOTOR:  normal bulk and tone, full  strength in the BUE, BLE  SENSORY:  normal and symmetric to light touch, temperature, vibration  COORDINATION:  finger-nose-finger, fine finger movements normal  REFLEXES:  deep tendon reflexes TRACE and symmetric  GAIT/STATION:  narrow based gait    DIAGNOSTIC DATA (LABS, IMAGING, TESTING) - I reviewed patient records, labs, notes, testing and imaging myself where available.  Lab Results  Component Value Date   WBC 14.6 (H) 01/07/2024   HGB 13.9 01/07/2024   HCT 43.4 01/07/2024   MCV 91.8 01/07/2024   PLT 381 01/07/2024      Component Value Date/Time   NA 136 01/07/2024 1834   NA 140 07/14/2023 1037   K 4.1 01/07/2024 1834   CL 106 01/07/2024 1834   CO2 21 (L) 01/07/2024 1834   GLUCOSE 136 (H) 01/07/2024 1834   BUN 32 (H) 01/07/2024 1834   BUN 15 07/14/2023 1037   CREATININE 1.05 (H) 01/07/2024 1834   CALCIUM  9.0 01/07/2024 1834   PROT 7.2 12/23/2023 0626   PROT 7.0 07/14/2023 1037   ALBUMIN 3.7 12/23/2023 0626   ALBUMIN 4.2 07/14/2023 1037   AST 17 12/23/2023 0626   ALT 18 12/23/2023 0626   ALKPHOS 56 12/23/2023 0626   BILITOT 0.7 12/23/2023 0626   BILITOT 0.4 07/14/2023 1037   GFRNONAA >60 01/07/2024 1834   GFRAA 114 11/23/2020 1353   Lab Results  Component Value Date   CHOL 137 07/14/2023  HDL 43 07/14/2023   LDLCALC 71 07/14/2023   TRIG 131 07/14/2023   CHOLHDL 3.2 07/14/2023   Lab Results  Component Value Date   HGBA1C 6.8 07/25/2023   No results found for: [id number] Lab Results  Component Value Date   TSH 3.010 07/14/2023    01/27/14 MRI brain  - Negative MRI of the brain.  04/24/18 MRI brain - normal     ASSESSMENT AND PLAN  58 y.o. year old female here with:    Dx:  1. Mild memory disturbance       PLAN:  MEMORY LOSS (mild memory impairment; MoCA 27/30; normal exam; possibly undiagnosed ADHD) - consider psychology evaluation for ADHD (undiagnosed) - brain healthy activities reviewed (nutrition, physical activity,  cognitive stimulation, stress mgmt)  MIGRAINE HEADACHES WITHOUT AURA (improved) - OTC meds as needed - may consider trial of rizatriptan in future for migraine rescue - may consider propranolol or amitriptyline in future for prevention (cannot take topiramate due to kidney stones)  SNORING / APNEA - follow up with sleep clinic (on CPAP)  No orders of the defined types were placed in this encounter.  No follow-ups on file.    EDUARD FABIENE HANLON, MD 05/13/2024, 3:29 PM Certified in Neurology, Neurophysiology and Neuroimaging  [facility] [address], [address] [place], [place] [address] [phone]

## 2024-05-14 DIAGNOSIS — D582 Other hemoglobinopathies: Secondary | ICD-10-CM | POA: Diagnosis not present

## 2024-05-31 ENCOUNTER — Other Ambulatory Visit: Payer: Self-pay | Admitting: "Endocrinology

## 2024-06-06 ENCOUNTER — Institutional Professional Consult (permissible substitution): Admitting: Diagnostic Neuroimaging

## 2024-06-11 ENCOUNTER — Encounter: Payer: Self-pay | Admitting: Cardiology

## 2024-06-11 ENCOUNTER — Ambulatory Visit: Attending: Cardiology | Admitting: Cardiology

## 2024-06-11 VITALS — BP 103/70 | HR 77 | Ht 64.0 in | Wt 203.0 lb

## 2024-06-11 DIAGNOSIS — E782 Mixed hyperlipidemia: Secondary | ICD-10-CM

## 2024-06-11 DIAGNOSIS — I10 Essential (primary) hypertension: Secondary | ICD-10-CM

## 2024-06-11 DIAGNOSIS — I2510 Atherosclerotic heart disease of native coronary artery without angina pectoris: Secondary | ICD-10-CM | POA: Diagnosis not present

## 2024-06-11 NOTE — Patient Instructions (Signed)

## 2024-06-11 NOTE — Progress Notes (Signed)
 Clinical Summary Ms. Guyton is a 58 y.o.female seen today for follow up of the following medical problems.   1. Coronary atherosclerosis -04/2023  elevated coronary calcium  of 122 - no ASA, GI/esophageal. - taking rosuvastatin .  - no exertional symptoms.    2. HTN - compliant with meds  3. HLD - 06/2023 TC 137 TG 131 HDL 43 LDL 71   4. DM2 - followed by endocrine    CAD risk factors: HTN, DM2 on steroids mainly, father MI 44. Several paternal uncles with MIs.  Past Medical History:  Diagnosis Date   Arthritis    Bell's palsy    approx 2015   Chronic kidney disease    Diabetes mellitus    only when on steriods   Esophagitis 2012   EGD   Family history of adverse reaction to anesthesia    PONV and hallucinations-MOM   Henoch-Schonlein purpura (HCC)    History of kidney stones    History of UTI    Hypertension    IBS (irritable bowel syndrome)    Obesity    Sleep apnea      Allergies  Allergen Reactions   Hydromorphone  Hcl Nausea And Vomiting   Sulfa  Antibiotics Nausea And Vomiting   Cephalexin Hives   Ciprocin-Fluocin-Procin [Fluocinolone Acetonide] Hives   Bee Venom Swelling    REACTION: Severe swelling   Levofloxacin Hives   Celebrex [Celecoxib] Rash    itching   Penicillins Rash    Did it involve swelling of the face/tongue/throat, SOB, or low BP? No Did it involve sudden or severe rash/hives, skin peeling, or any reaction on the inside of your mouth or nose? Yes Did you need to seek medical attention at a hospital or doctor's office? Unknown When did it last happen? childhood       If all above answers are "NO", may proceed with cephalosporin use.      Current Outpatient Medications  Medication Sig Dispense Refill   ACCU-CHEK GUIDE TEST test strip USE TO TEST BLOOD SUGAR FOUR TIMES DAILY AS DIRECTED. 150 strip 2   Accu-Chek Softclix Lancets lancets Use as instructed 100 each 2   acetaminophen  (TYLENOL ) 500 MG tablet Take 1,000 mg by  mouth 2 (two) times daily as needed for headache.     B-D ULTRAFINE III SHORT PEN 31G X 8 MM MISC USE ONCE DAILY 100 each 3   Blood Glucose Monitoring Suppl (ACCU-CHEK GUIDE ME) w/Device KIT 1 Piece by Does not apply route as directed. 1 kit 0   calcium  carbonate (TUMS - DOSED IN MG ELEMENTAL CALCIUM ) 500 MG chewable tablet Chew 1 tablet by mouth daily as needed for indigestion or heartburn.     cholecalciferol (VITAMIN D3) 25 MCG (1000 UNIT) tablet Take 2,000 Units by mouth daily.     dicyclomine (BENTYL) 10 MG capsule Take 10 mg by mouth 2 (two) times daily.   0   empagliflozin  (JARDIANCE ) 25 MG TABS tablet Take 1 tablet (25 mg total) by mouth daily with breakfast. 90 tablet 1   ibuprofen (ADVIL,MOTRIN) 200 MG tablet Take 600 mg by mouth 2 (two) times daily as needed for moderate pain.     insulin  degludec (TRESIBA  FLEXTOUCH) 100 UNIT/ML FlexTouch Pen Inject 50 Units into the skin at bedtime. 45 mL 0   losartan -hydrochlorothiazide (HYZAAR) 50-12.5 MG tablet Take 1 tablet by mouth daily. 90 tablet 1   rosuvastatin  (CRESTOR ) 10 MG tablet TAKE 1 TABLET(10 MG) BY MOUTH DAILY 90 tablet 1  tolterodine (DETROL) 2 MG tablet Take 2 mg by mouth 2 (two) times daily.     No current facility-administered medications for this visit.     Past Surgical History:  Procedure Laterality Date   ABDOMINAL HYSTERECTOMY  2003   ACHILLES TENDON SURGERY Left    CESAREAN SECTION     CHOLECYSTECTOMY N/A 01/03/2020   Procedure: LAPAROSCOPIC CHOLECYSTECTOMY;  Surgeon: Kallie Manuelita BROCKS, MD;  Location: [facility];  Service: General;  Laterality: N/A;   CYSTOSCOPY/URETEROSCOPY/HOLMIUM LASER/STENT PLACEMENT Left 01/08/2024   Procedure: CYSTOSCOPY/URETEROSCOPY/HOLMIUM LASER/STENT PLACEMENT;  Surgeon: Shane Steffan BROCKS, MD;  Location: [facility];  Service: Urology;  Laterality: Left;   KNEE SURGERY Left    arthroscopic   NECK SURGERY  2000   cervical fusion 6/7   REPLACEMENT TOTAL KNEE Left 2019   [facility]   TONSILECTOMY,  ADENOIDECTOMY, BILATERAL MYRINGOTOMY AND TUBES     TONSILLECTOMY     TUBAL LIGATION  1996     Allergies  Allergen Reactions   Hydromorphone  Hcl Nausea And Vomiting   Sulfa  Antibiotics Nausea And Vomiting   Cephalexin Hives   Ciprocin-Fluocin-Procin [Fluocinolone Acetonide] Hives   Bee Venom Swelling    REACTION: Severe swelling   Levofloxacin Hives   Celebrex [Celecoxib] Rash    itching   Penicillins Rash    Did it involve swelling of the face/tongue/throat, SOB, or low BP? No Did it involve sudden or severe rash/hives, skin peeling, or any reaction on the inside of your mouth or nose? Yes Did you need to seek medical attention at a hospital or doctor's office? Unknown When did it last happen? childhood       If all above answers are "NO", may proceed with cephalosporin use.       Family History  Problem Relation Age of Onset   Leukemia Mother    Diabetes Mother    Breast cancer Mother    Hypertension Mother    Stroke Mother    Esophageal cancer Brother    Diabetes Maternal Aunt    Diabetes Maternal Uncle    Diabetes Maternal Grandfather    Heart attack Father    Heart failure Father    Colon cancer Neg Hx      Social History Ms. Rotert reports that she has never smoked. She has never been exposed to tobacco smoke. She has never used smokeless tobacco. Ms. Starnes reports current alcohol use of about 1.0 standard drink of alcohol per week.     Physical Examination Today's Vitals   06/11/24 1525  BP: 103/70  Pulse: 77  SpO2: 95%  Weight: 203 lb (92.1 kg)  Height: 5' 4 (1.626 m)   Body mass index is 34.84 kg/m.  Gen: resting comfortably, no acute distress HEENT: no scleral icterus, pupils equal round and reactive, no palptable cervical adenopathy,  CV: RRR, no mrg, no jvd Resp: Clear to auscultation bilaterally GI: abdomen is soft, non-tender, non-distended, normal bowel sounds, no hepatosplenomegaly MSK: extremities are warm, no edema.  Skin:  warm, no rash Neuro:  no focal deficits Psych: appropriate affect   Diagnostic Studies  04/2015 echo Study Conclusions   - Left ventricle: The cavity size was normal. There was mild    concentric hypertrophy. Systolic function was normal. The    estimated ejection fraction was in the range of 55% to 60%. While    endocardium was suboptimally visualized, all available views    demonstrated grossly normal wall motion. Doppler parameters are    consistent with abnormal left  ventricular relaxation (grade 1    diastolic dysfunction).    04/2015 GXT There was no ST segment deviation noted during stress. Duke treadmill score of 7 portends a favorable prognosis and a low risk of cardiovascular events. Normal exercise treadmill stress test.   Assessment and Plan   1. Coronary atherosclerosis - elevated coronary calcium  score - no exertional symptoms - prior GI issues in the past, have not committed to ASA but could in the future if were to have a cardiovascular event - continue statin, risk factor modification.  - EKG today shows NSR, no acute ischemic changes  2. HLD - at goal, continue current meds  3. HTN -at goal, continue current meds   F/u 1 year     Dorn PHEBE Ross, M.D.

## 2024-06-24 ENCOUNTER — Other Ambulatory Visit: Payer: Self-pay | Admitting: "Endocrinology

## 2024-07-19 ENCOUNTER — Other Ambulatory Visit: Payer: Self-pay | Admitting: "Endocrinology

## 2024-07-25 ENCOUNTER — Ambulatory Visit: Admitting: "Endocrinology

## 2024-07-25 ENCOUNTER — Encounter: Payer: Self-pay | Admitting: "Endocrinology

## 2024-07-25 VITALS — BP 110/68 | HR 68 | Ht 64.0 in | Wt 199.4 lb

## 2024-07-25 DIAGNOSIS — Z6837 Body mass index (BMI) 37.0-37.9, adult: Secondary | ICD-10-CM

## 2024-07-25 DIAGNOSIS — E1165 Type 2 diabetes mellitus with hyperglycemia: Secondary | ICD-10-CM

## 2024-07-25 DIAGNOSIS — E782 Mixed hyperlipidemia: Secondary | ICD-10-CM

## 2024-07-25 DIAGNOSIS — E559 Vitamin D deficiency, unspecified: Secondary | ICD-10-CM | POA: Diagnosis not present

## 2024-07-25 DIAGNOSIS — Z794 Long term (current) use of insulin: Secondary | ICD-10-CM | POA: Insufficient documentation

## 2024-07-25 DIAGNOSIS — I10 Essential (primary) hypertension: Secondary | ICD-10-CM | POA: Diagnosis not present

## 2024-07-25 DIAGNOSIS — E66812 Obesity, class 2: Secondary | ICD-10-CM

## 2024-07-25 LAB — POCT GLYCOSYLATED HEMOGLOBIN (HGB A1C): HbA1c, POC (controlled diabetic range): 7 % (ref 0.0–7.0)

## 2024-07-25 MED ORDER — MOUNJARO 2.5 MG/0.5ML ~~LOC~~ SOAJ: 2.5000 mg | SUBCUTANEOUS | 0 refills | Status: DC

## 2024-07-25 NOTE — Progress Notes (Signed)
 07/25/2024, 3:55 PM   Endocrinology follow-up note  Subjective:    Patient ID: Victoria Williams, female    DOB: Nov 14, 1965.  Victoria Williams is being seen in follow-up after she was seen in consultation for management of currently controlled asymptomatic diabetes requested by  Marvine Rush, MD.   Past Medical History:  Diagnosis Date   Arthritis    Bell's palsy    approx 2015   Chronic kidney disease    Diabetes mellitus    only when on steriods   Esophagitis 2012   EGD   Family history of adverse reaction to anesthesia    PONV and hallucinations-MOM   Henoch-Schonlein purpura    History of kidney stones    History of UTI    Hypertension    IBS (irritable bowel syndrome)    Obesity    Sleep apnea     Past Surgical History:  Procedure Laterality Date   ABDOMINAL HYSTERECTOMY  2003   ACHILLES TENDON SURGERY Left    CESAREAN SECTION     CHOLECYSTECTOMY N/A 01/03/2020   Procedure: LAPAROSCOPIC CHOLECYSTECTOMY;  Surgeon: Kallie Manuelita BROCKS, MD;  Location: [facility];  Service: General;  Laterality: N/A;   CYSTOSCOPY/URETEROSCOPY/HOLMIUM LASER/STENT PLACEMENT Left 01/08/2024   Procedure: CYSTOSCOPY/URETEROSCOPY/HOLMIUM LASER/STENT PLACEMENT;  Surgeon: Shane Steffan BROCKS, MD;  Location: [facility];  Service: Urology;  Laterality: Left;   KNEE SURGERY Left    arthroscopic   NECK SURGERY  2000   cervical fusion 6/7   REPLACEMENT TOTAL KNEE Left 2019   [facility]   TONSILECTOMY, ADENOIDECTOMY, BILATERAL MYRINGOTOMY AND TUBES     TONSILLECTOMY     TUBAL LIGATION  1996    Social History   Socioeconomic History   Marital status: Married    Spouse name: Not on file   Number of children: 3   Years of education: some college   Highest education level: Not on file  Occupational History   Occupation: unemployed  Tobacco Use   Smoking status: Never    Passive exposure: Never   Smokeless tobacco: Never   Vaping Use   Vaping status: Never Used  Substance and Sexual Activity   Alcohol use: Yes    Alcohol/week: 1.0 standard drink of alcohol    Types: 1 Glasses of wine per week    Comment: 2 a month    Drug use: No   Sexual activity: Yes    Birth control/protection: Surgical  Other Topics Concern   Not on file  Social History Narrative   Lives with spouse   2 caffeine drinks daily    Social Drivers of Health   Financial Resource Strain: Low Risk  (06/01/2023)   Received from [facility]   Overall Financial Resource Strain (CARDIA)    Difficulty of Paying Living Expenses: Not hard at all  Food Insecurity: No Food Insecurity (06/01/2023)   [facility]   Hunger Vital Sign    Within the past 12 months, you worried that your food would run out before you got the money to buy more.: Never true    Within the past 12 months,  the food you bought just didn't last and you didn't have money to get more.: Never true  Transportation Needs: No Transportation Needs (06/01/2023)   Received from [facility]   PRAPARE - Transportation    Lack of Transportation (Medical): No    Lack of Transportation (Non-Medical): No  Physical Activity: Insufficiently Active (06/01/2023)   Received from [facility]   Exercise Vital Sign    On average, how many days per week do you engage in moderate to strenuous exercise (like a brisk walk)?: 1 day    On average, how many minutes do you engage in exercise at this level?: 20 min  Stress: No Stress Concern Present (06/01/2023)   Received from [facility] - Occupational Stress Questionnaire    Feeling of Stress : Not at all  Social Connections: Socially Integrated (06/01/2023)   Received from [facility]   Social Network    How would you rate your social network (family, work, friends)?: Good participation with social networks    Family History  Problem Relation Age of Onset   Leukemia Mother     Diabetes Mother    Breast cancer Mother    Hypertension Mother    Stroke Mother    Esophageal cancer Brother    Diabetes Maternal Aunt    Diabetes Maternal Uncle    Diabetes Maternal Grandfather    Heart attack Father    Heart failure Father    Colon cancer Neg Hx     Outpatient Encounter Medications as of 07/25/2024  Medication Sig   tirzepatide (MOUNJARO) 2.5 MG/0.5ML Pen Inject 2.5 mg into the skin once a week.   ACCU-CHEK GUIDE TEST test strip USE TO TEST BLOOD SUGAR FOUR TIMES DAILY AS DIRECTED.   Accu-Chek Softclix Lancets lancets Use as instructed   acetaminophen  (TYLENOL ) 500 MG tablet Take 1,000 mg by mouth 2 (two) times daily as needed for headache.   Blood Glucose Monitoring Suppl (ACCU-CHEK GUIDE ME) w/Device KIT 1 Piece by Does not apply route as directed.   calcium  carbonate (TUMS - DOSED IN MG ELEMENTAL CALCIUM ) 500 MG chewable tablet Chew 1 tablet by mouth daily as needed for indigestion or heartburn.   cholecalciferol (VITAMIN D3) 25 MCG (1000 UNIT) tablet Take 2,000 Units by mouth daily.   dicyclomine (BENTYL) 10 MG capsule Take 10 mg by mouth 2 (two) times daily.    ibuprofen (ADVIL,MOTRIN) 200 MG tablet Take 600 mg by mouth 2 (two) times daily as needed for moderate pain.   insulin  degludec (TRESIBA  FLEXTOUCH) 100 UNIT/ML FlexTouch Pen Inject 50 Units into the skin at bedtime.   Insulin  Pen Needle (BD PEN NEEDLE SHORT ULTRAFINE) 31G X 8 MM MISC USE ONCE DAILY   JARDIANCE  25 MG TABS tablet TAKE 1 TABLET(25 MG) BY MOUTH DAILY WITH BREAKFAST   losartan -hydrochlorothiazide (HYZAAR) 50-12.5 MG tablet Take 1 tablet by mouth daily.   rosuvastatin  (CRESTOR ) 10 MG tablet TAKE 1 TABLET(10 MG) BY MOUTH DAILY   tolterodine (DETROL) 2 MG tablet Take 2 mg by mouth 2 (two) times daily.   No facility-administered encounter medications on file as of 07/25/2024.    ALLERGIES: Allergies  Allergen Reactions   Hydromorphone  Hcl Nausea And Vomiting   Sulfa  Antibiotics Nausea And  Vomiting   Cephalexin Hives   Ciprocin-Fluocin-Procin [Fluocinolone Acetonide] Hives   Bee Venom Swelling    REACTION: Severe swelling   Levofloxacin Hives   Celebrex [Celecoxib] Rash    itching   Penicillins  Rash    Did it involve swelling of the face/tongue/throat, SOB, or low BP? No Did it involve sudden or severe rash/hives, skin peeling, or any reaction on the inside of your mouth or nose? Yes Did you need to seek medical attention at a hospital or doctor's office? Unknown When did it last happen? childhood       If all above answers are "NO", may proceed with cephalosporin use.     VACCINATION STATUS:  There is no immunization history on file for this patient.  Diabetes She presents for her follow-up diabetic visit. She has type 2 diabetes mellitus. Her disease course has been stable. There are no hypoglycemic associated symptoms. Pertinent negatives for hypoglycemia include no confusion, headaches, pallor or seizures. Pertinent negatives for diabetes include no chest pain, no fatigue, no polydipsia, no polyphagia and no polyuria. There are no hypoglycemic complications. Symptoms are stable. There are no diabetic complications. Risk factors for coronary artery disease include dyslipidemia, diabetes mellitus, family history, hypertension, obesity, post-menopausal and sedentary lifestyle. Current diabetic treatment includes insulin  injections and oral agent (monotherapy). Her weight is decreasing steadily. She is following a generally unhealthy diet. When asked about meal planning, she reported none. She has not had a previous visit with a dietitian. She participates in exercise intermittently. Her home blood glucose trend is fluctuating minimally. Her breakfast blood glucose range is generally 130-140 mg/dl. Her bedtime blood glucose range is generally 140-180 mg/dl. Her overall blood glucose range is 140-180 mg/dl. (She presents with increasing glycemic profile,  with point-of-care A1c of  7%.  Improved and controlled glycemic profile averaging 144 for the last 30 days.  Her point-of-care A1c 6.6% improved from 7.5%.    She did not document hypoglycemia.    ) An ACE inhibitor/angiotensin II receptor blocker is being taken. Eye exam is current.  Hypertension This is a chronic problem. The current episode started more than 1 year ago. The problem is controlled. Pertinent negatives include no chest pain, headaches, palpitations or shortness of breath. Risk factors for coronary artery disease include diabetes mellitus, dyslipidemia, family history, obesity, post-menopausal state and sedentary lifestyle. Past treatments include angiotensin blockers.  Hyperlipidemia This is a chronic problem. The current episode started more than 1 year ago. The problem is uncontrolled. Pertinent negatives include no chest pain, myalgias or shortness of breath. She is currently on no antihyperlipidemic treatment. Risk factors for coronary artery disease include diabetes mellitus, dyslipidemia, family history, hypertension, obesity, a sedentary lifestyle and post-menopausal.     Objective:       07/25/2024    2:54 PM 06/11/2024    3:25 PM 05/13/2024    2:51 PM  Vitals with BMI  Height 5' 4 5' 4 5' 4  Weight 199 lbs 6 oz 203 lbs 204 lbs  BMI 34.21 34.83 35  Systolic 110 103 891  Diastolic 68 70 64  Pulse 68 77 67    BP 110/68   Pulse 68   Ht 5' 4 (1.626 m)   Wt 199 lb 6.4 oz (90.4 kg)   BMI 34.23 kg/m   Wt Readings from Last 3 Encounters:  07/25/24 199 lb 6.4 oz (90.4 kg)  06/11/24 203 lb (92.1 kg)  05/13/24 204 lb (92.5 kg)       CMP ( most recent) CMP     Component Value Date/Time   NA 136 01/07/2024 1834   NA 140 07/14/2023 1037   K 4.1 01/07/2024 1834   CL 106 01/07/2024 1834  CO2 21 (L) 01/07/2024 1834   GLUCOSE 136 (H) 01/07/2024 1834   BUN 32 (H) 01/07/2024 1834   BUN 15 07/14/2023 1037   CREATININE 1.05 (H) 01/07/2024 1834   CALCIUM  9.0 01/07/2024 1834   PROT  7.2 12/23/2023 0626   PROT 7.0 07/14/2023 1037   ALBUMIN 3.7 12/23/2023 0626   ALBUMIN 4.2 07/14/2023 1037   AST 17 12/23/2023 0626   ALT 18 12/23/2023 0626   ALKPHOS 56 12/23/2023 0626   BILITOT 0.7 12/23/2023 0626   BILITOT 0.4 07/14/2023 1037   GFRNONAA >60 01/07/2024 1834   GFRAA 114 11/23/2020 1353     Diabetic Labs (most recent): Lab Results  Component Value Date   HGBA1C 7.0 07/25/2024   HGBA1C 6.8 07/25/2023   HGBA1C 6.5 01/23/2023   MICROALBUR 80 11/26/2020     Lipid Panel ( most recent) Lipid Panel     Component Value Date/Time   CHOL 137 07/14/2023 1037   TRIG 131 07/14/2023 1037   HDL 43 07/14/2023 1037   CHOLHDL 3.2 07/14/2023 1037   CHOLHDL 5.7 01/27/2014 0556   VLDL 24 01/27/2014 0556   LDLCALC 71 07/14/2023 1037   LABVLDL 23 07/14/2023 1037      Lab Results  Component Value Date   TSH 3.010 07/14/2023   TSH 3.650 06/17/2022   TSH 2.640 11/23/2020   TSH 1.738 11/27/2010   FREET4 1.14 07/14/2023   FREET4 0.97 06/17/2022   FREET4 1.07 11/23/2020      Assessment & Plan:   1. Uncontrolled type 2 diabetes mellitus with hyperglycemia (HCC) - Victoria Williams has currently uncontrolled symptomatic type 2 DM since  58 years of age.  She presents with average blood glucose of 147-151 for the most recent month.  Her point-of-care A1c is 7% increasing from 6.6%.    She did not document hypoglycemia.      Recent labs reviewed. - I had a long discussion with her about the progressive nature of diabetes and the pathology behind its complications. -She did not report gross complications from her diabetes, however she remains at a high risk for more acute and chronic complications which include CAD, CVA, CKD, retinopathy, and neuropathy. These are all discussed in detail with her.  - I have counseled her on diet  and weight management  by adopting a carbohydrate restricted/protein rich diet. Patient is encouraged to switch to  unprocessed or minimally  processed     complex starch and increased protein intake (animal or plant source), fruits, and vegetables. -  she is advised to stick to a routine mealtimes to eat 3 meals  a day and avoid unnecessary snacks ( to snack only to correct hypoglycemia).   - she acknowledges that there is a room for improvement in her food and drink choices. - Suggestion is made for her to avoid simple carbohydrates  from her diet including Cakes, Sweet Desserts, Ice Cream, Soda (diet and regular), Sweet Tea, Candies, Chips, Cookies, Store Bought Juices, Alcohol , Artificial Sweeteners,  Coffee Creamer, and Sugar-free Products, Lemonade. This will help patient to have more stable blood glucose profile and potentially avoid unintended weight gain.  The following Lifestyle Medicine recommendations according to American College of Lifestyle Medicine  [facility]) were discussed and and offered to patient and she  agrees to start the journey:  A. Whole Foods, Plant-Based Nutrition comprising of fruits and vegetables, plant-based proteins, whole-grain carbohydrates was discussed in detail with the patient.   A list for source of  those nutrients were also provided to the patient.  Patient will use only water  or unsweetened tea for hydration. B.  The need to stay away from risky substances including alcohol, smoking; obtaining 7 to 9 hours of restorative sleep, at least 150 minutes of moderate intensity exercise weekly, the importance of healthy social connections,  and stress management techniques were discussed. C.  A full color page of  Calorie density of various food groups per pound showing examples of each food groups was provided to the patient.  - she has been scheduled with Penny Crumpton, RDN, CDE for diabetes education.  - I have approached her with the following individualized plan to manage  her diabetes and patient agrees:   -In light of her presentation with near target glycemic profile, she will not need prandial  insulin  for now.   - She will continue to need basal insulin .  She is advised to continue Tresiba  50 units nightly associated with monitoring of blood glucose twice a day-daily before breakfast and bedtime.    -She did not want to get on a CGM.    - she is encouraged to call clinic for blood glucose levels less than 70 or above 200 mg /dl. -Whole Foods plant predominant lifestyle nutrition was discussed and recommended to her, she is engaged partially, later with weight loss.  She is advised to continue Jardiance  25 mg p.o. daily at breakfast.  Side effects and precautions discussed with her.   - This patient is approached again for GLP-1 receptor agonist intervention.  She is open for Mounjaro because her husband takes it.  I discussed and initiated Mounjaro 2.5 mg subcutaneously weekly.  This medication will be advanced as she tolerates.  - Specific targets for  A1c;  LDL, HDL,  and Triglycerides were discussed with the patient.  2) Blood Pressure /Hypertension:  -Her blood pressure is controlled to target.  She has a slightly above target urinary microalbumin.  she is advised to continue her current medications including losartan /hydrochlorothiazide 50-12.5 mg p.o. daily at breakfast.    3) Lipids/Hyperlipidemia: Review of her previsit labs show controlled LDL at 71.  She is advised to continue Crestor  10 mg p.o. nightly.      The above mentioned whole food plant-based diet plan will also help with dyslipidemia.     4)  Weight/Diet:  Body mass index is 34.23 kg/m.  -   clearly complicating her diabetes care.   she is  a candidate for weight loss. I discussed with her the fact that loss of 5 - 10% of her  current body weight will have the most impact on her diabetes management.  Exercise, and detailed carbohydrates information provided  -  detailed on discharge instructions.  5) Chronic Care/Health Maintenance:  -she  ison ACEI/ARB and Statin medications and  is encouraged to initiate and  continue to follow up with Ophthalmology, Dentist,  Podiatrist at least yearly or according to recommendations, and advised to  stay away from smoking. I have recommended yearly flu vaccine and pneumonia vaccine at least every 5 years; moderate intensity exercise for up to 150 minutes weekly; and  sleep for at least 7 hours a day. She is advised to stay on vitamin D3 2000 units p.o. daily. Her screening ABI was negative for PAD in February 2022.    - she is  advised to maintain close follow up with Marvine Rush, MD for primary care needs, as well as her other providers for optimal and coordinated  care.  I spent  40  minutes in the care of the patient today including review of labs from CMP, Lipids, Thyroid  Function, Hematology (current and previous including abstractions from other facilities); face-to-face time discussing  her blood glucose readings/logs, discussing hypoglycemia and hyperglycemia episodes and symptoms, medications doses, her options of short and long term treatment based on the latest standards of care / guidelines;  discussion about incorporating lifestyle medicine;  and documenting the encounter. Risk reduction counseling performed per USPSTF guidelines to reduce  obesity and cardiovascular risk factors.     Please refer to Patient Instructions for Blood Glucose Monitoring and Insulin /Medications Dosing Guide  in media tab for additional information. Please  also refer to  Patient Self Inventory in the Media  tab for reviewed elements of pertinent patient history.  Logan HERO Ferrer participated in the discussions, expressed understanding, and voiced agreement with the above plans.  All questions were answered to her satisfaction. she is encouraged to contact clinic should she have any questions or concerns prior to her return visit.    Follow up plan: - Return in about 4 months (around 11/25/2024) for F/U with Pre-visit Labs, Meter/CGM/Logs, A1c here.  Ranny Earl, MD [facility] [address] [place], [place] [address] Phone: [phone]  Fax: [phone]    07/25/2024, 3:55 PM  This note was partially dictated with voice recognition software. Similar sounding words can be transcribed inadequately or may not  be corrected upon review.

## 2024-07-25 NOTE — Patient Instructions (Signed)

## 2024-08-12 DIAGNOSIS — B353 Tinea pedis: Secondary | ICD-10-CM | POA: Diagnosis not present

## 2024-08-18 ENCOUNTER — Other Ambulatory Visit: Payer: Self-pay | Admitting: "Endocrinology

## 2024-09-14 DIAGNOSIS — G4733 Obstructive sleep apnea (adult) (pediatric): Secondary | ICD-10-CM | POA: Diagnosis not present

## 2024-09-25 DIAGNOSIS — E119 Type 2 diabetes mellitus without complications: Secondary | ICD-10-CM | POA: Diagnosis not present

## 2024-09-25 DIAGNOSIS — I10 Essential (primary) hypertension: Secondary | ICD-10-CM | POA: Diagnosis not present

## 2024-09-25 DIAGNOSIS — Z862 Personal history of diseases of the blood and blood-forming organs and certain disorders involving the immune mechanism: Secondary | ICD-10-CM | POA: Diagnosis not present

## 2024-10-14 DIAGNOSIS — G4733 Obstructive sleep apnea (adult) (pediatric): Secondary | ICD-10-CM | POA: Diagnosis not present

## 2024-10-15 ENCOUNTER — Other Ambulatory Visit: Payer: Self-pay

## 2024-10-15 ENCOUNTER — Other Ambulatory Visit: Payer: Self-pay | Admitting: "Endocrinology

## 2024-10-15 MED ORDER — TIRZEPATIDE 5 MG/0.5ML ~~LOC~~ SOAJ: 5.0000 mg | SUBCUTANEOUS | 0 refills | Status: DC

## 2024-10-15 MED ORDER — TIRZEPATIDE 5 MG/0.5ML ~~LOC~~ SOAJ: 5.0000 mg | SUBCUTANEOUS | 0 refills | Status: AC

## 2024-10-15 NOTE — Telephone Encounter (Signed)
 Mounjaro  was sent to [facility] instead of [facility] in [place], resent new Rx to [facility].

## 2024-10-15 NOTE — Telephone Encounter (Signed)
 Pt called back and lvm up front for you to give her a call back

## 2024-10-15 NOTE — Telephone Encounter (Signed)
 Patient called needing refill on Mounjaro  2.5 mg, per your last note, if patient tolerated dose she may increase to next dose. Do you want to send the 5 mg of Mounjaro ? Please advise? Patient is unable to get next dose of medication until 10/19/24, and wants to get the Rx sent in before insurance deadline.

## 2024-10-23 ENCOUNTER — Other Ambulatory Visit: Payer: Self-pay | Admitting: "Endocrinology

## 2024-11-13 LAB — LAB REPORT - SCANNED: TSH: 2.1 (ref 0.41–5.90)

## 2024-11-22 ENCOUNTER — Other Ambulatory Visit: Payer: Self-pay | Admitting: "Endocrinology

## 2024-11-26 ENCOUNTER — Ambulatory Visit: Admitting: "Endocrinology

## 2024-11-26 ENCOUNTER — Encounter: Payer: Self-pay | Admitting: "Endocrinology

## 2024-11-26 VITALS — BP 92/64 | HR 64 | Ht 64.0 in | Wt 183.6 lb

## 2024-11-26 DIAGNOSIS — E782 Mixed hyperlipidemia: Secondary | ICD-10-CM | POA: Diagnosis not present

## 2024-11-26 DIAGNOSIS — E559 Vitamin D deficiency, unspecified: Secondary | ICD-10-CM | POA: Diagnosis not present

## 2024-11-26 DIAGNOSIS — E1165 Type 2 diabetes mellitus with hyperglycemia: Secondary | ICD-10-CM | POA: Diagnosis not present

## 2024-11-26 DIAGNOSIS — Z7984 Long term (current) use of oral hypoglycemic drugs: Secondary | ICD-10-CM

## 2024-11-26 DIAGNOSIS — I10 Essential (primary) hypertension: Secondary | ICD-10-CM

## 2024-11-26 DIAGNOSIS — Z7985 Long-term (current) use of injectable non-insulin antidiabetic drugs: Secondary | ICD-10-CM | POA: Diagnosis not present

## 2024-11-26 DIAGNOSIS — Z794 Long term (current) use of insulin: Secondary | ICD-10-CM | POA: Diagnosis not present

## 2024-11-26 LAB — POCT GLYCOSYLATED HEMOGLOBIN (HGB A1C)

## 2024-11-26 MED ORDER — TRESIBA FLEXTOUCH 100 UNIT/ML ~~LOC~~ SOPN: 30.0000 [IU] | PEN_INJECTOR | Freq: Every day | SUBCUTANEOUS | 1 refills | Status: DC

## 2024-11-26 MED ORDER — TRESIBA FLEXTOUCH 100 UNIT/ML ~~LOC~~ SOPN: 30.0000 [IU] | PEN_INJECTOR | Freq: Every day | SUBCUTANEOUS | 1 refills | Status: AC

## 2024-11-26 NOTE — Patient Instructions (Signed)

## 2025-03-27 ENCOUNTER — Ambulatory Visit: Admitting: "Endocrinology
